# Patient Record
Sex: Female | Born: 1990 | Race: Black or African American | Hispanic: No | Marital: Single | Smoking: Never smoker
Health system: Southern US, Community
[De-identification: ages and names within clinical notes are randomized; demographics above are authoritative.]

## PROBLEM LIST (undated history)

## (undated) ENCOUNTER — Inpatient Hospital Stay (HOSPITAL_COMMUNITY): Payer: Self-pay

## (undated) DIAGNOSIS — J45909 Unspecified asthma, uncomplicated: Secondary | ICD-10-CM

## (undated) DIAGNOSIS — R112 Nausea with vomiting, unspecified: Secondary | ICD-10-CM

## (undated) DIAGNOSIS — K8020 Calculus of gallbladder without cholecystitis without obstruction: Secondary | ICD-10-CM

## (undated) DIAGNOSIS — Z9889 Other specified postprocedural states: Secondary | ICD-10-CM

## (undated) HISTORY — DX: Calculus of gallbladder without cholecystitis without obstruction: K80.20

## (undated) HISTORY — PX: WISDOM TOOTH EXTRACTION: SHX21

---
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
## 2011-10-11 DIAGNOSIS — IMO0002 Reserved for concepts with insufficient information to code with codable children: Secondary | ICD-10-CM | POA: Insufficient documentation

## 2011-10-11 DIAGNOSIS — Z880 Allergy status to penicillin: Secondary | ICD-10-CM | POA: Insufficient documentation

## 2011-10-11 DIAGNOSIS — M542 Cervicalgia: Secondary | ICD-10-CM | POA: Insufficient documentation

## 2011-10-11 DIAGNOSIS — R51 Headache: Secondary | ICD-10-CM | POA: Insufficient documentation

## 2011-10-11 DIAGNOSIS — J45909 Unspecified asthma, uncomplicated: Secondary | ICD-10-CM | POA: Insufficient documentation

## 2011-10-12 ENCOUNTER — Emergency Department (HOSPITAL_COMMUNITY)
Admission: EM | Admit: 2011-10-12 | Discharge: 2011-10-12 | Disposition: A | Discharge status: Home / Self Care | Payer: No Typology Code available for payment source | Attending: Emergency Medicine | Admitting: Emergency Medicine

## 2011-10-12 ENCOUNTER — Emergency Department (HOSPITAL_COMMUNITY)
Admit: 2011-10-12 | Discharge: 2011-10-12 | Disposition: A | Discharge status: Home / Self Care | Payer: No Typology Code available for payment source | Attending: Emergency Medicine | Admitting: Emergency Medicine

## 2011-10-12 ENCOUNTER — Encounter (HOSPITAL_COMMUNITY): Payer: Self-pay | Admitting: Emergency Medicine

## 2011-10-12 DIAGNOSIS — S0083XA Contusion of other part of head, initial encounter: Secondary | ICD-10-CM

## 2011-10-12 DIAGNOSIS — IMO0002 Reserved for concepts with insufficient information to code with codable children: Secondary | ICD-10-CM

## 2011-10-12 HISTORY — DX: Unspecified asthma, uncomplicated: J45.909

## 2011-10-12 MED ORDER — IBUPROFEN 600 MG PO TABS: 600.0000 mg | ORAL_TABLET | Freq: Four times a day (QID) | ORAL | Status: AC | PRN

## 2011-10-12 MED ORDER — IBUPROFEN 400 MG PO TABS
600.0000 mg | ORAL_TABLET | Freq: Once | ORAL | Status: AC
  Administered 2011-10-12: 600 mg via ORAL
  Filled 2011-10-12: qty 1
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
## 2011-10-12 NOTE — ED Provider Notes (Signed)
Medical screening examination/treatment/procedure(s) were performed by non-physician practitioner and as supervising physician I was immediately available for consultation/collaboration.   Hanley Seamen, MD 10/12/11 [phone]

## 2011-10-14 ENCOUNTER — Encounter (HOSPITAL_COMMUNITY): Payer: Self-pay | Admitting: *Deleted

## 2011-10-14 ENCOUNTER — Emergency Department (INDEPENDENT_AMBULATORY_CARE_PROVIDER_SITE_OTHER)
Admission: EM | Admit: 2011-10-14 | Discharge: 2011-10-14 | Disposition: A | Discharge status: Home / Self Care | Payer: BC Managed Care – PPO | Source: Home / Self Care | Attending: Emergency Medicine | Admitting: Emergency Medicine

## 2011-10-14 DIAGNOSIS — K120 Recurrent oral aphthae: Secondary | ICD-10-CM

## 2011-10-14 DIAGNOSIS — S39012A Strain of muscle, fascia and tendon of lower back, initial encounter: Secondary | ICD-10-CM

## 2011-10-14 DIAGNOSIS — S335XXA Sprain of ligaments of lumbar spine, initial encounter: Secondary | ICD-10-CM

## 2011-10-14 MED ORDER — TIZANIDINE HCL 4 MG PO TABS: 4.0000 mg | ORAL_TABLET | Freq: Two times a day (BID) | ORAL | Status: AC

## 2011-10-14 MED ORDER — DIPHENHYDRAMINE HCL 12.5 MG/5ML PO LIQD: 12.5000 mg | Freq: Four times a day (QID) | ORAL | Status: DC | PRN

## 2011-10-14 MED ORDER — HYDROCODONE-ACETAMINOPHEN 5-500 MG PO TABS: 1.0000 | ORAL_TABLET | Freq: Four times a day (QID) | ORAL | Status: DC | PRN

## 2011-10-14 MED ORDER — LIDOCAINE VISCOUS 2 % MT SOLN: 10.0000 mL | Freq: Three times a day (TID) | OROMUCOSAL | Status: AC | PRN

## 2011-10-14 MED ORDER — ALUMINUM & MAGNESIUM HYDROXIDE 600-300 MG/5ML PO CONC: ORAL | Status: AC
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
## 2011-12-06 ENCOUNTER — Encounter (HOSPITAL_COMMUNITY): Payer: Self-pay | Admitting: Emergency Medicine

## 2011-12-06 ENCOUNTER — Emergency Department (HOSPITAL_COMMUNITY)
Admission: EM | Admit: 2011-12-06 | Discharge: 2011-12-06 | Disposition: A | Discharge status: Home / Self Care | Payer: BC Managed Care – PPO | Source: Home / Self Care | Attending: Emergency Medicine | Admitting: Emergency Medicine

## 2011-12-06 DIAGNOSIS — L509 Urticaria, unspecified: Secondary | ICD-10-CM

## 2011-12-06 MED ORDER — METHYLPREDNISOLONE ACETATE 80 MG/ML IJ SUSP
INTRAMUSCULAR | Status: AC
  Filled 2011-12-06: qty 1

## 2011-12-06 MED ORDER — DIPHENHYDRAMINE HCL 50 MG/ML IJ SOLN
50.0000 mg | Freq: Once | INTRAMUSCULAR | Status: AC
  Administered 2011-12-06: 50 mg via INTRAMUSCULAR

## 2011-12-06 MED ORDER — FEXOFENADINE HCL 180 MG PO TABS: 180.0000 mg | ORAL_TABLET | Freq: Every day | ORAL | Status: DC

## 2011-12-06 MED ORDER — DIPHENHYDRAMINE HCL 50 MG/ML IJ SOLN
INTRAMUSCULAR | Status: AC
  Filled 2011-12-06: qty 1

## 2011-12-06 MED ORDER — CIMETIDINE 400 MG PO TABS: 400.0000 mg | ORAL_TABLET | Freq: Two times a day (BID) | ORAL | Status: DC

## 2011-12-06 MED ORDER — PREDNISONE 10 MG PO TABS: ORAL_TABLET | ORAL | Status: DC

## 2011-12-06 MED ORDER — METHYLPREDNISOLONE ACETATE 80 MG/ML IJ SUSP
80.0000 mg | Freq: Once | INTRAMUSCULAR | Status: AC
  Administered 2011-12-06: 80 mg via INTRAMUSCULAR
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
## 2011-12-06 NOTE — ED Notes (Signed)
Pt c/o rash x2 days... Sx include: rash on lower back, lower abd, knees, and arms... Denies: fevers, vomiting, nausea, diarrhea... Does not recall any new personal hygiene products, did not eat anything new... Pt is alert w/some discomfort due to the itching... Pt is sitting up right on exam table.

## 2012-03-05 ENCOUNTER — Emergency Department (HOSPITAL_COMMUNITY)
Admission: EM | Admit: 2012-03-05 | Discharge: 2012-03-05 | Disposition: A | Discharge status: Home / Self Care | Payer: BC Managed Care – PPO | Source: Home / Self Care

## 2012-03-05 ENCOUNTER — Encounter (HOSPITAL_COMMUNITY): Payer: Self-pay | Admitting: Emergency Medicine

## 2012-03-05 DIAGNOSIS — J020 Streptococcal pharyngitis: Secondary | ICD-10-CM

## 2012-03-05 MED ORDER — DEXAMETHASONE SODIUM PHOSPHATE 10 MG/ML IJ SOLN
10.0000 mg | Freq: Once | INTRAMUSCULAR | Status: AC
  Administered 2012-03-05: 10 mg via INTRAMUSCULAR

## 2012-03-05 MED ORDER — AZITHROMYCIN 250 MG PO TABS: 250.0000 mg | ORAL_TABLET | Freq: Every day | ORAL | Status: DC

## 2012-03-05 MED ORDER — DEXAMETHASONE SODIUM PHOSPHATE 10 MG/ML IJ SOLN
INTRAMUSCULAR | Status: AC
  Filled 2012-03-05: qty 1

## 2012-03-05 NOTE — ED Provider Notes (Signed)
History     CSN: [id number]  Arrival date & time 03/05/12  1105   None     Chief Complaint  Patient presents with  . Sore Throat    sore throat since monday. dry nonproductive cough. voice goes in and out.     HPI: Patient is a 22 y.o. female presenting with pharyngitis. The history is provided by the patient.  Sore Throat This is a recurrent problem. The current episode started more than 2 days ago. The problem occurs constantly. The problem has been gradually worsening. Pertinent negatives include no chest pain and no shortness of breath. The symptoms are aggravated by swallowing. Nothing relieves the symptoms.  Pt reports onset of sore throat Monday that has gradually worsened. Other symptoms include mild dry cough and intermittent runny nose. Denies fever. Admits to h/o strep throat.  Past Medical History  Diagnosis Date  . Asthma     Past Surgical History  Procedure Date  . Wisdom tooth extraction     Family History  Problem Relation Age of Onset  . Diabetes Mother   . Hypertension Mother   . Heart failure Mother     History  Substance Use Topics  . Smoking status: Never Smoker   . Smokeless tobacco: Not on file  . Alcohol Use: No    OB History    Grav Para Term Preterm Abortions TAB SAB Ect Mult Living                  Review of Systems  Constitutional: Negative.   HENT: Positive for congestion and rhinorrhea. Negative for ear pain, facial swelling, neck stiffness and ear discharge.   Eyes: Negative for pain, discharge and redness.  Respiratory: Positive for cough. Negative for chest tightness, shortness of breath, wheezing and stridor.   Cardiovascular: Negative for chest pain.  Gastrointestinal: Negative.   Genitourinary: Negative.   Musculoskeletal: Negative.   Neurological: Negative.   Hematological: Negative.   Psychiatric/Behavioral: Negative.     Allergies  Penicillins  Home Medications   Current Outpatient Rx  Name  Route  Sig   Dispense  Refill  . CIMETIDINE 400 MG PO TABS   Oral   Take 1 tablet (400 mg total) by mouth 2 (two) times daily.   30 tablet   0   . DIPHENHYDRAMINE HCL 12.5 MG/5ML PO LIQD   Oral   Take 5 mLs (12.5 mg total) by mouth 4 (four) times daily as needed. Mix 5 mL with 5 mL of Mylanta. Hold in mouth and swallow. Take the 10 mL q 4-6 hr prn pain   120 mL   0   . FEXOFENADINE HCL 180 MG PO TABS   Oral   Take 1 tablet (180 mg total) by mouth daily.   15 tablet   0   . HYDROCODONE-ACETAMINOPHEN 5-500 MG PO TABS   Oral   Take 1 tablet by mouth every 6 (six) hours as needed. For dental pain   20 tablet   0   . PREDNISONE 10 MG PO TABS      Take 4 tabs daily for 4 days, 3 tabs daily for 4 days, 2 tabs daily for 4 days, then 1 tab daily for 4 days.   40 tablet   0     BP 110/68  Pulse 82  Temp 98.3 F (36.8 C) (Oral)  SpO2 100%  Physical Exam  Constitutional: She is oriented to person, place, and time. She appears well-developed and well-nourished.  HENT:  Head: Normocephalic and atraumatic.  Right Ear: Tympanic membrane, external ear and ear canal normal.  Left Ear: Tympanic membrane, external ear and ear canal normal.  Nose: Right sinus exhibits no maxillary sinus tenderness and no frontal sinus tenderness. Left sinus exhibits no maxillary sinus tenderness and no frontal sinus tenderness.  Mouth/Throat: Mucous membranes are normal. Posterior oropharyngeal edema and posterior oropharyngeal erythema present. No oropharyngeal exudate.       Tonsils 2-3+ bil  Eyes: Conjunctivae normal are normal.  Neck: Neck supple.  Cardiovascular: Normal rate.   Pulmonary/Chest: Effort normal and breath sounds normal.  Musculoskeletal: Normal range of motion.  Lymphadenopathy:    She has no cervical adenopathy.  Neurological: She is alert and oriented to person, place, and time.  Skin: Skin is warm and dry.  Psychiatric: She has a normal mood and affect.    ED Course  Procedures  (including critical care time)   Labs Reviewed  POCT RAPID STREP A ([facility] ONLY)   No results found.   No diagnosis found.    MDM  Sore throat since Monday. H/o strep pharyngitis. Rapid strep positive. PCN allergy. Z-Pak and Dexamethazone IM.        Leanne Chang, NP 03/05/12 1157

## 2012-03-05 NOTE — ED Notes (Signed)
Pt c/o sore throat with a dry non productive cough since Monday. Pt states that her voices goes in and out. Pt denies fever, n/v/d. Pt has been taking nyquill with no relief in symptoms.

## 2012-03-07 NOTE — ED Provider Notes (Signed)
Medical screening examination/treatment/procedure(s) were performed by resident physician or non-physician practitioner and as supervising physician I was immediately available for consultation/collaboration.   Turkessa Ostrom DOUGLAS MD.    Makya Phillis D Makalah Asberry, MD 03/07/12 1843 

## 2012-12-14 ENCOUNTER — Emergency Department (INDEPENDENT_AMBULATORY_CARE_PROVIDER_SITE_OTHER)
Admission: EM | Admit: 2012-12-14 | Discharge: 2012-12-14 | Disposition: A | Discharge status: Home / Self Care | Payer: No Typology Code available for payment source | Source: Home / Self Care | Attending: Family Medicine | Admitting: Family Medicine

## 2012-12-14 ENCOUNTER — Emergency Department (INDEPENDENT_AMBULATORY_CARE_PROVIDER_SITE_OTHER): Payer: No Typology Code available for payment source

## 2012-12-14 ENCOUNTER — Encounter (HOSPITAL_COMMUNITY): Payer: Self-pay | Admitting: Emergency Medicine

## 2012-12-14 DIAGNOSIS — R002 Palpitations: Secondary | ICD-10-CM

## 2012-12-14 DIAGNOSIS — F411 Generalized anxiety disorder: Secondary | ICD-10-CM

## 2012-12-14 DIAGNOSIS — F419 Anxiety disorder, unspecified: Secondary | ICD-10-CM

## 2012-12-14 MED ORDER — ALPRAZOLAM 0.25 MG PO TABS: 0.2500 mg | ORAL_TABLET | Freq: Three times a day (TID) | ORAL | Status: DC | PRN

## 2012-12-14 NOTE — ED Notes (Addendum)
Pt c/o chest pain/discomfort onset yest night Sxs include: nausea, SOB, palpitations, pain that increased when lying on her left side Denies: f/v/d, weakness, dizziness, HA Recently got over an ear infection.... Took her last antibiotic this am. Reports she's feeling much better than yest night.  She is alert and talking in complete sentences w/no signs of acute distress.
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
## 2012-12-20 NOTE — ED Provider Notes (Signed)
Medical screening examination/treatment/procedure(s) were performed by resident physician or non-physician practitioner and as supervising physician I was immediately available for consultation/collaboration.   Barkley Bruns MD.   Linna Hoff, MD 12/20/12 [phone]

## 2013-01-23 ENCOUNTER — Encounter (HOSPITAL_COMMUNITY): Payer: Self-pay | Admitting: Emergency Medicine

## 2013-01-23 ENCOUNTER — Emergency Department (INDEPENDENT_AMBULATORY_CARE_PROVIDER_SITE_OTHER)
Admission: EM | Admit: 2013-01-23 | Discharge: 2013-01-23 | Disposition: A | Discharge status: Home / Self Care | Payer: No Typology Code available for payment source | Source: Home / Self Care | Attending: Emergency Medicine | Admitting: Emergency Medicine

## 2013-01-23 ENCOUNTER — Other Ambulatory Visit (HOSPITAL_COMMUNITY)
Admission: RE | Admit: 2013-01-23 | Discharge: 2013-01-23 | Disposition: A | Discharge status: Home / Self Care | Payer: No Typology Code available for payment source | Source: Ambulatory Visit | Attending: Emergency Medicine | Admitting: Emergency Medicine

## 2013-01-23 DIAGNOSIS — Z113 Encounter for screening for infections with a predominantly sexual mode of transmission: Secondary | ICD-10-CM | POA: Insufficient documentation

## 2013-01-23 DIAGNOSIS — N898 Other specified noninflammatory disorders of vagina: Secondary | ICD-10-CM

## 2013-01-23 DIAGNOSIS — N939 Abnormal uterine and vaginal bleeding, unspecified: Secondary | ICD-10-CM

## 2013-01-23 DIAGNOSIS — N760 Acute vaginitis: Secondary | ICD-10-CM | POA: Insufficient documentation

## 2013-01-23 LAB — POCT PREGNANCY, URINE: Preg Test, Ur: NEGATIVE

## 2013-01-23 NOTE — ED Provider Notes (Signed)
Medical screening examination/treatment/procedure(s) were performed by a resident physician and as supervising physician I was immediately available for consultation/collaboration.  Additionally, I saw the patient independently, verified the history, examined the patient and discussed the treatment plan with the resident.  Leslee Home, M.D.   Reuben Likes, MD 01/23/13 [phone]

## 2013-01-23 NOTE — ED Notes (Signed)
C/o vaginal bleeding since last Thursday. Cramping at night and with lying down.  Nausea off/on for several weeks.  Uses depo for birth control states never had cycle with injection.  Denies any abnormal discharge and irritation.

## 2013-01-23 NOTE — ED Provider Notes (Signed)
CSN: [id number]     Arrival date & time 01/23/13  1322 History   First MD Initiated Contact with Patient 01/23/13 1438     Chief Complaint  Patient presents with  . Vaginal Bleeding   HPI 22 yo female who presents for evaluation of vaginal bleeding. Started on Friday (4 days ago) and has been spotting once or twice a day. She has had associated abdominal cramping and nausea. She denies any vaginal discharge, vaginal burning or itching. Denies any dysuria or increased urinary frequency. No fevers or chills. She denies any STD exposure. Denies any recent vaginal trauma.  She has been on depo provera for the last 4-5 years and has received it regularly without intervals of discontinuation. Her last depo shot was 3 months ago. She is due for her next dose this month. She has never had vaginal spotting or bleeding on depo before. Prior to receiving the depo shot, she used to have monthly, regular periods.   Past Medical History  Diagnosis Date  . Asthma    Past Surgical History  Procedure Laterality Date  . Wisdom tooth extraction     Family History  Problem Relation Age of Onset  . Diabetes Mother   . Hypertension Mother   . Heart failure Mother    History  Substance Use Topics  . Smoking status: Never Smoker   . Smokeless tobacco: Not on file  . Alcohol Use: No   OB History   Grav Para Term Preterm Abortions TAB SAB Ect Mult Living                 Review of Systems Negative except per HPI Allergies  Penicillins  Home Medications   Current Outpatient Rx  Name  Route  Sig  Dispense  Refill  . ALPRAZolam (XANAX) 0.25 MG tablet   Oral   Take 1 tablet (0.25 mg total) by mouth 3 (three) times daily as needed for sleep.   30 tablet   0    BP 135/78  Pulse 101  Temp(Src) 98 F (36.7 C) (Oral)  Resp 16  SpO2 99% Physical Exam General: no acute distress, pleasant, alert and oriented x4 Pelvic exam: normal external genitalia, vulva, vagina, cervix, uterus and adnexa. No  cervical motion tenderness, no adnexal tenderness, normal sized uterus.  Abdomen: soft, non distended, mild discomfort in epigastric region, otherwise no tenderness, no guarding, no rebound.  ED Course  Procedures (including critical care time) Labs Review Labs Reviewed  POCT PREGNANCY, URINE  CERVICOVAGINAL ANCILLARY ONLY    MDM   1. Vaginal bleeding    Spotting likely from depo-provera: Infectious etiology being ruled out with GC/Chl and wet prep. No evidence of PID on exam. Normal sized uterus making a grossly enlarged fibroid as the cause of bleeding less likely. UPT negative.  - Spotting will likely resolve on its own. Patient did not want provera treatment for bleeding. Patient to receive her depo as schedule. - over the counter ibuprofen for abdominal cramping - red flags for return to care reviewed: worsening vaginal bleeding, abdominal pain, fevers, poor po intake. - She was advised to return to her family medicine doctor in 1-2 weeks for close follow up.     Lonia Skinner, MD 01/23/13 [phone]

## 2013-01-25 ENCOUNTER — Telehealth (HOSPITAL_COMMUNITY): Payer: Self-pay | Admitting: Emergency Medicine

## 2013-01-25 ENCOUNTER — Telehealth (HOSPITAL_COMMUNITY): Payer: Self-pay | Admitting: *Deleted

## 2013-01-25 ENCOUNTER — Telehealth: Payer: Self-pay | Admitting: Family Medicine

## 2013-01-25 MED ORDER — FLUCONAZOLE 150 MG PO TABS: 150.0000 mg | ORAL_TABLET | Freq: Once | ORAL | Status: DC

## 2013-01-25 NOTE — ED Notes (Signed)
I called pt. Pt. verified x 2 and given results.  Pt. told she needs Diflucan for a yeast infection and where to pick up her Rx. Vassie Moselle 01/25/2013

## 2013-01-25 NOTE — ED Notes (Signed)
Her DNA probe came back positive for Candida. She will need treatment. Prescription sent in for Diflucan 150 mg, one tablet, one time only.  Reuben Likes, MD 01/25/13 [phone]

## 2013-01-25 NOTE — Telephone Encounter (Signed)
Called patient to let her know she had yeast infection and she had already been notified by urgent care RN.   Marena Chancy, PGY-3 Family Medicine Resident

## 2013-10-03 ENCOUNTER — Emergency Department (HOSPITAL_COMMUNITY)
Admission: EM | Admit: 2013-10-03 | Discharge: 2013-10-04 | Disposition: A | Discharge status: Home / Self Care | Payer: No Typology Code available for payment source | Attending: Emergency Medicine | Admitting: Emergency Medicine

## 2013-10-03 ENCOUNTER — Encounter (HOSPITAL_COMMUNITY): Payer: Self-pay | Admitting: Emergency Medicine

## 2013-10-03 DIAGNOSIS — IMO0001 Reserved for inherently not codable concepts without codable children: Secondary | ICD-10-CM | POA: Insufficient documentation

## 2013-10-03 DIAGNOSIS — J45909 Unspecified asthma, uncomplicated: Secondary | ICD-10-CM | POA: Diagnosis not present

## 2013-10-03 DIAGNOSIS — J029 Acute pharyngitis, unspecified: Secondary | ICD-10-CM | POA: Insufficient documentation

## 2013-10-03 DIAGNOSIS — Z880 Allergy status to penicillin: Secondary | ICD-10-CM | POA: Diagnosis not present

## 2013-10-03 LAB — RAPID STREP SCREEN (MED CTR MEBANE ONLY): STREPTOCOCCUS, GROUP A SCREEN (DIRECT): NEGATIVE

## 2013-10-03 NOTE — ED Provider Notes (Signed)
CSN: [id number]     Arrival date & time 10/03/13  2200 History  This chart was scribed for non-physician practitioner working with Richardean Canalavid H Yao, MD by Elveria Risingimelie Horne, ED Scribe. This patient was seen in room TR09C/TR09C and the patient's care was started at 11:07 PM.   Chief Complaint  Patient presents with  . Sore Throat    The history is provided by the patient. No language interpreter was used.   HPI Comments: Janice White is a 23 y.o. female who presents to the Emergency Department complaining of sore throat with hoarse voice, for three days. Patient reports associated rhinorrhea and myalgias. Patient reports history of strep; she states that her current symptoms are similar.  She denies sick contacts at home.  She reports attempted treatment with Theraflu, salt water gargles, and hot tea without relief.   Past Medical History  Diagnosis Date  . Asthma    Past Surgical History  Procedure Laterality Date  . Wisdom tooth extraction     Family History  Problem Relation Age of Onset  . Diabetes Mother   . Hypertension Mother   . Heart failure Mother    History  Substance Use Topics  . Smoking status: Never Smoker   . Smokeless tobacco: Not on file  . Alcohol Use: No   OB History   Grav Para Term Preterm Abortions TAB SAB Ect Mult Living                 Review of Systems  Constitutional: Negative for fever and chills.  HENT: Positive for rhinorrhea, sore throat and voice change. Negative for trouble swallowing.   Gastrointestinal: Negative for nausea and vomiting.  Musculoskeletal: Positive for myalgias.  All other systems reviewed and are negative.   Allergies  Penicillins  Home Medications   Prior to Admission medications   Medication Sig Start Date End Date Taking? Authorizing Provider  Phenylephrine-Pheniramine-DM [facility](THERAFLU COLD & COUGH PO) Take 1 packet by mouth daily as needed (cough / sore throat / cold).   Yes Historical Provider, MD   Triage Vitals:  BP 129/73  Pulse 94  Temp(Src) 99.2 F (37.3 C) (Oral)  Resp 18  Ht 5' (1.524 m)  Wt 130 lb (58.968 kg)  BMI 25.39 kg/m2  SpO2 100%  Physical Exam  Nursing note and vitals reviewed. Constitutional: She is oriented to person, place, and time. She appears well-developed and well-nourished.  HENT:  Head: Normocephalic and atraumatic.  Oropharyngeal exudate.  Mild post oropharyngeal erythema.   Eyes: EOM are normal.  Neck: Neck supple.  Cardiovascular: Normal rate.   Pulmonary/Chest: Effort normal.  Musculoskeletal: Normal range of motion.  Neurological: She is alert and oriented to person, place, and time.  Skin: Skin is warm and dry.  Psychiatric: She has a normal mood and affect. Her behavior is normal.    ED Course  Procedures (including critical care time)  COORDINATION OF CARE: 11:10 PM- Discussed treatment plan with patient at bedside and patient agreed to plan.   Labs Review Labs Reviewed - No data to display  Imaging Review No results found.   EKG Interpretation None     Strep screen negative.  Routine culture pending. MDM   Final diagnoses:  None    Pharyngitis, likely viral. Care instructions provided.  Patient will be contacted if routine culture indicates additional treatment is needed.  I personally performed the services described in this documentation, which was scribed in my presence. The recorded information has been reviewed  and is accurate.    Jimmye Norman, NP 10/04/13 [phone]

## 2013-10-03 NOTE — ED Notes (Signed)
Pt reprots sore throat, runny nose and body aches x 3 days. She has been taking Theraflu at home.

## 2013-10-03 NOTE — ED Notes (Signed)
NP at the bedside

## 2013-10-04 NOTE — ED Provider Notes (Signed)
Medical screening examination/treatment/procedure(s) were performed by non-physician practitioner and as supervising physician I was immediately available for consultation/collaboration.   EKG Interpretation None        Richardean Canalavid H Shantina Chronister, MD 10/04/13 1046

## 2013-10-04 NOTE — Discharge Instructions (Signed)

## 2013-10-05 LAB — CULTURE, GROUP A STREP

## 2013-12-26 ENCOUNTER — Emergency Department (HOSPITAL_COMMUNITY)
Admission: EM | Admit: 2013-12-26 | Discharge: 2013-12-26 | Disposition: A | Discharge status: Home / Self Care | Payer: No Typology Code available for payment source | Attending: Emergency Medicine | Admitting: Emergency Medicine

## 2013-12-26 ENCOUNTER — Encounter (HOSPITAL_COMMUNITY): Payer: Self-pay | Admitting: Emergency Medicine

## 2013-12-26 ENCOUNTER — Encounter (HOSPITAL_COMMUNITY): Payer: Self-pay | Admitting: *Deleted

## 2013-12-26 DIAGNOSIS — Z3202 Encounter for pregnancy test, result negative: Secondary | ICD-10-CM | POA: Diagnosis not present

## 2013-12-26 DIAGNOSIS — J45909 Unspecified asthma, uncomplicated: Secondary | ICD-10-CM | POA: Diagnosis not present

## 2013-12-26 DIAGNOSIS — R1030 Lower abdominal pain, unspecified: Secondary | ICD-10-CM | POA: Diagnosis not present

## 2013-12-26 DIAGNOSIS — R1010 Upper abdominal pain, unspecified: Secondary | ICD-10-CM | POA: Insufficient documentation

## 2013-12-26 DIAGNOSIS — Z79899 Other long term (current) drug therapy: Secondary | ICD-10-CM | POA: Insufficient documentation

## 2013-12-26 DIAGNOSIS — L739 Follicular disorder, unspecified: Secondary | ICD-10-CM | POA: Diagnosis not present

## 2013-12-26 DIAGNOSIS — Z880 Allergy status to penicillin: Secondary | ICD-10-CM | POA: Insufficient documentation

## 2013-12-26 DIAGNOSIS — Z792 Long term (current) use of antibiotics: Secondary | ICD-10-CM | POA: Diagnosis not present

## 2013-12-26 DIAGNOSIS — R110 Nausea: Secondary | ICD-10-CM | POA: Insufficient documentation

## 2013-12-26 DIAGNOSIS — M85651 Other cyst of bone, right thigh: Secondary | ICD-10-CM | POA: Diagnosis present

## 2013-12-26 LAB — WET PREP, GENITAL: TRICH WET PREP: NONE SEEN

## 2013-12-26 LAB — CBC WITH DIFFERENTIAL/PLATELET
BASOS PCT: 0 % (ref 0–1)
Basophils Absolute: 0 10*3/uL (ref 0.0–0.1)
Eosinophils Absolute: 0.1 10*3/uL (ref 0.0–0.7)
Eosinophils Relative: 1 % (ref 0–5)
HCT: 39.1 % (ref 36.0–46.0)
Hemoglobin: 12.9 g/dL (ref 12.0–15.0)
Lymphocytes Relative: 29 % (ref 12–46)
Lymphs Abs: 3.1 10*3/uL (ref 0.7–4.0)
MCH: 26.9 pg (ref 26.0–34.0)
MCHC: 33 g/dL (ref 30.0–36.0)
MCV: 81.5 fL (ref 78.0–100.0)
MONOS PCT: 9 % (ref 3–12)
Monocytes Absolute: 1 10*3/uL (ref 0.1–1.0)
NEUTROS ABS: 6.7 10*3/uL (ref 1.7–7.7)
NEUTROS PCT: 61 % (ref 43–77)
Platelets: 243 10*3/uL (ref 150–400)
RBC: 4.8 MIL/uL (ref 3.87–5.11)
RDW: 13.9 % (ref 11.5–15.5)
WBC: 10.9 10*3/uL — ABNORMAL HIGH (ref 4.0–10.5)

## 2013-12-26 LAB — COMPREHENSIVE METABOLIC PANEL
ALK PHOS: 66 U/L (ref 39–117)
ALT: 13 U/L (ref 0–35)
AST: 19 U/L (ref 0–37)
Albumin: 4.2 g/dL (ref 3.5–5.2)
Anion gap: 14 (ref 5–15)
BILIRUBIN TOTAL: 0.2 mg/dL — AB (ref 0.3–1.2)
BUN: 12 mg/dL (ref 6–23)
CHLORIDE: 104 meq/L (ref 96–112)
CO2: 25 mEq/L (ref 19–32)
Calcium: 10 mg/dL (ref 8.4–10.5)
Creatinine, Ser: 0.67 mg/dL (ref 0.50–1.10)
GFR calc Af Amer: >90 mL/min (ref >90)
GFR calc non Af Amer: >90 mL/min (ref >90)
Glucose, Bld: 104 mg/dL — ABNORMAL HIGH (ref 70–99)
POTASSIUM: 4.7 meq/L (ref 3.7–5.3)
SODIUM: 143 meq/L (ref 137–147)
Total Protein: 7.6 g/dL (ref 6.0–8.3)

## 2013-12-26 LAB — URINALYSIS, ROUTINE W REFLEX MICROSCOPIC
BILIRUBIN URINE: NEGATIVE
GLUCOSE, UA: NEGATIVE mg/dL
Hgb urine dipstick: NEGATIVE
Ketones, ur: NEGATIVE mg/dL
Leukocytes, UA: NEGATIVE
Nitrite: NEGATIVE
PH: 6.5 (ref 5.0–8.0)
Protein, ur: NEGATIVE mg/dL
Specific Gravity, Urine: 1.024 (ref 1.005–1.030)
Urobilinogen, UA: 0.2 mg/dL (ref 0.0–1.0)

## 2013-12-26 LAB — PREGNANCY, URINE: Preg Test, Ur: NEGATIVE

## 2013-12-26 MED ORDER — SULFAMETHOXAZOLE-TRIMETHOPRIM 800-160 MG PO TABS: 1.0000 | ORAL_TABLET | Freq: Two times a day (BID) | ORAL | Status: DC

## 2013-12-26 MED ORDER — ONDANSETRON 8 MG PO TBDP: 8.0000 mg | ORAL_TABLET | Freq: Three times a day (TID) | ORAL | Status: DC | PRN

## 2013-12-26 MED ORDER — DICYCLOMINE HCL 10 MG PO CAPS
20.0000 mg | ORAL_CAPSULE | Freq: Once | ORAL | Status: AC
  Administered 2013-12-26: 20 mg via ORAL
  Filled 2013-12-26: qty 2

## 2013-12-26 MED ORDER — DICYCLOMINE HCL 20 MG PO TABS: 20.0000 mg | ORAL_TABLET | Freq: Four times a day (QID) | ORAL | Status: DC | PRN

## 2013-12-26 MED ORDER — FAMOTIDINE 20 MG PO TABS: 20.0000 mg | ORAL_TABLET | Freq: Two times a day (BID) | ORAL | Status: DC

## 2013-12-26 MED ORDER — ONDANSETRON 4 MG PO TBDP
8.0000 mg | ORAL_TABLET | Freq: Once | ORAL | Status: AC
  Administered 2013-12-26: 8 mg via ORAL
  Filled 2013-12-26: qty 2

## 2013-12-26 MED ORDER — IBUPROFEN 400 MG PO TABS
800.0000 mg | ORAL_TABLET | Freq: Once | ORAL | Status: AC
  Administered 2013-12-26: 800 mg via ORAL
  Filled 2013-12-26: qty 2

## 2013-12-26 NOTE — ED Notes (Signed)
Pt requesting to be discharged. Dr. Norlene Campbelltter informed and states she will have discharge paperwork ready for the patient as quickly as she can. Pt and family updated.

## 2013-12-26 NOTE — ED Notes (Signed)
Pt A&OX4, ambulatory at d/c with steady gait, NAD 

## 2013-12-26 NOTE — ED Provider Notes (Signed)
CSN: [id number]     Arrival date & time 12/26/13  1841 History   This chart was scribed for non-physician practitioner, Terri Piedraourtney Forcucci, PA-C, working with Layla MawKristen N Ward, DO, by Ronney LionSuzanne Le, ED Scribe. This patient was seen in room TR11C/TR11C and the patient's care was started at 9:30 PM.    Chief Complaint  Patient presents with  . Cyst   The history is provided by the patient. No language interpreter was used.    HPI Comments: Janice White is a 23 y.o. female who presents to the Emergency Department complaining of a cyst on her right inner thigh that began yesterday. She notes associated pain. She was seen here last night for severe abdominal cramping. Although she forgets her diagnosis, she reports she was not diagnosed with an infection, but was prescribed an unknown medication. She has taken ibuprofen for her pain, which helps a little bit.  She has not tried applying warm compresses to the area. She notes an allergy to Penicillin. She denies trauma, discharge, erythema, or warmth to the area. She also denies nausea, chills, fever, and vomiting, dysuria, diarrhea, or constipation. She also denies a history of similar symptoms.     Past Medical History  Diagnosis Date  . Asthma    Past Surgical History  Procedure Laterality Date  . Wisdom tooth extraction     Family History  Problem Relation Age of Onset  . Diabetes Mother   . Hypertension Mother   . Heart failure Mother    History  Substance Use Topics  . Smoking status: Never Smoker   . Smokeless tobacco: Not on file  . Alcohol Use: No   OB History    No data available     Review of Systems  All other systems reviewed and are negative.     Allergies  Penicillins  Home Medications   Prior to Admission medications   Medication Sig Start Date End Date Taking? Authorizing Provider  dicyclomine (BENTYL) 20 MG tablet Take 1 tablet (20 mg total) by mouth every 6 (six) hours as needed for spasms (for abdominal  cramping). 12/26/13   Olivia Mackielga M Otter, MD  famotidine (PEPCID) 20 MG tablet Take 1 tablet (20 mg total) by mouth 2 (two) times daily. 12/26/13   Olivia Mackielga M Otter, MD  ondansetron (ZOFRAN ODT) 8 MG disintegrating tablet Take 1 tablet (8 mg total) by mouth every 8 (eight) hours as needed for nausea or vomiting. 12/26/13   Olivia Mackielga M Otter, MD  Phenylephrine-Pheniramine-DM [facility](THERAFLU COLD & COUGH PO) Take 1 packet by mouth daily as needed (cough / sore throat / cold).    Historical Provider, MD  sulfamethoxazole-trimethoprim (SEPTRA DS) 800-160 MG per tablet Take 1 tablet by mouth every 12 (twelve) hours. 12/26/13   Ellaree Gear A Forcucci, PA-C   Triage Vitals: BP 126/78 mmHg  Pulse 112  Temp(Src) 98.3 F (36.8 C) (Oral)  Resp 18  Ht 5' (1.524 m)  Wt 130 lb (58.968 kg)  BMI 25.39 kg/m2  SpO2 99%  Physical Exam  Constitutional: She is oriented to person, place, and time. She appears well-developed and well-nourished. No distress.  HENT:  Head: Normocephalic and atraumatic.  Eyes: Conjunctivae and EOM are normal.  Neck: No tracheal deviation present.  Cardiovascular: Normal rate and regular rhythm.  Exam reveals no gallop and no friction rub.   No murmur heard. 2+ femoral pulse.  Pulmonary/Chest: Effort normal and breath sounds normal. No respiratory distress. She has no wheezes. She has no rhonchi.  She has no rales.  Lungs clear to auscultation.  Musculoskeletal: Normal range of motion.  Neurological: She is alert and oriented to person, place, and time.  Skin: Skin is warm and dry.  3 x 1 cm area that is firm but without fluctuance. Tenderness to palpation.   Psychiatric: She has a normal mood and affect. Her behavior is normal.  Nursing note and vitals reviewed.   ED Course  Procedures (including critical care time)  DIAGNOSTIC STUDIES: Oxygen Saturation is 99% on room air, normal by my interpretation.    COORDINATION OF CARE: 9:56 PM-Discussed treatment plan which includes applying a warm  compress to the area, as well as an antibiotic (Bactrim) with pt at bedside and pt agreed to plan. Pt is told to return if the area becomes hot, red, and warm, or if she develops nausea, fever, or vomiting.     Labs Review Labs Reviewed - No data to display  Imaging Review No results found.   EKG Interpretation None      MDM   Final diagnoses:  Folliculitis    Patient is a 23 y.o. Female who presents with bump to inner thigh.  Physical exam reveals nodule with tenderness to palpation with no fluctuance or erythema, but firmness.  Will discharge home with bactrim and have recommended warm compresses.  Patient to return for worsening signs of infection.  Patient states understanding and agreement.  Patient is stable for discharge.    I personally performed the services described in this documentation, which was scribed in my presence. The recorded information has been reviewed and is accurate.     Eben Burowourtney A Forcucci, PA-C 12/26/13 2204  Layla MawKristen N Ward, DO 12/27/13 0006

## 2013-12-26 NOTE — ED Provider Notes (Signed)
CSN: [id number]     Arrival date & time 12/26/13  0023 History   First MD Initiated Contact with Patient 12/26/13 0155     Chief Complaint  Patient presents with  . Abdominal Pain     (Consider location/radiation/quality/duration/timing/severity/associated sxs/prior Treatment) HPI 23 year old female presents to the emergency department from home with complaint of abdominal pain and nausea.  Symptoms started Saturday.  She has had no vomiting, no diarrhea, no fever, no known sick contacts or unusual foods.  Patient reports normal bowel movement yesterday.  She denies any urinary symptoms, no vaginal discharge.  No new sexual partners.  Pain is crampy and intermittent.  No pain at present.  No medication taken prior to arrival.  Patient is on Depo, last shot was last month, October. Past Medical History  Diagnosis Date  . Asthma    Past Surgical History  Procedure Laterality Date  . Wisdom tooth extraction     Family History  Problem Relation Age of Onset  . Diabetes Mother   . Hypertension Mother   . Heart failure Mother    History  Substance Use Topics  . Smoking status: Never Smoker   . Smokeless tobacco: Not on file  . Alcohol Use: No   OB History    No data available     Review of Systems   See History of Present Illness; otherwise all other systems are reviewed and negative  Allergies  Penicillins  Home Medications   Prior to Admission medications   Medication Sig Start Date End Date Taking? Authorizing Provider  Phenylephrine-Pheniramine-DM [facility](THERAFLU COLD & COUGH PO) Take 1 packet by mouth daily as needed (cough / sore throat / cold).    Historical Provider, MD   BP 128/85 mmHg  Pulse 85  Temp(Src) 98.5 F (36.9 C) (Oral)  Resp 14  Ht 4\' 11"  (1.499 m)  Wt 136 lb (61.689 kg)  BMI 27.45 kg/m2  SpO2 99% Physical Exam  Constitutional: She is oriented to person, place, and time. She appears well-developed and well-nourished.  HENT:  Head: Normocephalic  and atraumatic.  Nose: Nose normal.  Mouth/Throat: Oropharynx is clear and moist.  Eyes: Conjunctivae and EOM are normal. Pupils are equal, round, and reactive to light.  Neck: Normal range of motion. Neck supple. No JVD present. No tracheal deviation present. No thyromegaly present.  Cardiovascular: Normal rate, regular rhythm, normal heart sounds and intact distal pulses.  Exam reveals no gallop and no friction rub.   No murmur heard. Pulmonary/Chest: Effort normal and breath sounds normal. No stridor. No respiratory distress. She has no wheezes. She has no rales. She exhibits no tenderness.  Abdominal: Soft. Bowel sounds are normal. She exhibits no distension and no mass. There is no tenderness. There is no rebound and no guarding.  Genitourinary:  External genitalia normal Vagina without discharge Cervix  normal negative for cervical motion tenderness Adnexa palpated, no masses or negative for tenderness noted Bladder palpated negative for tenderness Uterus palpated no masses or negative for tenderness    Musculoskeletal: Normal range of motion. She exhibits no edema or tenderness.  Lymphadenopathy:    She has no cervical adenopathy.  Neurological: She is alert and oriented to person, place, and time. She displays normal reflexes. She exhibits normal muscle tone. Coordination normal.  Skin: Skin is warm and dry. No rash noted. No erythema. No pallor.  Psychiatric: She has a normal mood and affect. Her behavior is normal. Judgment and thought content normal.  Nursing note and vitals  reviewed.   ED Course  Procedures (including critical care time) Labs Review Labs Reviewed  WET PREP, GENITAL - Abnormal; Notable for the following:    Yeast Wet Prep HPF POC FEW (*)    Clue Cells Wet Prep HPF POC FEW (*)    WBC, Wet Prep HPF POC FEW (*)    All other components within normal limits  CBC WITH DIFFERENTIAL - Abnormal; Notable for the following:    WBC 10.9 (*)    All other  components within normal limits  COMPREHENSIVE METABOLIC PANEL - Abnormal; Notable for the following:    Glucose, Bld 104 (*)    Total Bilirubin 0.2 (*)    All other components within normal limits  URINALYSIS, ROUTINE W REFLEX MICROSCOPIC - Abnormal; Notable for the following:    APPearance CLOUDY (*)    All other components within normal limits  GC/CHLAMYDIA PROBE AMP  PREGNANCY, URINE    Imaging Review No results found.   EKG Interpretation None      MDM   Final diagnoses:  Pain of upper abdomen     23 year old female withboth upper and lower abdominal pain for 2 days.  Labs are unremarkable.  Pelvic exam normal.  Pain seems slightly more in epigastrium.  Will have her follow-up with her primary care doctor, place her on Pepcid Zofran and Bentyl.  Patient agrees with plan.    Olivia Mackielga M Darrold Bezek, MD 12/26/13 [phone]

## 2013-12-26 NOTE — ED Notes (Signed)
Moderate size cyst on inner thigh.  No drainage. Very sore.

## 2013-12-26 NOTE — ED Notes (Signed)
Pt. reports low abdominal pain with nausea onset yesterday , denies emesis or diarrhea . No fever or chills.

## 2013-12-26 NOTE — ED Notes (Signed)
Pt. Refused Wheelchair 

## 2013-12-26 NOTE — Discharge Instructions (Signed)
Abdominal Pain °Many things can cause abdominal pain. Usually, abdominal pain is not caused by a disease and will improve without treatment. It can often be observed and treated at home. Your health care provider will do a physical exam and possibly order blood tests and X-rays to help determine the seriousness of your pain. However, in many cases, more time must pass before a clear cause of the pain can be found. Before that point, your health care provider may not know if you need more testing or further treatment. °HOME CARE INSTRUCTIONS  °Monitor your abdominal pain for any changes. The following actions may help to alleviate any discomfort you are experiencing: °· Only take over-the-counter or prescription medicines as directed by your health care provider. °· Do not take laxatives unless directed to do so by your health care provider. °· Try a clear liquid diet (broth, tea, or water) as directed by your health care provider. Slowly move to a bland diet as tolerated. °SEEK MEDICAL CARE IF: °· You have unexplained abdominal pain. °· You have abdominal pain associated with nausea or diarrhea. °· You have pain when you urinate or have a bowel movement. °· You experience abdominal pain that wakes you in the night. °· You have abdominal pain that is worsened or improved by eating food. °· You have abdominal pain that is worsened with eating fatty foods. °· You have a fever. °SEEK IMMEDIATE MEDICAL CARE IF:  °· Your pain does not go away within 2 hours. °· You keep throwing up (vomiting). °· Your pain is felt only in portions of the abdomen, such as the right side or the left lower portion of the abdomen. °· You pass bloody or black tarry stools. °MAKE SURE YOU: °· Understand these instructions.   °· Will watch your condition.   °· Will get help right away if you are not doing well or get worse.   °Document Released: 11/12/2004 Document Revised: 02/07/2013 Document Reviewed: 10/12/2012 °ExitCare® Patient Information  ©2015 [employer]. This information is not intended to replace advice given to you by your health care provider. Make sure you discuss any questions you have with your health care provider. ° ° °Emergency Department Resource Guide °1) Find a Doctor and Pay Out of Pocket °Although you won't have to find out who is covered by your insurance plan, it is a good idea to ask around and get recommendations. You will then need to call the office and see if the doctor you have chosen will accept you as a new patient and what types of options they offer for patients who are self-pay. Some doctors offer discounts or will set up payment plans for their patients who do not have insurance, but you will need to ask so you aren't surprised when you get to your appointment. ° °2) Contact Your Local Health Department °Not all health departments have doctors that can see patients for sick visits, but many do, so it is worth a call to see if yours does. If you don't know where your local health department is, you can check in your phone book. The CDC also has a tool to help you locate your state's health department, and many state websites also have listings of all of their local health departments. ° °3) Find a Walk-in Clinic °If your illness is not likely to be very severe or complicated, you may want to try a walk in clinic. These are popping up all over the country in pharmacies, drugstores, and shopping centers. They're   usually staffed by nurse practitioners or physician assistants that have been trained to treat common illnesses and complaints. They're usually fairly quick and inexpensive. However, if you have serious medical issues or chronic medical problems, these are probably not your best option. ° °No Primary Care Doctor: °- Call [facility] at  832-8000 - they can help you locate a primary care doctor that  accepts your insurance, provides certain services, etc. °- Physician Referral Service- 1-800-533-3463 ° °Chronic  Pain Problems: °Organization         Address  Phone   Notes  °[facility]  (336) 297-2271 Patients need to be referred by their primary care doctor.  ° °Medication Assistance: °Organization         Address  Phone   Notes  °[place] [facility] [address] °[place], [place] [address] (336) 641-8030 --Must be a resident of [place] °-- Must have NO insurance coverage whatsoever (no Medicaid/ Medicare, etc.) °-- The pt. MUST have a primary care doctor that directs their care regularly and follows them in the community °  °[facility]  (866) 331-1348   °[employer]  (888) 892-1162   ° °Agencies that provide inexpensive medical care: °Organization         Address  Phone   Notes  °[facility]  (336) 832-8035   °[facility]    (336) 832-7272   °[facility] [address] °[place], [place] [address] (336) 832-4777   °[facility] [address], °[place] (336) 271-4999   °[facility]    (336) 373-0678   °[facility]    (336) 272-1050   °[facility] ° [address], [place] Phone:  (336) 832-4444, Fax:  (336) 832-4440 Hours of Operation:  9 am - 6 pm, M-F.  Also accepts Medicaid/Medicare and self-pay.  ° [facility] ° [address], [place] Phone: (336) 832-3150, Fax: (336) 832-3151. Hours of Operation:  8:30 am - 5:30 pm, M-F.  Also accepts Medicaid and self-pay.  °[facility] [place] [address], [place] Phone: (336) 878-6027   °[facility] [address], [place], [place] (336)723-1848, Ext. 123 Mondays & Thursdays: 7-9 AM.  First 15 patients are seen on a first come, first serve basis. °  ° °Medicaid-accepting [place] Providers: ° °Organization         Address  Phone   Notes  °[facility] [address], [place] (336) 641-2100 Also  accepts self-pay patients.  °[facility] [address], [place] ° (336) 856-9996   °[facility] [address], [place] (336) 288-8857   °[facility] [address] [place] [address], [place] (336) 299-7000   °Veita Bland [address], [place]  ° (336) 373-1557 Only accepts [place] Access Medicaid patients after they have their name applied to their card.  ° °Self-Pay (no insurance) in [place]: ° °Organization         Address  Phone   Notes  °Sickle Cell Patients, [facility] [address], [place] (336) 832-1970   °[facility] [address], [place] (336) 832-4400   °[facility] ° [address], [place] (336) 992-4800   °[facility]/Dr. Osei-Bonsu ° [address] [place] [address], [place] or [address], [place] (336) 841-8500 Phone number for both [place] and [place] locations is the same.  °[facility] [address], [place] (336) 299-0000   °[facility] [address] [place] [address], [place] or [address] (336) 852-7530 °(336) 878-2260   °[facility] [address], [place] (336) 350-1642, phone; (336) 294-5005, fax Sees patients 1st and 3rd Saturday of every month.  Must not qualify for public or private insurance (i.e. Medicaid, Medicare, [place] Health Choice, Veterans' Benefits) • Household income should be no more than 200% of the poverty level •The clinic cannot treat you if you are pregnant or think you are pregnant • Sexually transmitted diseases are not treated at the clinic.  ° ° °Dental Care: °Organization         Address  Phone  Notes  °[facility] [address], [place] (336) 641-6152 Accepts children up to age 21 who are enrolled in Medicaid or [place] Health Choice; pregnant  women with a Medicaid card; and children who have applied for Medicaid or [place] Health Choice, but were declined, whose parents can pay a reduced fee at time of service.  °[facility] [place]  [address], [place] (336) 641-7733 Accepts children up to age 21 who are enrolled in Medicaid or [place] Health Choice; pregnant women with a Medicaid card; and children who have applied for Medicaid or [place] Health Choice, but were declined, whose parents can pay a reduced fee at time of service.  °[facility] ° [address], [place] (336) 641-4533 Patients are seen by appointment only. Walk-ins are not accepted. [facility] will see patients 18 years of age and older. °Monday - Tuesday (8am-5pm) °Most Wednesdays (8:30-5pm) °$30 per visit, cash only  °[facility] ° [address], [place] (336) 641-4533 Patients are seen by appointment only. Walk-ins are not accepted. [facility] will see patients 18 years of age and older. °One Wednesday Evening (Monthly: Volunteer Based).  $30 per visit, cash only  °[facility]  (919) 537-3737 for adults; Children under age 4, call Graduate Pediatric Dentistry at ([phone]) [phone]. Children aged 4-14, please call (919) [phone] to request a pediatric application. ° Dental services are provided in all areas of dental care including fillings, crowns and bridges, complete and partial dentures, implants, gum treatment, root canals, and extractions. Preventive care is also provided. Treatment is provided to both adults and children. °Patients are selected via a lottery and there is often a waiting list. °  °[facility] [address], °[place] ° (336) 763-8833 [email or web address] °  °[facility] [address], [place], [place] (336)723-1848, Ext. 123 Second and Fourth Thursday of each month, opens at 6:30 AM; Clinic ends at 9 AM.  Patients are  seen on a first-come first-served basis, and a limited number are seen during each clinic.  ° °[facility] ° [address], [place], [place] (336) 723-7904   Eligibility Requirements °You must have lived in [place], or [place] counties for at least the last three months. °  You cannot be eligible for state or federal sponsored healthcare insurance, including Veterans Administration, Medicaid, or Medicare. °  You generally cannot be eligible for healthcare insurance through   your employer.  °  How to apply: °Eligibility screenings are held every Tuesday and Wednesday afternoon from 1:00 pm until 4:00 pm. You do not need an appointment for the interview!  °[facility] [address], [place], [place] 336-631-2330   °[facility]  336-342-8273   °[facility]  336-703-3100   °[facility]  336-570-6415   ° °Behavioral Health Resources in the Community: °Intensive Outpatient Programs °Organization         Address  Phone  Notes  °[facility] [address], [place], [place] 336-878-6098   °[facility] [address], [place], [place] 336-832-9800   °[facility] [address], [place], [place] ° 336-882-2125   °[facility] [address],  °[place], [place] 1-800-853-5163 or 336-641-4981   °Substance Abuse Resources °Organization         Address  Phone  Notes  °[facility]  336-882-2125   °[facility]  336-784-9470   °[facility]  336-285-9073   °[facility]  336-845-3988   °Residential & Outpatient Substance Abuse Program  1-800-659-3381   °Psychological Services °Organization         Address  Phone  Notes  °[facility]  336- 832-9600   °[facility]  336- 378-7881   °[facility] [address], [place] 1-800-853-5163 or 336-641-4981   ° °Mobile Crisis  Teams °Organization         Address  Phone  Notes  °[facility], Mobile Crisis Care Unit  1-877-626-1772   °Assertive °[facility] ° [address] [place], [place] 336-834-9664   °Sharon DeEsch [address] °[place] 336-554-5454   ° °Self-Help/Support Groups °Organization         Address  Phone             Notes  °[facility] - variety of support groups  336- 373-1402 Call for more information  °Narcotics Anonymous (NA), [facility] [address], °[place]  2 meetings at this location  ° °Residential Treatment Programs °Organization         Address  Phone  Notes  °[facility] [address],    °[place]  1-866-801-8205   °[facility] ° [address], [place], [place] 704-293-8524   °[facility] [address], [place] 336-845-3988 Admissions: 8am-3pm M-F  °[facility] [address]    °[place], [place] 336-841-1104   °[facility] [address], [place], [place] 336-379-7146   °[facility] [address]  °[place], [place] 336-285-9073   °[facility] Intensive Outpatient [address], [place], [place] 336-852-3033   °[facility] [address]  °[place], [place] 1-877-615-2722 or 336-784-9470   °[facility] [address] [place], [place] 336-227-7417 Accepts Medicaid  °[facility] [address]  ° [place] 1-800-659-3381 Substance Abuse/Addiction Treatment  ° °[place] Behavioral Health Resources °Organization         Address  Phone  Notes  °[facility]  (888) 581-9988   °Julie Brannon, PhD [address] [place], [place]   (336) 349-5553 or (336) 951-0000   °[facility]   [address] °[place],  (336) 349-4454   °  [facility] [address], [place], [place] (336) 342-8316  Insurance/Medicaid/sponsorship through [facility]  °[facility] [address]                                    [place], [place] (336) 342-8316 Therapy/tele-psych/case  °[facility] [address]  ° [place], [place] (336) 349-2233    °Dr. Arfeen  (336) 349-4544   °[facility] [place]  [employer] [place] [facility] 1) [address], [place] °2) [address], [place] °3)  [address], [place] (336) 349-3220 °(336) 342-7768 ° °(336) 342-8140   °[place] [facility] (336) 342-1394 or (336) 342-3537 (After Hours)    ° ° ° ° °

## 2013-12-26 NOTE — Discharge Instructions (Signed)
Folliculitis  Folliculitis is redness, soreness, and swelling (inflammation) of the hair follicles. This condition can occur anywhere on the body. People with weakened immune systems, diabetes, or obesity have a greater risk of getting folliculitis. CAUSES  Bacterial infection. This is the most common cause.  Fungal infection.  Viral infection.  Contact with certain chemicals, especially oils and tars. Long-term folliculitis can result from bacteria that live in the nostrils. The bacteria may trigger multiple outbreaks of folliculitis over time. SYMPTOMS Folliculitis most commonly occurs on the scalp, thighs, legs, back, buttocks, and areas where hair is shaved frequently. An early sign of folliculitis is a small, white or yellow, pus-filled, itchy lesion (pustule). These lesions appear on a red, inflamed follicle. They are usually less than 0.2 inches (5 mm) wide. When there is an infection of the follicle that goes deeper, it becomes a boil or furuncle. A group of closely packed boils creates a larger lesion (carbuncle). Carbuncles tend to occur in hairy, sweaty areas of the body. DIAGNOSIS  Your caregiver can usually tell what is wrong by doing a physical exam. A sample may be taken from one of the lesions and tested in a lab. This can help determine what is causing your folliculitis. TREATMENT  Treatment may include:  Applying warm compresses to the affected areas.  Taking antibiotic medicines orally or applying them to the skin.  Draining the lesions if they contain a large amount of pus or fluid.  Laser hair removal for cases of long-lasting folliculitis. This helps to prevent regrowth of the hair. HOME CARE INSTRUCTIONS  Apply warm compresses to the affected areas as directed by your caregiver.  If antibiotics are prescribed, take them as directed. Finish them even if you start to feel better.  You may take over-the-counter medicines to relieve itching.  Do not shave  irritated skin.  Follow up with your caregiver as directed. SEEK IMMEDIATE MEDICAL CARE IF:   You have increasing redness, swelling, or pain in the affected area.  You have a fever. MAKE SURE YOU:  Understand these instructions.  Will watch your condition.  Will get help right away if you are not doing well or get worse. Document Released: 04/13/2001 Document Revised: 08/04/2011 Document Reviewed: 05/05/2011 [facility] Patient Information 2015 [place], [place]. This information is not intended to replace advice given to you by your health care provider. Make sure you discuss any questions you have with your health care provider.   Emergency Department Resource Guide 1) Find a Doctor and Pay Out of Pocket Although you won't have to find out who is covered by your insurance plan, it is a good idea to ask around and get recommendations. You will then need to call the office and see if the doctor you have chosen will accept you as a new patient and what types of options they offer for patients who are self-pay. Some doctors offer discounts or will set up payment plans for their patients who do not have insurance, but you will need to ask so you aren't surprised when you get to your appointment.  2) Contact Your Local Health Department Not all health departments have doctors that can see patients for sick visits, but many do, so it is worth a call to see if yours does. If you don't know where your local health department is, you can check in your phone book. The CDC also has a tool to help you locate your state's health department, and many state websites also have listings  of all of their local health departments.  3) Find a Walk-in Clinic If your illness is not likely to be very severe or complicated, you may want to try a walk in clinic. These are popping up all over the country in pharmacies, drugstores, and shopping centers. They're usually staffed by nurse practitioners or physician  assistants that have been trained to treat common illnesses and complaints. They're usually fairly quick and inexpensive. However, if you have serious medical issues or chronic medical problems, these are probably not your best option.  No Primary Care Doctor: - Call Health Connect at  [phone] - they can help you locate a primary care doctor that  accepts your insurance, provides certain services, etc. - Physician Referral Service- [phone]  Chronic Pain Problems: Organization         Address  Phone   Notes  [facility]  [phone] Patients need to be referred by their primary care doctor.   Medication Assistance: Organization         Address  Phone   Notes  [facility] Medication [facility] [address] [place], [place] [address] [phone]) [phone] --Must be a resident of [facility] -- Must have NO insurance coverage whatsoever (no Medicaid/ Medicare, etc.) -- The pt. MUST have a primary care doctor that directs their care regularly and follows them in the community   MedAssist  [phone]   [employer]  [phone]    Agencies that provide inexpensive medical care: Organization         Address  Phone   Notes  [facility]  [phone]   [facility]    [phone]   [facility] [address] [place], [place] [address] [phone]   [facility] [address], [place] [phone]) [phone]   Planned Parenthood    [phone]   [facility]    [phone](336) [phone]   [facility]  [address], [place] Phone:  [phone], Fax:  [phone] Hours of Operation:  9 am - 6 pm, M-F.  Also accepts Medicaid/Medicare and self-pay.  [facility]  [address], [place] Phone: [phone], Fax: [phone]. Hours of Operation:  8:30 am - 5:30 pm, M-F.  Also accepts  Medicaid and self-pay.  [facility] [place] [address], [place] Phone: [phone]) [phone]   [facility] [address], [place], [place] [phone], Ext. [phone] Mondays & Thursdays: 7-9 AM.  First 15 patients are seen on a first come, first serve basis.    Medicaid-accepting [facility] Providers:  Organization         Address  Phone   Notes  [facility] [address], [place] [phone] Also accepts self-pay patients.  [facility] [address], [place]  [phone]   [facility] [address], [place] [phone]) [phone]   [facility] [address] [place] [address], [place] [phone]) [phone]   Renaye RakersVeita Bland [address], [place]   [phone] Only accepts [place] Access [place]icaid patients after they have their name applied to their card.   Self-Pay (no insurance) in [facility] [place]:  Retail buyerrganization         Address  Phone   Notes  Sickle Cell Patients, [facility] [address], [place] [phone]) [phone]   [facility] [address], [place] [phone]   [facility] [place]  [address], [place] [phone]   [facility]/Dr. Osei-Bonsu  [address] [place] [address], [place] or [id number], [place] [phone] Phone number for both [place] and [place] locations is the same.  [facility] [address], [place] [phone]   [facility]them [address] [place] [address], [place] or [address] [phone](336) [phone]   [facility] [address], [place] [phone]) [phone], phone; [phone]) [phone], fax Sees patients 1st and 3rd Saturday of every month.  Must not qualify for public or private insurance (i.e. Medicaid, Medicare, [place] Health Choice, Veterans' Benefits)  Household  income should be no more than 200% of the poverty level The clinic cannot treat you if you are pregnant or think you are pregnant  Sexually transmitted diseases are not treated at the clinic.    Dental Care: Organization         Address  Phone  Notes  [facility] [address], [place] [phone] Accepts children up to age 22 who are enrolled in [place]icaid or [place] Health Choice; pregnant women with a Medicaid card; and children who have applied for Medicaid or [place] Health Choice, but were declined, whose parents can pay a reduced fee at time of service.  [facility] Department of [facility] [place]  [address], [place] [phone] Accepts children up to age 22 who are enrolled in [place]icaid or [place] Health Choice; pregnant women with a Medicaid card; and children who have applied for Medicaid or [place] Health Choice, but were declined, whose parents can pay a reduced fee at time of service.  [facility]  [address], [place] [phone]) [phone] Patients are seen by appointment only. Walk-ins are not accepted. [facility] will see patients 22 years of age and older. Monday - Tuesday (8am-5pm) Most Wednesdays (8:30-5pm) $30 per visit, cash only  [facility]  [address], [facility] [phone]) [phone] Patients are seen by appointment only. Walk-ins are not accepted. [facility] will see patients 22 years of age and older. One Wednesday Evening (Monthly: Volunteer Based).  $30 per visit, cash only  [facility]  [phone] for adults; Children under age 124, call Graduate Pediatric Dentistry at [phone]. Children aged 734-14, please call [phone] to request a pediatric application.  Dental services are provided in all areas of dental care including fillings, crowns and bridges, complete and partial dentures, implants, gum  treatment, root canals, and extractions. Preventive care is also provided. Treatment is provided to both adults and children. Patients are selected via a lottery and there is often a waiting list.   [facility] [address], [place]  [phone] [email or web address]   [facility] [address], [place], [place] [phone], Ext. [phone] Second and Fourth Thursday of each month, opens at 6:30 AM; Clinic ends at 9 AM.  Patients are seen on a first-come first-served basis, and a limited number are seen during each clinic.   [facility]  [address], [place], [place] [phone]) [phone]   Eligibility  Requirements You must have lived in [place], [place], or [place] counties for at least the last three months.   You cannot be eligible for state or federal sponsored National Cityhealthcare insurance, including CIGNAVeterans Administration, [place]icaid, or Harrah's EntertainmentMedicare.   You generally cannot be eligible for healthcare insurance through your employer.    How to apply: Eligibility screenings are held every Tuesday and Wednesday afternoon from 1:00 pm until 4:00 pm. You do not need an appointment for the interview!  [facility] [address], [place], [place] [phone]   [facility]  [phone]   [facility]  [phone]   [facility]  [phone]    Behavioral Health Resources in the Community: Intensive Outpatient Programs Organization         Address  Phone  Notes  [facility] [address], [place], [place] [phone]   [facility] [address], [place], [place] [phone]   [facility] [address], [place], [place]  [phone]   [facility] [address],  [place], [place] [phone] or [phone]   Substance Abuse Resources Organization         Address  Phone  Notes  Alcohol and  Drug Services  [phone]   [facility]  [phone]   [facility]  [phone]   [facility]  [phone]   Residential & Outpatient Substance Abuse Program  [phone]   Psychological Services Organization         Address  Phone  Notes  [facility]  [phone]- [phone]   [facility]  [phone]- [phone]   [facility] [address], [place] [phone] or [phone]    Mobile Crisis Teams Organization         Address  Phone  Notes  [facility], Mobile Crisis Care Unit  [phone]   [facility]  [address] [place], [place] [phone]   Doristine LocksSharon DeEsch [address] [place] [phone]    Self-Help/Support Groups Organization         Address  Phone             Notes  [facility] [place] - variety of support groups  [phone] Call for more information  Narcotics Anonymous (NA), [facility] [address], [place]  2 meetings at this location   Statisticianesidential Treatment Programs Organization         Address  Phone  Notes  [facility] [address],    [place]  [phone]   [facility]  [address], [place] [phone], [place], [place] [phone]   [facility] [address], [place] [phone] Admissions: 8am-3pm M-F  [facility] [address]    [place], [place] [phone]   [facility] [address], [place], [place] [phone]   [facility] [address]  [place], [place] [phone]   [facility] - Intensive Outpatient [address], [place], [place] [phone]   [facility] ([facility] [address]  [place], [place] [phone] or [phone]   [facility] ([facility]) [address] [place], [place] [phone] Accepts Medicaid  [facility]  [facility] [address]  [place] [phone] Substance Abuse/Addiction Treatment   [facility]al [facility] Resources Organization  Address  Phone  Notes  °[facility]  ([phone]) [phone]   °Julie Brannon, PhD [address] [place], [place]   ([phone]) [phone] or (336) [phone]   °[facility]   [address] °[place], [place] ([phone]) [phone]   °[facility] [address], [place], [place] ([phone]) [phone] Insurance/Medicaid/sponsorship through [facility]  °[facility] [address]                                    [place], [place] [phone]) [phone] Therapy/tele-psych/case  °[facility] [address]  ° [place], [place] [phone]    °Dr. Arfeen  ([phone]) [phone]   °[facility] [place]  [employer] [place] [facility] 1) [address], [place] °2) [address], [place] °3)  [address], [place] ([phone]) [phone] °(336) [phone] ° °(336) [phone]   °[place] Child Abuse Hotline (336) 342-1394 or (336) [phone] (After Hours)    ° ° ° °

## 2013-12-27 LAB — GC/CHLAMYDIA PROBE AMP
CT Probe RNA: NEGATIVE
GC PROBE AMP APTIMA: NEGATIVE

## 2014-05-03 ENCOUNTER — Encounter (HOSPITAL_COMMUNITY): Payer: Self-pay | Admitting: Emergency Medicine

## 2014-05-03 ENCOUNTER — Emergency Department (HOSPITAL_COMMUNITY)
Admission: EM | Admit: 2014-05-03 | Discharge: 2014-05-03 | Disposition: A | Discharge status: Home / Self Care | Payer: No Typology Code available for payment source | Attending: Emergency Medicine | Admitting: Emergency Medicine

## 2014-05-03 DIAGNOSIS — Z880 Allergy status to penicillin: Secondary | ICD-10-CM | POA: Insufficient documentation

## 2014-05-03 DIAGNOSIS — J45909 Unspecified asthma, uncomplicated: Secondary | ICD-10-CM | POA: Insufficient documentation

## 2014-05-03 DIAGNOSIS — R110 Nausea: Secondary | ICD-10-CM | POA: Insufficient documentation

## 2014-05-03 DIAGNOSIS — Z3202 Encounter for pregnancy test, result negative: Secondary | ICD-10-CM | POA: Insufficient documentation

## 2014-05-03 LAB — CBC WITH DIFFERENTIAL/PLATELET
Basophils Absolute: 0 10*3/uL (ref 0.0–0.1)
Basophils Relative: 0 % (ref 0–1)
EOS ABS: 0.1 10*3/uL (ref 0.0–0.7)
EOS PCT: 1 % (ref 0–5)
HCT: 39.2 % (ref 36.0–46.0)
HEMOGLOBIN: 12.9 g/dL (ref 12.0–15.0)
LYMPHS ABS: 3.6 10*3/uL (ref 0.7–4.0)
Lymphocytes Relative: 43 % (ref 12–46)
MCH: 27.7 pg (ref 26.0–34.0)
MCHC: 32.9 g/dL (ref 30.0–36.0)
MCV: 84.1 fL (ref 78.0–100.0)
MONO ABS: 0.7 10*3/uL (ref 0.1–1.0)
Monocytes Relative: 8 % (ref 3–12)
NEUTROS PCT: 48 % (ref 43–77)
Neutro Abs: 4.1 10*3/uL (ref 1.7–7.7)
PLATELETS: 258 10*3/uL (ref 150–400)
RBC: 4.66 MIL/uL (ref 3.87–5.11)
RDW: 13.2 % (ref 11.5–15.5)
WBC: 8.5 10*3/uL (ref 4.0–10.5)

## 2014-05-03 LAB — COMPREHENSIVE METABOLIC PANEL
ALT: 15 U/L (ref 0–35)
AST: 22 U/L (ref 0–37)
Albumin: 4.1 g/dL (ref 3.5–5.2)
Alkaline Phosphatase: 54 U/L (ref 39–117)
Anion gap: 9 (ref 5–15)
BILIRUBIN TOTAL: 0.5 mg/dL (ref 0.3–1.2)
BUN: 6 mg/dL (ref 6–23)
CALCIUM: 9.7 mg/dL (ref 8.4–10.5)
CO2: 24 mmol/L (ref 19–32)
CREATININE: 0.85 mg/dL (ref 0.50–1.10)
Chloride: 107 mmol/L (ref 96–112)
GFR calc non Af Amer: >90 mL/min (ref >90)
Glucose, Bld: 108 mg/dL — ABNORMAL HIGH (ref 70–99)
Potassium: 4.2 mmol/L (ref 3.5–5.1)
SODIUM: 140 mmol/L (ref 135–145)
Total Protein: 7 g/dL (ref 6.0–8.3)

## 2014-05-03 LAB — URINALYSIS, ROUTINE W REFLEX MICROSCOPIC
Bilirubin Urine: NEGATIVE
GLUCOSE, UA: NEGATIVE mg/dL
Hgb urine dipstick: NEGATIVE
Ketones, ur: NEGATIVE mg/dL
Nitrite: NEGATIVE
PROTEIN: NEGATIVE mg/dL
Specific Gravity, Urine: 1.024 (ref 1.005–1.030)
UROBILINOGEN UA: 0.2 mg/dL (ref 0.0–1.0)
pH: 6 (ref 5.0–8.0)

## 2014-05-03 LAB — POC URINE PREG, ED: Preg Test, Ur: NEGATIVE

## 2014-05-03 LAB — URINE MICROSCOPIC-ADD ON

## 2014-05-03 LAB — LIPASE, BLOOD: Lipase: 48 U/L (ref 11–59)

## 2014-05-03 MED ORDER — ONDANSETRON 8 MG PO TBDP: 8.0000 mg | ORAL_TABLET | Freq: Three times a day (TID) | ORAL | Status: DC | PRN

## 2014-05-03 MED ORDER — ONDANSETRON 4 MG PO TBDP
8.0000 mg | ORAL_TABLET | Freq: Once | ORAL | Status: AC
  Administered 2014-05-03: 8 mg via ORAL
  Filled 2014-05-03: qty 2

## 2014-05-03 NOTE — ED Notes (Signed)
C/o lower abd cramping and nausea since Wednesday morning.  Denies urinary complaint.

## 2014-05-03 NOTE — ED Provider Notes (Signed)
CSN: [id number]     Arrival date & time 05/03/14  0137 History   This chart was scribed for Janice BilisKevin Uriel Dowding, MD by Abel PrestoKara Demonbreun, ED Scribe. This patient was seen in room A06C/A06C and the patient's care was started at 3:35 AM.     Chief Complaint  Patient presents with  . Abdominal Pain     Patient is a 24 y.o. female presenting with abdominal pain. The history is provided by the patient. No language interpreter was used.  Abdominal Pain  HPI Comments: Janice White is a 24 y.o. female who presents to the Emergency Department complaining of generalized abdominal pain with onset yesterday. Pt notes associated nausea which has partially resolved. Pt was on depo until December 2015, notes she does not use birth control currently. Pt denies dysuria, vomiting, diarrhea, fever, and chills.  Past Medical History  Diagnosis Date  . Asthma    Past Surgical History  Procedure Laterality Date  . Wisdom tooth extraction     Family History  Problem Relation Age of Onset  . Diabetes Mother   . Hypertension Mother   . Heart failure Mother    History  Substance Use Topics  . Smoking status: Never Smoker   . Smokeless tobacco: Not on file  . Alcohol Use: No   OB History    No data available     Review of Systems  Gastrointestinal: Positive for abdominal pain.   A complete 10 system review of systems was obtained and all systems are negative except as noted in the HPI and PMH.     Allergies  Penicillins  Home Medications   Prior to Admission medications   Medication Sig Start Date End Date Taking? Authorizing Provider  aspirin-acetaminophen-caffeine (EXCEDRIN MIGRAINE) [phone] [id number] MG per tablet Take 1 tablet by mouth every 6 (six) hours as needed for headache.   Yes Historical Provider, MD  dicyclomine (BENTYL) 20 MG tablet Take 1 tablet (20 mg total) by mouth every 6 (six) hours as needed for spasms (for abdominal cramping). Patient not taking: Reported on 05/03/2014 12/26/13    Marisa Severinlga Otter, MD  famotidine (PEPCID) 20 MG tablet Take 1 tablet (20 mg total) by mouth 2 (two) times daily. Patient not taking: Reported on 05/03/2014 12/26/13   Marisa Severinlga Otter, MD  ondansetron (ZOFRAN ODT) 8 MG disintegrating tablet Take 1 tablet (8 mg total) by mouth every 8 (eight) hours as needed for nausea or vomiting. Patient not taking: Reported on 05/03/2014 12/26/13   Marisa Severinlga Otter, MD  sulfamethoxazole-trimethoprim [facility](SEPTRA DS) 800-160 MG per tablet Take 1 tablet by mouth every 12 (twelve) hours. Patient not taking: Reported on 05/03/2014 12/26/13   Courtney Forcucci, PA-C   BP 126/66 mmHg  Pulse 98  Temp(Src) 98.3 F (36.8 C) (Oral)  Resp 18  Ht 5' (1.524 m)  Wt 130 lb (58.968 kg)  BMI 25.39 kg/m2  SpO2 100% Physical Exam  Constitutional: She is oriented to person, place, and time. She appears well-developed and well-nourished. No distress.  HENT:  Head: Normocephalic and atraumatic.  Eyes: EOM are normal.  Neck: Normal range of motion.  Cardiovascular: Normal rate, regular rhythm and normal heart sounds.   Pulmonary/Chest: Effort normal and breath sounds normal.  Abdominal: Soft. She exhibits no distension. There is no tenderness.  Musculoskeletal: Normal range of motion.  Neurological: She is alert and oriented to person, place, and time.  Skin: Skin is warm and dry.  Psychiatric: She has a normal mood and affect. Judgment normal.  Nursing  note and vitals reviewed.   ED Course  Procedures (including critical care time) DIAGNOSTIC STUDIES: Oxygen Saturation is 100% on room air, normal by my interpretation.    COORDINATION OF CARE: 3:37 AM Discussed treatment plan with patient at beside, the patient agrees with the plan and has no further questions at this time.   Labs Review Labs Reviewed  COMPREHENSIVE METABOLIC PANEL - Abnormal; Notable for the following:    Glucose, Bld 108 (*)    All other components within normal limits  URINALYSIS, ROUTINE W REFLEX MICROSCOPIC -  Abnormal; Notable for the following:    APPearance CLOUDY (*)    Leukocytes, UA SMALL (*)    All other components within normal limits  URINE MICROSCOPIC-ADD ON - Abnormal; Notable for the following:    Squamous Epithelial / LPF FEW (*)    All other components within normal limits  CBC WITH DIFFERENTIAL/PLATELET  LIPASE, BLOOD  POC URINE PREG, ED    Imaging Review No results found.   EKG Interpretation None      MDM   Final diagnoses:  Nausea   Patient is overall well-appearing.  Her abdomen is benign.  Her nausea is nearly improved at this time.  Discharge home in good condition.  Likely viral process.  Labs and urine are without significant abnormality.   I personally performed the services described in this documentation, which was scribed in my presence. The recorded information has been reviewed and is accurate.       Janice Bilis, MD 05/03/14 [phone]

## 2014-05-31 ENCOUNTER — Encounter (HOSPITAL_COMMUNITY): Payer: Self-pay | Admitting: *Deleted

## 2014-05-31 ENCOUNTER — Emergency Department (HOSPITAL_COMMUNITY)
Admission: EM | Admit: 2014-05-31 | Discharge: 2014-06-01 | Disposition: A | Discharge status: Home / Self Care | Payer: No Typology Code available for payment source | Attending: Emergency Medicine | Admitting: Emergency Medicine

## 2014-05-31 DIAGNOSIS — J45909 Unspecified asthma, uncomplicated: Secondary | ICD-10-CM | POA: Insufficient documentation

## 2014-05-31 DIAGNOSIS — N939 Abnormal uterine and vaginal bleeding, unspecified: Secondary | ICD-10-CM | POA: Insufficient documentation

## 2014-05-31 DIAGNOSIS — Z3202 Encounter for pregnancy test, result negative: Secondary | ICD-10-CM | POA: Insufficient documentation

## 2014-05-31 DIAGNOSIS — R109 Unspecified abdominal pain: Secondary | ICD-10-CM | POA: Insufficient documentation

## 2014-05-31 DIAGNOSIS — Z880 Allergy status to penicillin: Secondary | ICD-10-CM | POA: Diagnosis not present

## 2014-05-31 LAB — CBC WITH DIFFERENTIAL/PLATELET
BASOS ABS: 0 10*3/uL (ref 0.0–0.1)
BASOS PCT: 0 % (ref 0–1)
EOS ABS: 0.1 10*3/uL (ref 0.0–0.7)
Eosinophils Relative: 1 % (ref 0–5)
HEMATOCRIT: 37.5 % (ref 36.0–46.0)
Hemoglobin: 12.4 g/dL (ref 12.0–15.0)
Lymphocytes Relative: 37 % (ref 12–46)
Lymphs Abs: 2.9 10*3/uL (ref 0.7–4.0)
MCH: 27.4 pg (ref 26.0–34.0)
MCHC: 33.1 g/dL (ref 30.0–36.0)
MCV: 83 fL (ref 78.0–100.0)
Monocytes Absolute: 0.7 10*3/uL (ref 0.1–1.0)
Monocytes Relative: 9 % (ref 3–12)
NEUTROS ABS: 4.1 10*3/uL (ref 1.7–7.7)
Neutrophils Relative %: 53 % (ref 43–77)
PLATELETS: 268 10*3/uL (ref 150–400)
RBC: 4.52 MIL/uL (ref 3.87–5.11)
RDW: 13.2 % (ref 11.5–15.5)
WBC: 7.8 10*3/uL (ref 4.0–10.5)

## 2014-05-31 LAB — WET PREP, GENITAL
TRICH WET PREP: NONE SEEN
YEAST WET PREP: NONE SEEN

## 2014-05-31 LAB — POC URINE PREG, ED: PREG TEST UR: NEGATIVE

## 2014-05-31 MED ORDER — IBUPROFEN 800 MG PO TABS
800.0000 mg | ORAL_TABLET | Freq: Once | ORAL | Status: AC
  Administered 2014-05-31: 800 mg via ORAL
  Filled 2014-05-31: qty 1

## 2014-05-31 NOTE — ED Provider Notes (Signed)
CSN: [id number]     Arrival date & time 05/31/14  2058 History   First MD Initiated Contact with Patient 05/31/14 2158     Chief Complaint  Patient presents with  . Vaginal Bleeding  . Abdominal Cramping     (Consider location/radiation/quality/duration/timing/severity/associated sxs/prior Treatment) Patient is a 24 y.o. female presenting with vaginal bleeding and cramps. The history is provided by the patient.  Vaginal Bleeding Quality:  Spotting Severity:  Mild Onset quality:  Gradual Duration:  2 days Timing:  Intermittent Progression:  Unchanged Chronicity:  New Context: spontaneously   Context comment:  D/c depo shot several months ago Relieved by:  Nothing Worsened by:  Nothing tried Ineffective treatments:  None tried Associated symptoms: abdominal pain   Associated symptoms: no back pain, no dizziness, no dysuria, no fatigue, no fever and no nausea   Abdominal Cramping Associated symptoms include abdominal pain. Pertinent negatives include no chest pain, no headaches and no shortness of breath.    Past Medical History  Diagnosis Date  . Asthma    Past Surgical History  Procedure Laterality Date  . Wisdom tooth extraction     Family History  Problem Relation Age of Onset  . Diabetes Mother   . Hypertension Mother   . Heart failure Mother    History  Substance Use Topics  . Smoking status: Never Smoker   . Smokeless tobacco: Not on file  . Alcohol Use: No   OB History    No data available     Review of Systems  Constitutional: Negative for fever and fatigue.  HENT: Negative for congestion and drooling.   Eyes: Negative for pain.  Respiratory: Negative for cough and shortness of breath.   Cardiovascular: Negative for chest pain.  Gastrointestinal: Positive for abdominal pain. Negative for nausea, vomiting and diarrhea.  Genitourinary: Positive for vaginal bleeding. Negative for dysuria and hematuria.  Musculoskeletal: Negative for back pain, gait  problem and neck pain.  Skin: Negative for color change.  Neurological: Negative for dizziness and headaches.  Hematological: Negative for adenopathy.  Psychiatric/Behavioral: Negative for behavioral problems.  All other systems reviewed and are negative.     Allergies  Penicillins  Home Medications   Prior to Admission medications   Medication Sig Start Date End Date Taking? Authorizing Provider  aspirin-acetaminophen-caffeine (EXCEDRIN MIGRAINE) [phone] MG per tablet Take 1 tablet by mouth every 6 (six) hours as needed for headache.   Yes Historical Provider, MD  dicyclomine (BENTYL) 20 MG tablet Take 1 tablet (20 mg total) by mouth every 6 (six) hours as needed for spasms (for abdominal cramping). Patient not taking: Reported on 05/03/2014 12/26/13   Marisa Severin, MD  famotidine (PEPCID) 20 MG tablet Take 1 tablet (20 mg total) by mouth 2 (two) times daily. Patient not taking: Reported on 05/03/2014 12/26/13   Marisa Severin, MD  ondansetron (ZOFRAN ODT) 8 MG disintegrating tablet Take 1 tablet (8 mg total) by mouth every 8 (eight) hours as needed for nausea or vomiting. Patient not taking: Reported on 05/31/2014 05/03/14   Azalia Bilis, MD   BP 111/64 mmHg  Pulse 76  Temp(Src) 98.2 F (36.8 C) (Oral)  Resp 20  Ht  (1.6 m)  Wt 136 lb (61.689 kg)  BMI 24.10 kg/m2  SpO2 100% Physical Exam  Constitutional: She is oriented to person, place, and time. She appears well-developed and well-nourished.  HENT:  Head: Normocephalic and atraumatic.  Mouth/Throat: Oropharynx is clear and moist. No oropharyngeal exudate.  Eyes: Conjunctivae  and EOM are normal. Pupils are equal, round, and reactive to light.  Neck: Normal range of motion. Neck supple.  Cardiovascular: Normal rate, regular rhythm, normal heart sounds and intact distal pulses.  Exam reveals no gallop and no friction rub.   No murmur heard. Pulmonary/Chest: Effort normal and breath sounds normal. No respiratory distress. She  has no wheezes.  Abdominal: Soft. Bowel sounds are normal. There is no tenderness. There is no rebound and no guarding.  Genitourinary:  Normal-appearing external vagina. Normal-appearing cervix. Os closed. Very small amount of dark blood noted in the posterior fornix. No significant cervical motion tenderness or adnexal tenderness.  Musculoskeletal: Normal range of motion. She exhibits no edema or tenderness.  Neurological: She is alert and oriented to person, place, and time.  Skin: Skin is warm and dry.  Psychiatric: She has a normal mood and affect. Her behavior is normal.  Nursing note and vitals reviewed.   ED Course  Procedures (including critical care time) Labs Review Labs Reviewed  WET PREP, GENITAL - Abnormal; Notable for the following:    Clue Cells Wet Prep HPF POC FEW (*)    WBC, Wet Prep HPF POC FEW (*)    All other components within normal limits  CBC WITH DIFFERENTIAL/PLATELET  COMPREHENSIVE METABOLIC PANEL  POC URINE PREG, ED  GC/CHLAMYDIA PROBE AMP ([place])    Imaging Review No results found.   EKG Interpretation None      MDM   Final diagnoses:  Vaginal bleeding  Abdominal cramping    10:26 PM 24 y.o. female who presents with vaginal spotting and intermittent lower abdominal cramping over the last 2-3 days. She states that she discontinued the depo shot in November 2015. She's been sexually active since then and using protection. She denies any history of STDs. She has not had a period since she got off of the depo shot. She is afebrile and vital signs are unremarkable here. She is not currently having any abdominal cramping. No dysuria.  Will get screening labs and perform pelvic.  12:10 AM: I interpreted/reviewed the labs and/or imaging which were non-contributory.  I suspect the patient's symptoms are related to her recently discontinuing the Depoe shot and a suspect she'll likely have irregular periods for some time. I have discussed the  diagnosis/risks/treatment options with the patient and believe the pt to be eligible for discharge home to follow-up with her pcp as needed. We also discussed returning to the ED immediately if new or worsening sx occur. We discussed the sx which are most concerning (e.g., worsening bleeding, abd pain, fever, vomiting) that necessitate immediate return. Medications administered to the patient during their visit and any new prescriptions provided to the patient are listed below.  Medications given during this visit Medications  ibuprofen (ADVIL,MOTRIN) tablet 800 mg (800 mg Oral Given 05/31/14 2343)    New Prescriptions   No medications on file       Purvis SheffieldForrest Akela Pocius, MD 06/01/14 0011

## 2014-05-31 NOTE — ED Notes (Signed)
Pt c/o vaginal bleeding and abdominal cramping for two day. Pt does not know if it is her period because she just got off the depo shot.

## 2014-06-01 ENCOUNTER — Ambulatory Visit (INDEPENDENT_AMBULATORY_CARE_PROVIDER_SITE_OTHER): Payer: Self-pay | Admitting: Emergency Medicine

## 2014-06-01 VITALS — BP 104/64 | HR 83 | Temp 98.1°F | Resp 16 | Ht 60.0 in | Wt 134.0 lb

## 2014-06-01 DIAGNOSIS — R300 Dysuria: Secondary | ICD-10-CM

## 2014-06-01 DIAGNOSIS — N3000 Acute cystitis without hematuria: Secondary | ICD-10-CM

## 2014-06-01 LAB — COMPREHENSIVE METABOLIC PANEL
ALBUMIN: 4 g/dL (ref 3.5–5.2)
ALK PHOS: 58 U/L (ref 39–117)
ALT: 13 U/L (ref 0–35)
ANION GAP: 11 (ref 5–15)
AST: 16 U/L (ref 0–37)
BUN: 6 mg/dL (ref 6–23)
CO2: 24 mmol/L (ref 19–32)
Calcium: 9.2 mg/dL (ref 8.4–10.5)
Chloride: 104 mmol/L (ref 96–112)
Creatinine, Ser: 0.75 mg/dL (ref 0.50–1.10)
GFR calc non Af Amer: >90 mL/min (ref >90)
GLUCOSE: 95 mg/dL (ref 70–99)
Potassium: 3.6 mmol/L (ref 3.5–5.1)
Sodium: 139 mmol/L (ref 135–145)
Total Bilirubin: 0.6 mg/dL (ref 0.3–1.2)
Total Protein: 6.4 g/dL (ref 6.0–8.3)

## 2014-06-01 LAB — POCT URINALYSIS DIPSTICK
BILIRUBIN UA: NEGATIVE
Glucose, UA: NEGATIVE
Ketones, UA: 40
PH UA: 5.5
Protein, UA: 100
SPEC GRAV UA: 1.025
Urobilinogen, UA: 1

## 2014-06-01 LAB — POCT UA - MICROSCOPIC ONLY
CASTS, UR, LPF, POC: NEGATIVE
CRYSTALS, UR, HPF, POC: NEGATIVE
Mucus, UA: NEGATIVE
Yeast, UA: NEGATIVE

## 2014-06-01 LAB — GC/CHLAMYDIA PROBE AMP (~~LOC~~) NOT AT ARMC
CHLAMYDIA, DNA PROBE: NEGATIVE
Neisseria Gonorrhea: NEGATIVE

## 2014-06-01 LAB — POCT URINE PREGNANCY: Preg Test, Ur: NEGATIVE

## 2014-06-01 MED ORDER — PHENAZOPYRIDINE HCL 200 MG PO TABS: 200.0000 mg | ORAL_TABLET | Freq: Three times a day (TID) | ORAL | Status: DC | PRN

## 2014-06-01 MED ORDER — SULFAMETHOXAZOLE-TRIMETHOPRIM 800-160 MG PO TABS: 1.0000 | ORAL_TABLET | Freq: Two times a day (BID) | ORAL | Status: DC

## 2014-06-01 NOTE — Patient Instructions (Signed)
Sulfamethoxazole; Trimethoprim, SMX-TMP tablets  What is this medicine?  SULFAMETHOXAZOLE; TRIMETHOPRIM or SMX-TMP (suhl fuh meth OK suh zohl; trye METH oh prim) is a combination of a sulfonamide antibiotic and a second antibiotic, trimethoprim. It is used to treat or prevent certain kinds of bacterial infections. It will not work for colds, flu, or other viral infections.  This medicine may be used for other purposes; ask your health care provider or pharmacist if you have questions.  COMMON BRAND NAME(S): Bacter-Aid DS, Bactrim, Bactrim DS, Septra, Septra DS  What should I tell my health care provider before I take this medicine?  They need to know if you have any of these conditions:  -anemia  -asthma  -being treated with anticonvulsants  -if you frequently drink alcohol containing drinks  -kidney disease  -liver disease  -low level of folic acid or glucose-6-phosphate dehydrogenase  -poor nutrition or malabsorption  -porphyria  -severe allergies  -thyroid disorder  -an unusual or allergic reaction to sulfamethoxazole, trimethoprim, sulfa drugs, other medicines, foods, dyes, or preservatives  -pregnant or trying to get pregnant  -breast-feeding  How should I use this medicine?  Take this medicine by mouth with a full glass of water. Follow the directions on the prescription label. Take your medicine at regular intervals. Do not take it more often than directed. Do not skip doses or stop your medicine early.  Talk to your pediatrician regarding the use of this medicine in children. Special care may be needed. This medicine has been used in children as young as 2 months of age.  Overdosage: If you think you have taken too much of this medicine contact a poison control center or emergency room at once.  NOTE: This medicine is only for you. Do not share this medicine with others.  What if I miss a dose?  If you miss a dose, take it as soon as you can. If it is almost time for your next dose, take only that dose. Do  not take double or extra doses.  What may interact with this medicine?  Do not take this medicine with any of the following medications:  -aminobenzoate potassium  -dofetilide  -metronidazole  This medicine may also interact with the following medications:  -ACE inhibitors like benazepril, enalapril, lisinopril, and ramipril  -birth control pills  -cyclosporine  -digoxin  -diuretics  -indomethacin  -medicines for diabetes  -methenamine  -methotrexate  -phenytoin  -potassium supplements  -pyrimethamine  -sulfinpyrazone  -tricyclic antidepressants  -warfarin  This list may not describe all possible interactions. Give your health care provider a list of all the medicines, herbs, non-prescription drugs, or dietary supplements you use. Also tell them if you smoke, drink alcohol, or use illegal drugs. Some items may interact with your medicine.  What should I watch for while using this medicine?  Tell your doctor or health care professional if your symptoms do not improve. Drink several glasses of water a day to reduce the risk of kidney problems.  Do not treat diarrhea with over the counter products. Contact your doctor if you have diarrhea that lasts more than 2 days or if it is severe and watery.  This medicine can make you more sensitive to the sun. Keep out of the sun. If you cannot avoid being in the sun, wear protective clothing and use a sunscreen. Do not use sun lamps or tanning beds/booths.  What side effects may I notice from receiving this medicine?  Side effects that you should report to   your doctor or health care professional as soon as possible:  -allergic reactions like skin rash or hives, swelling of the face, lips, or tongue  -breathing problems  -fever or chills, sore throat  -irregular heartbeat, chest pain  -joint or muscle pain  -pain or difficulty passing urine  -red pinpoint spots on skin  -redness, blistering, peeling or loosening of the skin, including inside the mouth  -unusual bleeding or  bruising  -unusually weak or tired  -yellowing of the eyes or skin  Side effects that usually do not require medical attention (report to your doctor or health care professional if they continue or are bothersome):  -diarrhea  -dizziness  -headache  -loss of appetite  -nausea, vomiting  -nervousness  This list may not describe all possible side effects. Call your doctor for medical advice about side effects. You may report side effects to FDA at 1-800-FDA-1088.  Where should I keep my medicine?  Keep out of the reach of children.  Store at room temperature between 20 to 25 degrees C (68 to 77 degrees F). Protect from light. Throw away any unused medicine after the expiration date.  NOTE: This sheet is a summary. It may not cover all possible information. If you have questions about this medicine, talk to your doctor, pharmacist, or health care provider.   2015, Elsevier/Gold Standard. (2012-09-09 14:38:26)  Urinary Tract Infection  Urinary tract infections (UTIs) can develop anywhere along your urinary tract. Your urinary tract is your body's drainage system for removing wastes and extra water. Your urinary tract includes two kidneys, two ureters, a bladder, and a urethra. Your kidneys are a pair of bean-shaped organs. Each kidney is about the size of your fist. They are located below your ribs, one on each side of your spine.  CAUSES  Infections are caused by microbes, which are microscopic organisms, including fungi, viruses, and bacteria. These organisms are so small that they can only be seen through a microscope. Bacteria are the microbes that most commonly cause UTIs.  SYMPTOMS   Symptoms of UTIs may vary by age and gender of the patient and by the location of the infection. Symptoms in young women typically include a frequent and intense urge to urinate and a painful, burning feeling in the bladder or urethra during urination. Older women and men are more likely to be tired, shaky, and weak and have muscle  aches and abdominal pain. A fever may mean the infection is in your kidneys. Other symptoms of a kidney infection include pain in your back or sides below the ribs, nausea, and vomiting.  DIAGNOSIS  To diagnose a UTI, your caregiver will ask you about your symptoms. Your caregiver also will ask to provide a urine sample. The urine sample will be tested for bacteria and white blood cells. White blood cells are made by your body to help fight infection.  TREATMENT   Typically, UTIs can be treated with medication. Because most UTIs are caused by a bacterial infection, they usually can be treated with the use of antibiotics. The choice of antibiotic and length of treatment depend on your symptoms and the type of bacteria causing your infection.  HOME CARE INSTRUCTIONS   If you were prescribed antibiotics, take them exactly as your caregiver instructs you. Finish the medication even if you feel better after you have only taken some of the medication.   Drink enough water and fluids to keep your urine clear or pale yellow.   Avoid caffeine, tea,   and carbonated beverages. They tend to irritate your bladder.   Empty your bladder often. Avoid holding urine for long periods of time.   Empty your bladder before and after sexual intercourse.   After a bowel movement, women should cleanse from front to back. Use each tissue only once.  SEEK MEDICAL CARE IF:    You have back pain.   You develop a fever.   Your symptoms do not begin to resolve within 3 days.  SEEK IMMEDIATE MEDICAL CARE IF:    You have severe back pain or lower abdominal pain.   You develop chills.   You have nausea or vomiting.   You have continued burning or discomfort with urination.  MAKE SURE YOU:    Understand these instructions.   Will watch your condition.   Will get help right away if you are not doing well or get worse.  Document Released: 11/12/2004 Document Revised: 08/04/2011 Document Reviewed: 03/13/2011  ExitCare Patient Information  2015 [employer]. This information is not intended to replace advice given to you by your health care provider. Make sure you discuss any questions you have with your health care provider.

## 2014-06-01 NOTE — Progress Notes (Signed)
[facility] [address] [place] [address] [id number]  Date:  06/01/2014   Name:  Janice White   DOB:  25-Feb-1990   MRN:  [id number]  PCP:  Pcp Not In System    Chief Complaint: Abdominal Cramping and Vaginal Bleeding   History of Present Illness:  Janice White is a 24 y.o. very pleasant female patient who presents with the following:  Says was on depo until 12/15.  Stopped the med and is using no contraception. She has dysuria, urgency and frequency for past four days Some abdominal pain and cramps Used one pad today with menses. No fever or chills.  No nausea or vomiting No stool change No vaginal discharge. Seen last night and now here because she was not checked for UTI in ER> No improvement with over the counter medications or other home remedies.  Denies other complaint or health concern today.   There are no active problems to display for this patient.   Past Medical History  Diagnosis Date  . Asthma     Past Surgical History  Procedure Laterality Date  . Wisdom tooth extraction      History  Substance Use Topics  . Smoking status: Never Smoker   . Smokeless tobacco: Not on file  . Alcohol Use: No    Family History  Problem Relation Age of Onset  . Diabetes Mother   . Hypertension Mother   . Heart failure Mother   . Heart disease Father   . Stroke Father     Allergies  Allergen Reactions  . Penicillins Itching    Medication list has been reviewed and updated.  No current outpatient prescriptions on file prior to visit.   No current facility-administered medications on file prior to visit.    Review of Systems:  As per HPI, otherwise negative.    Physical Examination: Filed Vitals:   06/01/14 1619  BP: 104/64  Pulse: 83  Temp: 98.1 F (36.7 C)  Resp: 16   Filed Vitals:   06/01/14 1619  Height: 5' (1.524 m)  Weight: 134 lb (60.782 kg)   Body mass index is 26.17 kg/(m^2). Ideal Body Weight:  Weight in (lb) to have BMI = 25: 127.7  GEN: WDWN, NAD, Non-toxic, A & O x 3 HEENT: Atraumatic, Normocephalic. Neck supple. No masses, No LAD. Ears and Nose: No external deformity. CV: RRR, No M/G/R. No JVD. No thrill. No extra heart sounds. PULM: CTA B, no wheezes, crackles, rhonchi. No retractions. No resp. distress. No accessory muscle use. ABD: S, NT, ND, +BS. No rebound. No HSM. EXTR: No c/c/e NEURO Normal gait.  PSYCH: Normally interactive. Conversant. Not depressed or anxious appearing.  Calm demeanor.    Assessment and Plan: Acute cystitis Septra Pyridium Reminded no pregnancy while on antibiotic.  Understands risks  Signed,  Phillips Odor, MD   Results for orders placed or performed in visit on 06/01/14  POCT UA - Microscopic Only  Result Value Ref Range   WBC, Ur, HPF, POC tntc    RBC, urine, microscopic tntc    Bacteria, U Microscopic 1+    Mucus, UA neg    Epithelial cells, urine per micros 0-3    Crystals, Ur, HPF, POC neg    Casts, Ur, LPF, POC neg    Yeast, UA neg   POCT urinalysis dipstick  Result Value Ref Range   Color, UA yellow    Clarity, UA cloudy    Glucose, UA  neg    Bilirubin, UA neg    Ketones, UA 40    Spec Grav, UA 1.025    Blood, UA large    pH, UA 5.5    Protein, UA 100    Urobilinogen, UA 1.0    Nitrite, UA postive    Leukocytes, UA large (3+)   POCT urine pregnancy  Result Value Ref Range   Preg Test, Ur Negative

## 2014-06-01 NOTE — Addendum Note (Signed)
Addended by: Carmelina DaneANDERSON, Nysir Fergusson S on: 06/01/2014 05:25 PM   Modules accepted: Level of Service

## 2014-06-20 ENCOUNTER — Ambulatory Visit: Payer: No Typology Code available for payment source | Attending: Internal Medicine

## 2014-07-24 ENCOUNTER — Emergency Department (HOSPITAL_COMMUNITY)
Admission: EM | Admit: 2014-07-24 | Discharge: 2014-07-24 | Disposition: A | Discharge status: Home / Self Care | Payer: No Typology Code available for payment source | Attending: Emergency Medicine | Admitting: Emergency Medicine

## 2014-07-24 ENCOUNTER — Encounter (HOSPITAL_COMMUNITY): Payer: Self-pay

## 2014-07-24 DIAGNOSIS — Z3202 Encounter for pregnancy test, result negative: Secondary | ICD-10-CM | POA: Insufficient documentation

## 2014-07-24 DIAGNOSIS — N760 Acute vaginitis: Secondary | ICD-10-CM | POA: Insufficient documentation

## 2014-07-24 DIAGNOSIS — J45909 Unspecified asthma, uncomplicated: Secondary | ICD-10-CM | POA: Insufficient documentation

## 2014-07-24 DIAGNOSIS — Z79899 Other long term (current) drug therapy: Secondary | ICD-10-CM | POA: Insufficient documentation

## 2014-07-24 DIAGNOSIS — Z880 Allergy status to penicillin: Secondary | ICD-10-CM | POA: Insufficient documentation

## 2014-07-24 DIAGNOSIS — N390 Urinary tract infection, site not specified: Secondary | ICD-10-CM | POA: Insufficient documentation

## 2014-07-24 DIAGNOSIS — B9689 Other specified bacterial agents as the cause of diseases classified elsewhere: Secondary | ICD-10-CM

## 2014-07-24 LAB — POC URINE PREG, ED: Preg Test, Ur: NEGATIVE

## 2014-07-24 LAB — URINE MICROSCOPIC-ADD ON

## 2014-07-24 LAB — URINALYSIS, ROUTINE W REFLEX MICROSCOPIC
Bilirubin Urine: NEGATIVE
Glucose, UA: NEGATIVE mg/dL
Ketones, ur: NEGATIVE mg/dL
NITRITE: NEGATIVE
Protein, ur: 100 mg/dL — AB
Specific Gravity, Urine: 1.018 (ref 1.005–1.030)
Urobilinogen, UA: 1 mg/dL (ref 0.0–1.0)
pH: 7 (ref 5.0–8.0)

## 2014-07-24 LAB — WET PREP, GENITAL
TRICH WET PREP: NONE SEEN
YEAST WET PREP: NONE SEEN

## 2014-07-24 MED ORDER — SULFAMETHOXAZOLE-TRIMETHOPRIM 800-160 MG PO TABS: 1.0000 | ORAL_TABLET | Freq: Two times a day (BID) | ORAL | Status: AC

## 2014-07-24 MED ORDER — METRONIDAZOLE 500 MG PO TABS: 500.0000 mg | ORAL_TABLET | Freq: Two times a day (BID) | ORAL | Status: DC

## 2014-07-24 MED ORDER — PHENAZOPYRIDINE HCL 200 MG PO TABS: 200.0000 mg | ORAL_TABLET | Freq: Three times a day (TID) | ORAL | Status: DC

## 2014-07-24 NOTE — ED Notes (Signed)
Pt A&OX4, ambulatory at d/c with steady gait, NAD 

## 2014-07-24 NOTE — ED Notes (Signed)
Pt reports she has urinary frequency and a "tingly" feeling when she urinates. She also reports lower abdominal cramps.

## 2014-07-24 NOTE — ED Provider Notes (Signed)
CSN: [id number]     Arrival date & time 07/24/14  1511 History  This chart was scribed for non-physician practitioner, [facility] M. Damian Leavell, NP working with Elwin Mocha, MD by Doreatha Martin, ED scribe. This patient was seen in room TR02C/TR02C and the patient's care was started at 4:05 PM    Chief Complaint  Patient presents with  . Urinary Frequency   Patient is a 24 y.o. female presenting with frequency. The history is provided by the patient. No language interpreter was used.  Urinary Frequency This is a new problem. The current episode started yesterday. The problem occurs constantly. The problem has not changed since onset.Pertinent negatives include no abdominal pain. Nothing aggravates the symptoms. Nothing relieves the symptoms. She has tried nothing for the symptoms.    HPI Comments: Janice White is a 24 y.o. female who presents to the Emergency Department complaining of urinary frequency onset yesterday. Pt reports associated pressure, cramping, and decreased amounts of urine upon urination. LNMP was 12 days ago. Pt states she has been on depo for a few years and is not currently on any birth control. She states that she has had a pap-smear, but does not remember when. She is currently sexually active (last contact 2 days ago) and has been with her current partner for over a year. Pt has a history of UTIs and states that current symptoms feel similar. She states that she took a pregnancy test that was negative. She denies Hx of STD and pregnancy. She also denies fever, chills incontinence, urgency, and burning.  Past Medical History  Diagnosis Date  . Asthma    Past Surgical History  Procedure Laterality Date  . Wisdom tooth extraction     Family History  Problem Relation Age of Onset  . Diabetes Mother   . Hypertension Mother   . Heart failure Mother   . Heart disease Father   . Stroke Father    History  Substance Use Topics  . Smoking status: Never Smoker   . Smokeless tobacco:  Not on file  . Alcohol Use: No   OB History    No data available     Review of Systems  Constitutional: Negative for fever and chills.  Gastrointestinal: Negative for abdominal pain.  Genitourinary: Positive for frequency and decreased urine volume. Negative for dysuria and urgency.  All other systems reviewed and are negative.  Allergies  Penicillins  Home Medications   Prior to Admission medications   Medication Sig Start Date End Date Taking? Authorizing Provider  metroNIDAZOLE (FLAGYL) 500 MG tablet Take 1 tablet (500 mg total) by mouth 2 (two) times daily. 07/24/14   Dietrich Samuelson Orlene Och, NP  phenazopyridine (PYRIDIUM) 200 MG tablet Take 1 tablet (200 mg total) by mouth 3 (three) times daily. 07/24/14   Dallis Czaja Orlene Och, NP  Prenatal Multivit-Min-Fe-FA (PRENATAL VITAMINS) 0.8 MG tablet Take 1 tablet by mouth daily.    Historical Provider, MD  sulfamethoxazole-trimethoprim (BACTRIM DS,SEPTRA DS) 800-160 MG per tablet Take 1 tablet by mouth 2 (two) times daily. 07/24/14 07/31/14  Rhiley Solem Orlene Och, NP   BP 114/75 mmHg  Pulse 103  Temp(Src) 98.3 F (36.8 C) (Oral)  Resp 15  Ht  (1.499 m)  Wt 134 lb (60.782 kg)  BMI 27.05 kg/m2  SpO2 100%  LMP 07/11/2014 Physical Exam  Constitutional: She is oriented to person, place, and time. She appears well-developed and well-nourished. No distress.  HENT:  Head: Normocephalic and atraumatic.  Mouth/Throat: Oropharynx is clear  and moist.  Eyes: Conjunctivae and EOM are normal. Pupils are equal, round, and reactive to light.  Neck: Normal range of motion. Neck supple. No tracheal deviation present.  Cardiovascular: Normal rate, regular rhythm and normal heart sounds.  Exam reveals no gallop and no friction rub.   No murmur heard. Pulmonary/Chest: Effort normal and breath sounds normal. No respiratory distress.  Lungs CTA  Abdominal: Soft. Bowel sounds are normal. She exhibits no distension. There is tenderness. There is no rebound and no guarding.   TTP to suprapubic region.   Musculoskeletal: Normal range of motion. She exhibits no edema or tenderness.  No CVA tenderness.   Neurological: She is alert and oriented to person, place, and time.  Skin: Skin is warm and dry.  Psychiatric: She has a normal mood and affect. Her behavior is normal.  Nursing note and vitals reviewed.   ED Course  Procedures (including critical care time) DIAGNOSTIC STUDIES: Oxygen Saturation is 100% on RA, normal by my interpretation.    COORDINATION OF CARE: 4:41 PM Discussed treatment plan with pt at bedside and pt agreed to plan.   Labs Review No results found for this or any previous visit (from the past 24 hour(s)).  MDM  24 y.o. female with UTI symptoms and abdominal cramping. Stable for d/c without acute abdomen or signs of pyelo. Will treat UTI and BV. Discussed with the patient clinical and lab findings and all questioned fully answered. She will return if any problems arise.  Final diagnoses:  UTI (lower urinary tract infection)  Bacterial vaginosis   I personally performed the services described in this documentation, which was scribed in my presence. The recorded information has been reviewed and is accurate.    [address]Cella Cappello [place] Weslie Rasmus, NP 07/26/14 1629  Elwin MochaBlair Walden, MD 08/06/14 [phone]

## 2014-07-25 ENCOUNTER — Ambulatory Visit: Payer: No Typology Code available for payment source | Attending: Internal Medicine

## 2014-07-25 LAB — GC/CHLAMYDIA PROBE AMP (~~LOC~~) NOT AT ARMC
Chlamydia: NEGATIVE
Neisseria Gonorrhea: NEGATIVE

## 2014-07-27 LAB — URINE CULTURE

## 2014-07-29 ENCOUNTER — Telehealth (HOSPITAL_COMMUNITY): Payer: Self-pay

## 2014-07-29 NOTE — Telephone Encounter (Signed)
Post ED Visit - Positive Culture Follow-up  Culture report reviewed by antimicrobial stewardship pharmacist: []  Wes Dulaney, Pharm.D., BCPS []  Celedonio Miyamoto, Pharm.D., BCPS []  Georgina Pillion, [address].D., BCPS []  Alex, [place].D., BCPS, AAHIVP []  Estella Husk, Pharm.D., BCPS, AAHIVP []  Elder Cyphers, [address].D., BCPS X  Tegan Magsam, Pharm D  Positive Urine culture, >/= 100,000 colonies -> E. Coli Treated with Sulfa-Trimeth, organism sensitive to the same and no further patient follow-up is required at this time.  Arvid Right 07/29/2014, 5:56 AM

## 2014-08-09 ENCOUNTER — Emergency Department (HOSPITAL_COMMUNITY)
Admission: EM | Admit: 2014-08-09 | Discharge: 2014-08-10 | Disposition: A | Discharge status: Home / Self Care | Payer: No Typology Code available for payment source | Attending: Emergency Medicine | Admitting: Emergency Medicine

## 2014-08-09 ENCOUNTER — Encounter (HOSPITAL_COMMUNITY): Payer: Self-pay | Admitting: Emergency Medicine

## 2014-08-09 DIAGNOSIS — R52 Pain, unspecified: Secondary | ICD-10-CM

## 2014-08-09 DIAGNOSIS — Z880 Allergy status to penicillin: Secondary | ICD-10-CM | POA: Insufficient documentation

## 2014-08-09 DIAGNOSIS — J069 Acute upper respiratory infection, unspecified: Secondary | ICD-10-CM | POA: Insufficient documentation

## 2014-08-09 DIAGNOSIS — J029 Acute pharyngitis, unspecified: Secondary | ICD-10-CM | POA: Insufficient documentation

## 2014-08-09 DIAGNOSIS — R509 Fever, unspecified: Secondary | ICD-10-CM

## 2014-08-09 DIAGNOSIS — Z79899 Other long term (current) drug therapy: Secondary | ICD-10-CM | POA: Insufficient documentation

## 2014-08-09 DIAGNOSIS — R000 Tachycardia, unspecified: Secondary | ICD-10-CM | POA: Insufficient documentation

## 2014-08-09 DIAGNOSIS — J45909 Unspecified asthma, uncomplicated: Secondary | ICD-10-CM | POA: Insufficient documentation

## 2014-08-09 DIAGNOSIS — Z3202 Encounter for pregnancy test, result negative: Secondary | ICD-10-CM | POA: Insufficient documentation

## 2014-08-09 LAB — CBC WITH DIFFERENTIAL/PLATELET
BASOS ABS: 0 10*3/uL (ref 0.0–0.1)
Basophils Relative: 0 % (ref 0–1)
EOS PCT: 0 % (ref 0–5)
Eosinophils Absolute: 0 10*3/uL (ref 0.0–0.7)
HCT: 38.8 % (ref 36.0–46.0)
HEMOGLOBIN: 12.8 g/dL (ref 12.0–15.0)
Lymphocytes Relative: 9 % — ABNORMAL LOW (ref 12–46)
Lymphs Abs: 1.7 10*3/uL (ref 0.7–4.0)
MCH: 27.6 pg (ref 26.0–34.0)
MCHC: 33 g/dL (ref 30.0–36.0)
MCV: 83.8 fL (ref 78.0–100.0)
Monocytes Absolute: 1.4 10*3/uL — ABNORMAL HIGH (ref 0.1–1.0)
Monocytes Relative: 8 % (ref 3–12)
NEUTROS ABS: 15.8 10*3/uL — AB (ref 1.7–7.7)
Neutrophils Relative %: 83 % — ABNORMAL HIGH (ref 43–77)
Platelets: 250 10*3/uL (ref 150–400)
RBC: 4.63 MIL/uL (ref 3.87–5.11)
RDW: 13.3 % (ref 11.5–15.5)
WBC: 19 10*3/uL — AB (ref 4.0–10.5)

## 2014-08-09 LAB — URINE MICROSCOPIC-ADD ON

## 2014-08-09 LAB — POC URINE PREG, ED: Preg Test, Ur: NEGATIVE

## 2014-08-09 LAB — I-STAT CHEM 8, ED
BUN: 4 mg/dL — AB (ref 6–20)
CALCIUM ION: 1.24 mmol/L — AB (ref 1.12–1.23)
CHLORIDE: 103 mmol/L (ref 101–111)
Creatinine, Ser: 0.7 mg/dL (ref 0.44–1.00)
GLUCOSE: 103 mg/dL — AB (ref 65–99)
HCT: 44 % (ref 36.0–46.0)
Hemoglobin: 15 g/dL (ref 12.0–15.0)
POTASSIUM: 3.7 mmol/L (ref 3.5–5.1)
SODIUM: 137 mmol/L (ref 135–145)
TCO2: 20 mmol/L (ref 0–100)

## 2014-08-09 LAB — URINALYSIS, ROUTINE W REFLEX MICROSCOPIC
Bilirubin Urine: NEGATIVE
Glucose, UA: NEGATIVE mg/dL
Hgb urine dipstick: NEGATIVE
KETONES UR: 40 mg/dL — AB
NITRITE: NEGATIVE
PH: 6 (ref 5.0–8.0)
Protein, ur: NEGATIVE mg/dL
Specific Gravity, Urine: 1.008 (ref 1.005–1.030)
Urobilinogen, UA: 1 mg/dL (ref 0.0–1.0)

## 2014-08-09 LAB — RAPID STREP SCREEN (MED CTR MEBANE ONLY): STREPTOCOCCUS, GROUP A SCREEN (DIRECT): NEGATIVE

## 2014-08-09 MED ORDER — IBUPROFEN 400 MG PO TABS
600.0000 mg | ORAL_TABLET | Freq: Once | ORAL | Status: AC
  Administered 2014-08-09: 600 mg via ORAL
  Filled 2014-08-09 (×2): qty 1

## 2014-08-09 MED ORDER — SODIUM CHLORIDE 0.9 % IV BOLUS (SEPSIS)
1000.0000 mL | Freq: Once | INTRAVENOUS | Status: AC
  Administered 2014-08-09: 1000 mL via INTRAVENOUS

## 2014-08-09 MED ORDER — ACETAMINOPHEN 325 MG PO TABS
650.0000 mg | ORAL_TABLET | Freq: Once | ORAL | Status: AC
  Administered 2014-08-09: 650 mg via ORAL

## 2014-08-09 NOTE — ED Provider Notes (Signed)
CSN: [id number]     Arrival date & time 08/09/14  1913 History   First MD Initiated Contact with Patient 08/09/14 2159     Chief Complaint  Patient presents with  . Generalized Body Aches     (Consider location/radiation/quality/duration/timing/severity/associated sxs/prior Treatment) HPI Comments: BARBIE CROSTON is a 24 y.o. female with a PMHx of asthma, who presents to the ED with complaints of fever and chills onset this morning with a max temperature of 101. Associated symptoms include general myalgias, and sore throat which is mild beginning today worse with swallowing and improved with Tylenol given in triage. She states that she had a gradual onset headache after she developed a fever, but it is currently resolved after she was given Tylenol. Initially her headache was 10/10 throbbing generalized constant nonradiating with no specific aggravating factors, and improved entirely with Tylenol. She denies that this was a sudden onset headache or that it's the worst HA of her life. Her temperature after Tylenol is 98.9. She denies any cough, chest pain, shortness breath, wheezing, abdominal pain, nausea, vomiting, diarrhea, constipation, dysuria, hematuria, vaginal bleeding or discharge, numbness, tingling, weakness, neck stiffness, vision changes, lightheadedness, recent insect bites or rashes, tick bites, recent travel, or sick contacts. She states that she frequently gets strep throat at least once a year.  Patient is a 24 y.o. female presenting with pharyngitis. The history is provided by the patient. No language interpreter was used.  Sore Throat This is a new problem. The current episode started today. The problem occurs constantly. The problem has been unchanged. Associated symptoms include chills, a fever (101), headaches (now resolved), myalgias (generalized) and a sore throat. Pertinent negatives include no abdominal pain, arthralgias, chest pain, coughing, nausea, neck pain, numbness,  rash, vomiting or weakness. The symptoms are aggravated by swallowing. She has tried acetaminophen for the symptoms. The treatment provided significant relief.    Past Medical History  Diagnosis Date  . Asthma    Past Surgical History  Procedure Laterality Date  . Wisdom tooth extraction     Family History  Problem Relation Age of Onset  . Diabetes Mother   . Hypertension Mother   . Heart failure Mother   . Heart disease Father   . Stroke Father    History  Substance Use Topics  . Smoking status: Never Smoker   . Smokeless tobacco: Not on file  . Alcohol Use: No   OB History    No data available     Review of Systems  Constitutional: Positive for fever (101) and chills.  HENT: Positive for sore throat. Negative for drooling, ear discharge, ear pain, rhinorrhea and trouble swallowing.   Eyes: Negative for discharge, itching and visual disturbance.  Respiratory: Negative for cough, shortness of breath and wheezing.   Cardiovascular: Negative for chest pain.  Gastrointestinal: Negative for nausea, vomiting, abdominal pain, diarrhea, constipation and blood in stool.  Genitourinary: Negative for dysuria, hematuria, vaginal bleeding and vaginal discharge.  Musculoskeletal: Positive for myalgias (generalized). Negative for arthralgias and neck pain.  Skin: Negative for color change and rash.  Allergic/Immunologic: Negative for immunocompromised state.  Neurological: Positive for headaches (now resolved). Negative for dizziness, weakness, light-headedness and numbness.  Psychiatric/Behavioral: Negative for confusion.   10 Systems reviewed and are negative for acute change except as noted in the HPI.    Allergies  Penicillins  Home Medications   Prior to Admission medications   Medication Sig Start Date End Date Taking? Authorizing Provider  ibuprofen (ADVIL,MOTRIN)  200 MG tablet Take 400-800 mg by mouth every 6 (six) hours as needed for headache or mild pain.   Yes  Historical Provider, MD  Prenatal Multivit-Min-Fe-FA (PRENATAL VITAMINS) 0.8 MG tablet Take 1 tablet by mouth daily.   Yes Historical Provider, MD   BP 106/67 mmHg  Pulse 127  Temp(Src) 100.3 F (37.9 C) (Oral)  Resp 18  Ht 5' (1.524 m)  Wt 136 lb 6.4 oz (61.871 kg)  BMI 26.64 kg/m2  SpO2 100%  LMP 07/11/2014 Physical Exam  Constitutional: She is oriented to person, place, and time. Vital signs are normal. She appears well-developed and well-nourished.  Non-toxic appearance. No distress.  Afebrile, nontoxic, NAD  HENT:  Head: Normocephalic and atraumatic.  Right Ear: Hearing, tympanic membrane, external ear and ear canal normal.  Left Ear: Hearing, tympanic membrane, external ear and ear canal normal.  Nose: Mucosal edema and rhinorrhea present.  Mouth/Throat: Uvula is midline and mucous membranes are normal. No trismus in the jaw. No uvula swelling. Posterior oropharyngeal edema and posterior oropharyngeal erythema present. No oropharyngeal exudate or tonsillar abscesses.  Ears are clear bilaterally. Nose with mild clear rhinorrhea and mucosal edema. Oropharynx mildly erythematous with 1+ tonsils bilaterally, without uvular swelling or deviation, no trismus or drooling, no tonsillar exudates.    Eyes: Conjunctivae and EOM are normal. Pupils are equal, round, and reactive to light. Right eye exhibits no discharge. Left eye exhibits no discharge.  PERRL, EOMI, no nystagmus, no visual field deficits   Neck: Normal range of motion. Neck supple. No spinous process tenderness and no muscular tenderness present. No rigidity. Normal range of motion present.  FROM intact without spinous process TTP, no bony stepoffs or deformities, no paraspinous muscle TTP or muscle spasms. No rigidity or meningeal signs. No bruising or swelling.   Cardiovascular: Regular rhythm, normal heart sounds and intact distal pulses.  Tachycardia present.  Exam reveals no gallop and no friction rub.   No murmur  heard. Mildly tachycardic in the low 100s, reg rhythm, nl s1/s2, no m/r/g, distal pulses intact, no pedal edema  Pulmonary/Chest: Effort normal and breath sounds normal. No respiratory distress. She has no decreased breath sounds. She has no wheezes. She has no rhonchi. She has no rales.  CTAB in all lung fields, no w/r/r, no hypoxia or increased WOB, speaking in full sentences, SpO2 100% on RA   Abdominal: Soft. Normal appearance and bowel sounds are normal. She exhibits no distension. There is no tenderness. There is no rigidity, no rebound, no guarding, no CVA tenderness, no tenderness at McBurney's point and negative Murphy's sign.  Musculoskeletal: Normal range of motion.  MAE x4 Strength and sensation grossly intact Distal pulses intact Gait steady  Lymphadenopathy:       Head (right side): Tonsillar adenopathy present. No submandibular adenopathy present.       Head (left side): Tonsillar adenopathy present. No submandibular adenopathy present.    She has cervical adenopathy.       Right cervical: Superficial cervical and posterior cervical adenopathy present.       Left cervical: Superficial cervical and posterior cervical adenopathy present.  Tonsillar, anterior cervical, and posterior cervical LAD bilaterally which is TTP  Neurological: She is alert and oriented to person, place, and time. She has normal strength. No cranial nerve deficit or sensory deficit. Coordination and gait normal. GCS eye subscore is 4. GCS verbal subscore is 5. GCS motor subscore is 6.  CN 2-12 grossly intact A&O x4 GCS 15 Sensation and  strength intact Gait steady Coordination WNL  Skin: Skin is warm, dry and intact. No rash noted.  No rashes  Psychiatric: She has a normal mood and affect.  Nursing note and vitals reviewed.   ED Course  Procedures (including critical care time) Labs Review Labs Reviewed  CBC WITH DIFFERENTIAL/PLATELET - Abnormal; Notable for the following:    WBC 19.0 (*)     Neutrophils Relative % 83 (*)    Neutro Abs 15.8 (*)    Lymphocytes Relative 9 (*)    Monocytes Absolute 1.4 (*)    All other components within normal limits  URINALYSIS, ROUTINE W REFLEX MICROSCOPIC (NOT AT [facility]) - Abnormal; Notable for the following:    Ketones, ur 40 (*)    Leukocytes, UA TRACE (*)    All other components within normal limits  URINE MICROSCOPIC-ADD ON - Abnormal; Notable for the following:    Squamous Epithelial / LPF FEW (*)    All other components within normal limits  I-STAT CHEM 8, ED - Abnormal; Notable for the following:    BUN 4 (*)    Glucose, Bld 103 (*)    Calcium, Ion 1.24 (*)    All other components within normal limits  RAPID STREP SCREEN (NOT AT [facility])  CULTURE, GROUP A STREP  POC URINE PREG, ED    Imaging Review No results found.   EKG Interpretation None      MDM   Final diagnoses:  Pharyngitis  Fever, unspecified fever cause  Body aches  URI (upper respiratory infection)    24 y.o. female here with generalized body aches and fever of 101, mildly tachycardic initially. Given tylenol in triage prior to exam, and tachycardia improving, temp normalized. States she had HA when she was febrile but this resolved with tylenol. No red flag s/sx of headache, no meningismus, not the worst of her life, doubt meningitis. Only symptom otherwise is sore throat. Throat mildly erythematous with some tonsillar edema, no exudates. RST neg. Could be mono but too early to test. +Tonsillar, anterior cervical, and posterior cervical LAD. Chem 8 WNL. CBC showing leukocytosis of 19. Could be viral etiology or early bacterial URI, could be strep since CENTOR criteria high. Will give fluids to help with tachycardia, and ibuprofen to help with myalgias. Will await u/a and upreg. Will reassess shortly.   12:47 AM U/A with signs of dehydration but otherwise unremarkable. Upreg neg. Pt continues to be afebrile. Tachycardia resolved, upon reexamination pt asleep and HR  95, BP 102/68. Pt feels better. Discussed that although RST is neg, she has high centor criteria and I feel it's reasonable to send home with safety script. If fevers persist and sore throat worsens, start abx. Discussed that culture will return in 1-2 days and they'll call if positive. Discussed that this could be viral. Also discussed strict return precautions for meningitis but given no meningeal symptoms today and low clinical suspicion for this, doubt need for further work up for this. Will have her f/up with PCP in 5 days. Discussed OTC symptomatic care. I explained the diagnosis and have given explicit precautions to return to the ER including for any other new or worsening symptoms. The patient understands and accepts the medical plan as it's been dictated and I have answered their questions. Discharge instructions concerning home care and prescriptions have been given. The patient is STABLE and is discharged to home in good condition.  BP 102/68 mmHg  Pulse 95  Temp(Src) 98.6 F (37 C) (Oral)  Resp 18  Ht 5' (1.524 m)  Wt 136 lb 6.4 oz (61.871 kg)  BMI 26.64 kg/m2  SpO2 100%  LMP 07/11/2014  Meds ordered this encounter  Medications  . acetaminophen (TYLENOL) tablet 650 mg    Sig:   . ibuprofen (ADVIL,MOTRIN) 200 MG tablet    Sig: Take 400-800 mg by mouth every 6 (six) hours as needed for headache or mild pain.  Marland Kitchen ibuprofen (ADVIL,MOTRIN) tablet 600 mg    Sig:   . sodium chloride 0.9 % bolus 1,000 mL    Sig:   . azithromycin (ZITHROMAX Z-PAK) 250 MG tablet    Sig: 2 po day one, then 1 daily x 4 days    Dispense:  5 tablet    Refill:  0    Order Specific Question:  Supervising Provider    Answer:  Eber Hong [[id number]]     Joscelyn Hardrick Camprubi-Soms, PA-C 08/10/14 0050  Benjiman Core, MD 08/12/14 [phone]

## 2014-08-09 NOTE — ED Notes (Signed)
Pt c/o generalized body aches with fever and chills.  Onset this am.

## 2014-08-10 MED ORDER — AZITHROMYCIN 250 MG PO TABS: ORAL_TABLET | ORAL | Status: DC

## 2014-08-10 NOTE — Discharge Instructions (Signed)
Continue to stay well-hydrated. Gargle warm salt water and spit it out. Use chloraseptic spray as needed for sore throat. Continue to alternate between Tylenol and Ibuprofen for pain or fever. Start taking the antibiotic as directed if your symptoms worsen and your fevers continue, but you strep test was negative here so if it cultures out as strep the lab will call you and at that time you could start the antibiotic. Use netipot and flonase to help with nasal congestion. May consider over-the-counter Benadryl or other antihistamine to decrease secretions and for watery itchy eyes. Followup with your primary care doctor in 5-7 days for recheck of ongoing symptoms. Return to emergency department for emergent changing or worsening of symptoms.   Fever, Adult A fever is a temperature of 100.4 F (38 C) or above.  HOME CARE  Take fever medicine as told by your doctor. Do not  take aspirin for fever if you are younger than 23 years of age.  If you are given antibiotic medicine, take it as told. Finish the medicine even if you start to feel better.  Rest.  Drink enough fluids to keep your pee (urine) clear or pale yellow. Do not drink alcohol.  Take a bath or shower with room temperature water. Do not use ice water or alcohol sponge baths.  Wear lightweight, loose clothes. GET HELP RIGHT AWAY IF:   You are short of breath or have trouble breathing.  You are very weak.  You are dizzy or you pass out (faint).  You are very thirsty or are making little or no urine.  You have new pain.  You throw up (vomit) or have watery poop (diarrhea).  You keep throwing up or having watery poop for more than 1 to 2 days.  You have a stiff neck or light bothers your eyes.  You have a skin rash.  You have a fever or problems (symptoms) that last for more than 2 to 3 days.  You have a fever and your problems quickly get worse.  You keep throwing up the fluids you drink.  You do not feel better  after 3 days.  You have new problems. MAKE SURE YOU:   Understand these instructions.  Will watch your condition.  Will get help right away if you are not doing well or get worse. Document Released: 11/12/2007 Document Revised: 04/27/2011 Document Reviewed: 12/04/2010 [facility] Patient Information 2015 [place], [place]. This information is not intended to replace advice given to you by your health care provider. Make sure you discuss any questions you have with your health care provider.  Sore Throat A sore throat is a painful, burning, sore, or scratchy feeling of the throat. There may be pain or tenderness when swallowing or talking. You may have other symptoms with a sore throat. These include coughing, sneezing, fever, or a swollen neck. A sore throat is often the first sign of another sickness. These sicknesses may include a cold, flu, strep throat, or an infection called mono. Most sore throats go away without medical treatment.  HOME CARE   Only take medicine as told by your doctor.  Drink enough fluids to keep your pee (urine) clear or pale yellow.  Rest as needed.  Try using throat sprays, lozenges, or suck on hard candy (if older than 4 years or as told).  Sip warm liquids, such as broth, herbal tea, or warm water with honey. Try sucking on frozen ice pops or drinking cold liquids.  Rinse the mouth (gargle)  with salt water. Mix 1 teaspoon salt with 8 ounces of water.  Do not smoke. Avoid being around others when they are smoking.  Put a humidifier in your bedroom at night to moisten the air. You can also turn on a hot shower and sit in the bathroom for 5-10 minutes. Be sure the bathroom door is closed. GET HELP RIGHT AWAY IF:   You have trouble breathing.  You cannot swallow fluids, soft foods, or your spit (saliva).  You have more puffiness (swelling) in the throat.  Your sore throat does not get better in 7 days.  You feel sick to your stomach (nauseous) and throw  up (vomit).  You have a fever or lasting symptoms for more than 2-3 days.  You have a fever and your symptoms suddenly get worse. MAKE SURE YOU:   Understand these instructions.  Will watch your condition.  Will get help right away if you are not doing well or get worse. Document Released: 11/12/2007 Document Revised: 10/28/2011 Document Reviewed: 10/11/2011 [facility] Patient Information 2015 [place], [place]. This information is not intended to replace advice given to you by your health care provider. Make sure you discuss any questions you have with your health care provider.  Salt Water Gargle This solution will help make your mouth and throat feel better. HOME CARE INSTRUCTIONS   Mix 1 teaspoon of salt in 8 ounces of warm water.  Gargle with this solution as much or often as you need or as directed. Swish and gargle gently if you have any sores or wounds in your mouth.  Do not swallow this mixture. Document Released: 11/07/2003 Document Revised: 04/27/2011 Document Reviewed: 03/30/2008 [facility] Patient Information 2015 [place], [place]. This information is not intended to replace advice given to you by your health care provider. Make sure you discuss any questions you have with your health care provider.  Pharyngitis Pharyngitis is a sore throat (pharynx). There is redness, pain, and swelling of your throat. HOME CARE   Drink enough fluids to keep your pee (urine) clear or pale yellow.  Only take medicine as told by your doctor.  You may get sick again if you do not take medicine as told. Finish your medicines, even if you start to feel better.  Do not take aspirin.  Rest.  Rinse your mouth (gargle) with salt water ( tsp of salt per 1 qt of water) every 1-2 hours. This will help the pain.  If you are not at risk for choking, you can suck on hard candy or sore throat lozenges. GET HELP IF:  You have large, tender lumps on your neck.  You have a rash.  You cough up  green, yellow-brown, or bloody spit. GET HELP RIGHT AWAY IF:   You have a stiff neck.  You drool or cannot swallow liquids.  You throw up (vomit) or are not able to keep medicine or liquids down.  You have very bad pain that does not go away with medicine.  You have problems breathing (not from a stuffy nose). MAKE SURE YOU:   Understand these instructions.  Will watch your condition.  Will get help right away if you are not doing well or get worse. Document Released: 07/22/2007 Document Revised: 11/23/2012 Document Reviewed: 10/10/2012 [facility] Patient Information 2015 [place], [place]. This information is not intended to replace advice given to you by your health care provider. Make sure you discuss any questions you have with your health care provider.  Upper Respiratory Infection, Adult An  upper respiratory infection (URI) is also known as the common cold. It is often caused by a type of germ (virus). Colds are easily spread (contagious). You can pass it to others by kissing, coughing, sneezing, or drinking out of the same glass. Usually, you get better in 1 or 2 weeks.  HOME CARE   Only take medicine as told by your doctor.  Use a warm mist humidifier or breathe in steam from a hot shower.  Drink enough water and fluids to keep your pee (urine) clear or pale yellow.  Get plenty of rest.  Return to work when your temperature is back to normal or as told by your doctor. You may use a face mask and wash your hands to stop your cold from spreading. GET HELP RIGHT AWAY IF:   After the first few days, you feel you are getting worse.  You have questions about your medicine.  You have chills, shortness of breath, or brown or red spit (mucus).  You have yellow or brown snot (nasal discharge) or pain in the face, especially when you bend forward.  You have a fever, puffy (swollen) neck, pain when you swallow, or white spots in the back of your throat.  You have a bad  headache, ear pain, sinus pain, or chest pain.  You have a high-pitched whistling sound when you breathe in and out (wheezing).  You have a lasting cough or cough up blood.  You have sore muscles or a stiff neck. MAKE SURE YOU:   Understand these instructions.  Will watch your condition.  Will get help right away if you are not doing well or get worse. Document Released: 07/22/2007 Document Revised: 04/27/2011 Document Reviewed: 05/10/2013 [facility] Patient Information 2015 [place], [place]. This information is not intended to replace advice given to you by your health care provider. Make sure you discuss any questions you have with your health care provider.

## 2014-08-13 LAB — CULTURE, GROUP A STREP: STREP A CULTURE: NEGATIVE

## 2014-09-25 ENCOUNTER — Emergency Department (HOSPITAL_COMMUNITY)
Admission: EM | Admit: 2014-09-25 | Discharge: 2014-09-25 | Disposition: A | Discharge status: Home / Self Care | Payer: No Typology Code available for payment source | Attending: Emergency Medicine | Admitting: Emergency Medicine

## 2014-09-25 ENCOUNTER — Encounter (HOSPITAL_COMMUNITY): Payer: Self-pay | Admitting: Emergency Medicine

## 2014-09-25 DIAGNOSIS — Z79899 Other long term (current) drug therapy: Secondary | ICD-10-CM | POA: Insufficient documentation

## 2014-09-25 DIAGNOSIS — Z3202 Encounter for pregnancy test, result negative: Secondary | ICD-10-CM | POA: Insufficient documentation

## 2014-09-25 DIAGNOSIS — J45909 Unspecified asthma, uncomplicated: Secondary | ICD-10-CM | POA: Insufficient documentation

## 2014-09-25 DIAGNOSIS — Z792 Long term (current) use of antibiotics: Secondary | ICD-10-CM | POA: Insufficient documentation

## 2014-09-25 DIAGNOSIS — N926 Irregular menstruation, unspecified: Secondary | ICD-10-CM

## 2014-09-25 DIAGNOSIS — N911 Secondary amenorrhea: Secondary | ICD-10-CM | POA: Insufficient documentation

## 2014-09-25 DIAGNOSIS — N949 Unspecified condition associated with female genital organs and menstrual cycle: Secondary | ICD-10-CM | POA: Insufficient documentation

## 2014-09-25 DIAGNOSIS — Z880 Allergy status to penicillin: Secondary | ICD-10-CM | POA: Insufficient documentation

## 2014-09-25 LAB — POC URINE PREG, ED: Preg Test, Ur: NEGATIVE

## 2014-09-25 NOTE — Discharge Instructions (Signed)
Menstruation °Menstruation is the monthly passing of blood, tissue, fluid and mucus, also know as a period. Your body is shedding the lining of the uterus. The flow, or amount of blood, usually lasts from 3-7 days each month. Hormones control the menstrual cycle. Hormones are a chemical substance produced by endocrine glands in the body to regulate different bodily functions. °The first menstrual period may start any time between age 23 years to 16 years. However, it usually starts around age 12 years. Some girls have regular monthly menstrual cycles right from the beginning. However, it is not unusual to have only a couple of drops of blood or spotting when you first start menstruating. It is also not unusual to have two periods a month or miss a month or two when first starting your periods. °SYMPTOMS  °· Mild to moderate abdominal cramps. °· Aching or pain in the lower back area. °Symptoms may occur 5-10 days before your menstrual period starts. These symptoms are referred to as premenstrual syndrome (PMS). These symptoms can include: °· Headache. °· Breast tenderness and swelling. °· Bloating. °· Tiredness (fatigue). °· Mood changes. °· Craving for certain foods. °These are normal signs and symptoms and can vary in severity. To help relieve these problems, ask your caregiver if you can take over-the-counter medications for pain or discomfort. If the symptoms are not controllable, see your caregiver for help.  °HORMONES INVOLVED IN MENSTRUATION °Menstruation comes about because of hormones produced by the pituitary gland in the brain and the ovaries that affect the uterine lining. °First, the pituitary gland in the brain produces the hormone follicle stimulating hormone (FSH). FSH stimulates the ovaries to produce estrogen, which thickens the uterine lining and begins to develop an egg in the ovary. About 14 days later, the pituitary gland produces another hormone called luteinizing hormone (LH). LH causes the egg  to come out of a sac in the ovary (ovulation). The empty sac on the ovary called the corpus luteum is stimulated by another hormone from the pituitary gland called luteotropin. The corpus luteum begins to produce the estrogen and progesterone hormone. The progesterone hormone prepares the lining of the uterus to have the fertilized egg (egg combined with sperm) attach to the lining of the uterus and begin to develop into a fetus. If the egg is not fertilized, the corpus luteum stops producing estrogen and progesterone, it disappears, the lining of the uterus sloughs off and a menstrual period begins. Then the menstrual cycle starts all over again and will continue monthly unless pregnancy occurs or menopause begins. °The secretion of hormones is complex. Various parts of the body become involved in many chemical activities. Female sex hormones have other functions in a woman's body as well. Estrogen increases a woman's sex drive (libido). It naturally helps body get rid of fluids (diuretic). It also aids in the process of building new bone. Therefore, maintaining hormonal health is essential to all levels of a woman's well being. These hormones are usually present in normal amounts and cause you to menstruate. It is the relationship between the (small) levels of the hormones that is critical. When the balance is upset, menstrual irregularities can occur. °HOW DOES THE MENSTRUAL CYCLE HAPPEN? °· Menstrual cycles vary in length from 21-35 days with an average of 29 days. The cycle begins on the first day of bleeding. At this time, the pituitary gland in the brain releases FSH that travels through the bloodstream to the ovaries. The FSH stimulates the follicles in the   ovaries. This prepares the body for ovulation that occurs around the 14th day of the cycle. The ovaries produce estrogen, and this makes sure conditions are right in the uterus for implantation of the fertilized egg. °· When the levels of estrogen reach a  high enough level, it signals the gland in the brain (pituitary gland) to release a surge of LH. This causes the release of the ripest egg from its follicle (ovulation). Usually only one follicle releases one egg, but sometimes more than one follicle releases an egg especially when stimulating the ovaries for in vitro fertilization. The egg can then be collected by either fallopian tube to await fertilization. The burst follicle within the ovary that is left behind is now called the corpus luteum or "yellow body." The corpus luteum continues to give off (secrete) reduced amounts of estrogen. This closes and hardens the cervix. It dries up the mucus to the naturally infertile condition. °· The corpus luteum also begins to give off greater amounts of progesterone. This causes the lining of the uterus (endometrium) to thicken even more in preparation for the fertilized egg. The egg is starting to journey down from the fallopian tube to the uterus. It also signals the ovaries to stop releasing eggs. It assists in returning the cervical mucus to its infertile state. °· If the egg implants successfully into the womb lining and pregnancy occurs, progesterone levels will continue to raise. It is often this hormone that gives some pregnant women a feeling of well being, like a "natural high." Progesterone levels drop again after childbirth. °· If fertilization does not occur, the corpus luteum dies, stopping the production of hormones. This sudden drop in progesterone causes the uterine lining to break down, accompanied by blood (menstruation). °· This starts the cycle back at day 1. The whole process starts all over again. Woman go through this cycle every month from puberty to menopause. Women have breaks only for pregnancy and breastfeeding (lactation), unless the woman has health problems that affect the female hormone system or chooses to use oral contraceptives to have unnatural menstrual periods. °HOME CARE  INSTRUCTIONS  °· Keep track of your periods by using a calendar. °· If you use tampons, get the least absorbent to avoid toxic shock syndrome. °· Do not leave tampons in the vagina over night or longer than 6 hours. °· Wear a sanitary pad over night. °· Exercise 3-5 times a week or more. °· Avoid foods and drinks that you know will make your symptoms worse before or during your period. °SEEK MEDICAL CARE IF:  °· You develop a fever with your period. °· Your periods are lasting more than 7 days. °· Your period is so heavy that you have to change pads or tampons every 30 minutes. °· You develop clots with your period and never had clots before. °· You cannot get relief from over-the-counter medication for your symptoms. °· Your period has not started, and it has been longer than 35 days. °Document Released: 01/23/2002 Document Revised: 02/07/2013 Document Reviewed: 09/01/2012 °[employer]® Patient Information ©2015 [employer]. This information is not intended to replace advice given to you by your health care provider. Make sure you discuss any questions you have with your health care provider. ° °

## 2014-09-25 NOTE — ED Notes (Signed)
Declined W/C at D/C and was escorted to lobby by RN. 

## 2014-09-25 NOTE — ED Provider Notes (Addendum)
CSN: [id number]     Arrival date & time 09/25/14  0912 History   First MD Initiated Contact with Patient 09/25/14 [phone]     Chief Complaint  Patient presents with  . Possible Pregnancy     (Consider location/radiation/quality/duration/timing/severity/associated sxs/prior Treatment) Patient is a 24 y.o. female presenting with pregnancy problem. The history is provided by the patient.  Possible Pregnancy This is a new problem. Episode onset: about 1.5 weeks ago. The problem occurs constantly. The problem has not changed since onset.Associated symptoms comments: occassional nausea and some stomach cramping.  LMP was at the end of June. Nothing aggravates the symptoms. Nothing relieves the symptoms. She has tried nothing for the symptoms.    Past Medical History  Diagnosis Date  . Asthma    Past Surgical History  Procedure Laterality Date  . Wisdom tooth extraction     Family History  Problem Relation Age of Onset  . Diabetes Mother   . Hypertension Mother   . Heart failure Mother   . Heart disease Father   . Stroke Father    History  Substance Use Topics  . Smoking status: Never Smoker   . Smokeless tobacco: Not on file  . Alcohol Use: No   OB History    No data available     Review of Systems  All other systems reviewed and are negative.     Allergies  Penicillins  Home Medications   Prior to Admission medications   Medication Sig Start Date End Date Taking? Authorizing Provider  azithromycin (ZITHROMAX Z-PAK) 250 MG tablet 2 po day one, then 1 daily x 4 days 08/10/14   Mercedes Camprubi-Soms, PA-C  ibuprofen (ADVIL,MOTRIN) 200 MG tablet Take 400-800 mg by mouth every 6 (six) hours as needed for headache or mild pain.    Historical Provider, MD  Prenatal Multivit-Min-Fe-FA (PRENATAL VITAMINS) 0.8 MG tablet Take 1 tablet by mouth daily.    Historical Provider, MD   BP 134/74 mmHg  Pulse 103  Temp(Src) 99.1 F (37.3 C) (Oral)  Resp 20  SpO2 100%  LMP  07/26/2014 Physical Exam  Constitutional: She is oriented to person, place, and time. She appears well-developed and well-nourished. No distress.  HENT:  Head: Normocephalic and atraumatic.  Mouth/Throat: Oropharynx is clear and moist.  Eyes: Conjunctivae and EOM are normal. Pupils are equal, round, and reactive to light.  Neck: Normal range of motion. Neck supple.  Cardiovascular: Normal rate, regular rhythm and intact distal pulses.   No murmur heard. Pulmonary/Chest: Effort normal and breath sounds normal. No respiratory distress. She has no wheezes. She has no rales.  Abdominal: Soft. She exhibits no distension. There is no tenderness. There is no rebound and no guarding.  Musculoskeletal: Normal range of motion. She exhibits no edema or tenderness.  Neurological: She is alert and oriented to person, place, and time.  Skin: Skin is warm and dry. No rash noted. No erythema.  Psychiatric: She has a normal mood and affect. Her behavior is normal.  Nursing note and vitals reviewed.   ED Course  Procedures (including critical care time) Labs Review Labs Reviewed  POC URINE PREG, ED    Imaging Review No results found.   EKG Interpretation None      MDM   Final diagnoses:  Amenorrhea, secondary  Menstrual cycle disorder    Patient coming in requesting a pregnancy test. She has not had a period in 5-1/2 weeks and had some mild nausea and stomach cramping. She has taken  3 test at home last week all which were negative. She denies any vaginal bleeding, discharge, fever or dysuria. She currently is taking no birth control but had been on depo.  She states. Have become regular about May.  Currently having unprotected sex with her boyfriend.  Urine pregnancy test is negative. [facility] does not receive her period within the next month and check another pregnancy test in a few weeks.    Gwyneth Sprout, MD 09/25/14  1610  Gwyneth Sprout, MD 09/25/14 1036

## 2014-09-25 NOTE — ED Notes (Signed)
Pt. Stated, I've not had a period and I wan to know if Im pregnant.

## 2014-10-11 ENCOUNTER — Emergency Department (INDEPENDENT_AMBULATORY_CARE_PROVIDER_SITE_OTHER)
Admission: EM | Admit: 2014-10-11 | Discharge: 2014-10-11 | Disposition: A | Discharge status: Home / Self Care | Payer: Self-pay | Source: Home / Self Care | Attending: Family Medicine | Admitting: Family Medicine

## 2014-10-11 ENCOUNTER — Encounter (HOSPITAL_COMMUNITY): Payer: Self-pay | Admitting: Emergency Medicine

## 2014-10-11 DIAGNOSIS — R110 Nausea: Secondary | ICD-10-CM

## 2014-10-11 LAB — POCT PREGNANCY, URINE: Preg Test, Ur: NEGATIVE

## 2014-10-11 MED ORDER — ONDANSETRON 4 MG PO TBDP
4.0000 mg | ORAL_TABLET | Freq: Once | ORAL | Status: AC
  Administered 2014-10-11: 4 mg via ORAL

## 2014-10-11 MED ORDER — ONDANSETRON HCL 4 MG PO TABS: 4.0000 mg | ORAL_TABLET | Freq: Four times a day (QID) | ORAL | Status: DC

## 2014-10-11 MED ORDER — ONDANSETRON 4 MG PO TBDP
ORAL_TABLET | ORAL | Status: AC
  Filled 2014-10-11: qty 1

## 2014-10-11 NOTE — ED Notes (Signed)
C/o nausea, last period was June 26.  Last depo shot was October 2015.

## 2014-10-11 NOTE — ED Provider Notes (Signed)
CSN: [id number]    [id number] date & time 10/11/14  1422 History   None    Chief Complaint  Patient presents with  . Nausea  . Possible Pregnancy   (Consider location/radiation/quality/duration/timing/severity/associated sxs/prior Treatment) HPI Comments: 24 year old female presents with complaints of nausea since chest today. She had no vomiting. No associated abdominal pain. Her last menses was August 12. She states that it was not at the usual time in which she would expect to have her menses. Ironically she was seen in the emergency department on August 9 to have a pregnancy test as well. She is not using any form of birth control and states she is trying to get pregnant.   Past Medical History  Diagnosis Date  . Asthma    Past Surgical History  Procedure Laterality Date  . Wisdom tooth extraction     Family History  Problem Relation Age of Onset  . Diabetes Mother   . Hypertension Mother   . Heart failure Mother   . Heart disease Father   . Stroke Father    Social History  Substance Use Topics  . Smoking status: Never Smoker   . Smokeless tobacco: None  . Alcohol Use: No   OB History    No data available     Review of Systems  Constitutional: Positive for activity change. Negative for fever and fatigue.  HENT: Negative.   Respiratory: Negative.   Cardiovascular: Negative for chest pain.  Gastrointestinal: Positive for nausea. Negative for vomiting, abdominal pain, diarrhea and constipation.  Musculoskeletal: Negative.   Neurological: Negative for dizziness, syncope and numbness.    Allergies  Penicillins  Home Medications   Prior to Admission medications   Medication Sig Start Date End Date Taking? Authorizing Provider  ibuprofen (ADVIL,MOTRIN) 200 MG tablet Take 400-800 mg by mouth every 6 (six) hours as needed for headache or mild pain.    Historical Provider, MD  ondansetron (ZOFRAN) 4 MG tablet Take 1 tablet (4 mg total) by mouth every 6 (six) hours.  10/11/14   Hayden Rasmussen, NP  Prenatal Multivit-Min-Fe-FA (PRENATAL VITAMINS) 0.8 MG tablet Take 1 tablet by mouth daily.    Historical Provider, MD   BP 123/79 mmHg  Pulse 76  Temp(Src) 98.7 F (37.1 C) (Oral)  Resp 20  SpO2 99%  LMP 08/12/2014 Physical Exam  Constitutional: She is oriented to person, place, and time. She appears well-developed and well-nourished. No distress.  Eyes: EOM are normal.  Neck: Normal range of motion. Neck supple.  Cardiovascular: Normal rate.   Pulmonary/Chest: Effort normal. No respiratory distress.  Abdominal: Soft. There is no tenderness.  Musculoskeletal: She exhibits no edema.  Neurological: She is alert and oriented to person, place, and time. She exhibits normal muscle tone.  Skin: Skin is warm and dry.  Psychiatric: She has a normal mood and affect.  Nursing note and vitals reviewed.   ED Course  Procedures (including critical care time) Labs Review Labs Reviewed  POCT PREGNANCY, URINE   Results for orders placed or performed during the hospital encounter of 10/11/14  Pregnancy, urine POC  Result Value Ref Range   Preg Test, Ur NEGATIVE NEGATIVE     Imaging Review No results found.   MDM   1. Nausea   exam unremarkable. Pt adm zofran 4 mg po in clinic. zofran as dir Clear liquids today Obtain  PCP ASAP    Hayden Rasmussen, NP 10/11/14 1535

## 2014-10-11 NOTE — Discharge Instructions (Signed)

## 2014-12-05 ENCOUNTER — Inpatient Hospital Stay (HOSPITAL_COMMUNITY)
Admission: AD | Admit: 2014-12-05 | Discharge: 2014-12-05 | Disposition: A | Discharge status: Home / Self Care | Payer: Self-pay | Source: Ambulatory Visit | Attending: Family Medicine | Admitting: Family Medicine

## 2014-12-05 ENCOUNTER — Encounter (HOSPITAL_COMMUNITY): Payer: Self-pay | Admitting: *Deleted

## 2014-12-05 DIAGNOSIS — Z880 Allergy status to penicillin: Secondary | ICD-10-CM | POA: Insufficient documentation

## 2014-12-05 DIAGNOSIS — R110 Nausea: Secondary | ICD-10-CM | POA: Insufficient documentation

## 2014-12-05 DIAGNOSIS — N390 Urinary tract infection, site not specified: Secondary | ICD-10-CM

## 2014-12-05 DIAGNOSIS — Z3202 Encounter for pregnancy test, result negative: Secondary | ICD-10-CM

## 2014-12-05 DIAGNOSIS — R109 Unspecified abdominal pain: Secondary | ICD-10-CM | POA: Insufficient documentation

## 2014-12-05 DIAGNOSIS — J45909 Unspecified asthma, uncomplicated: Secondary | ICD-10-CM | POA: Insufficient documentation

## 2014-12-05 LAB — URINALYSIS, ROUTINE W REFLEX MICROSCOPIC
Bilirubin Urine: NEGATIVE
Glucose, UA: NEGATIVE mg/dL
Hgb urine dipstick: NEGATIVE
Ketones, ur: 15 mg/dL — AB
LEUKOCYTES UA: NEGATIVE
NITRITE: POSITIVE — AB
Protein, ur: NEGATIVE mg/dL
Specific Gravity, Urine: 1.02 (ref 1.005–1.030)
UROBILINOGEN UA: 1 mg/dL (ref 0.0–1.0)
pH: 6.5 (ref 5.0–8.0)

## 2014-12-05 LAB — URINE MICROSCOPIC-ADD ON

## 2014-12-05 LAB — POCT PREGNANCY, URINE: Preg Test, Ur: NEGATIVE

## 2014-12-05 LAB — CBC
HEMATOCRIT: 37.6 % (ref 36.0–46.0)
Hemoglobin: 12.4 g/dL (ref 12.0–15.0)
MCH: 27.9 pg (ref 26.0–34.0)
MCHC: 33 g/dL (ref 30.0–36.0)
MCV: 84.5 fL (ref 78.0–100.0)
PLATELETS: 281 10*3/uL (ref 150–400)
RBC: 4.45 MIL/uL (ref 3.87–5.11)
RDW: 13.6 % (ref 11.5–15.5)
WBC: 9.8 10*3/uL (ref 4.0–10.5)

## 2014-12-05 MED ORDER — OXYCODONE-ACETAMINOPHEN 5-325 MG PO TABS: 2.0000 | ORAL_TABLET | ORAL | Status: DC | PRN

## 2014-12-05 MED ORDER — SULFAMETHOXAZOLE-TRIMETHOPRIM 800-160 MG PO TABS: 1.0000 | ORAL_TABLET | Freq: Two times a day (BID) | ORAL | Status: AC

## 2014-12-05 MED ORDER — PROMETHAZINE HCL 25 MG PO TABS
12.5000 mg | ORAL_TABLET | Freq: Four times a day (QID) | ORAL | Status: DC | PRN
Start: 2014-12-05 — End: 2015-10-28

## 2014-12-05 NOTE — MAU Provider Note (Signed)
History     CSN: [id number]  Arrival date and time: 12/05/14 1511   First Provider Initiated Contact with Patient 12/05/14 1800         Chief Complaint  Patient presents with  . Abdominal Pain  . Nausea   HPI Janice White is a 24 y.o. female who presents with left flank pain, nausea, & possible pregnancy. Concerned for pregnancy d/t irregular periods. Has had irregular periods since discontinuing depo provera last year. Has not had positive pregnancy test.  Left flank pain x 3 days. Rates 7/10. Describes as sharp & intermittent. No treatment.  Some nausea, no vomiting. Denies fever, dysuria, or hematuria.     OB History    Gravida Para Term Preterm AB TAB SAB Ectopic Multiple Living   0 0 0 0 0 0 0 0 0 0       Past Medical History  Diagnosis Date  . Asthma     Past Surgical History  Procedure Laterality Date  . Wisdom tooth extraction      Family History  Problem Relation Age of Onset  . Diabetes Mother   . Hypertension Mother   . Heart failure Mother   . Heart disease Father   . Stroke Father     Social History  Substance Use Topics  . Smoking status: Never Smoker   . Smokeless tobacco: Never Used  . Alcohol Use: No    Allergies:  Allergies  Allergen Reactions  . Penicillins Itching    Prescriptions prior to admission  Medication Sig Dispense Refill Last Dose  . ibuprofen (ADVIL,MOTRIN) 200 MG tablet Take 400-800 mg by mouth every 6 (six) hours as needed for headache or mild pain.   08/08/2014 at Unknown time  . ondansetron (ZOFRAN) 4 MG tablet Take 1 tablet (4 mg total) by mouth every 6 (six) hours. 12 tablet 0   . Prenatal Multivit-Min-Fe-FA (PRENATAL VITAMINS) 0.8 MG tablet Take 1 tablet by mouth daily.   Past Week at Unknown time    Review of Systems  Constitutional: Negative for fever and chills.  Gastrointestinal: Positive for nausea. Negative for vomiting, abdominal pain, diarrhea and constipation.  Genitourinary: Positive for flank  pain. Negative for dysuria, urgency, frequency and hematuria.  Musculoskeletal: Negative for back pain.   Physical Exam   Blood pressure 116/77, pulse 84, temperature 98.7 F (37.1 C), temperature source Oral, resp. rate 18, last menstrual period 11/23/2014.  Physical Exam  Nursing note and vitals reviewed. Constitutional: She is oriented to person, place, and time. She appears well-developed and well-nourished. No distress.  HENT:  Head: Normocephalic and atraumatic.  Eyes: Conjunctivae are normal. Right eye exhibits no discharge. Left eye exhibits no discharge. No scleral icterus.  Neck: Normal range of motion.  Cardiovascular: Normal rate, regular rhythm and normal heart sounds.   No murmur heard. Respiratory: Effort normal and breath sounds normal. No respiratory distress. She has no wheezes.  GI: Soft. Bowel sounds are normal. She exhibits no distension. There is no tenderness. There is no CVA tenderness.  Neurological: She is alert and oriented to person, place, and time.  Skin: Skin is warm and dry. She is not diaphoretic.  Psychiatric: She has a normal mood and affect. Her behavior is normal. Judgment and thought content normal.    MAU Course  Procedures Results for orders placed or performed during the hospital encounter of 12/05/14 (from the past 24 hour(s))  Urinalysis, Routine w reflex microscopic ([facility])     Status: Abnormal  Collection Time: 12/05/14  3:35 PM  Result Value Ref Range   Color, Urine YELLOW YELLOW   APPearance CLEAR CLEAR   Specific Gravity, Urine 1.020 1.005 - 1.030   pH 6.5 5.0 - 8.0   Glucose, UA NEGATIVE NEGATIVE mg/dL   Hgb urine dipstick NEGATIVE NEGATIVE   Bilirubin Urine NEGATIVE NEGATIVE   Ketones, ur 15 (A) NEGATIVE mg/dL   Protein, ur NEGATIVE NEGATIVE mg/dL   Urobilinogen, UA 1.0 0.0 - 1.0 mg/dL   Nitrite POSITIVE (A) NEGATIVE   Leukocytes, UA NEGATIVE NEGATIVE  Urine microscopic-add on     Status: Abnormal   Collection Time:  12/05/14  3:35 PM  Result Value Ref Range   Squamous Epithelial / LPF FEW (A) RARE   WBC, UA 0-2 <3 WBC/hpf   RBC / HPF 0-2 <3 RBC/hpf   Bacteria, UA FEW (A) RARE  Pregnancy, urine POC     Status: None   Collection Time: 12/05/14  3:37 PM  Result Value Ref Range   Preg Test, Ur NEGATIVE NEGATIVE  CBC     Status: None   Collection Time: 12/05/14  5:50 PM  Result Value Ref Range   WBC 9.8 4.0 - 10.5 K/uL   RBC 4.45 3.87 - 5.11 MIL/uL   Hemoglobin 12.4 12.0 - 15.0 g/dL   HCT 16.1 09.6 - 04.5 %   MCV 84.5 78.0 - 100.0 fL   MCH 27.9 26.0 - 34.0 pg   MCHC 33.0 30.0 - 36.0 g/dL   RDW 40.9 81.1 - 91.4 %   Platelets 281 150 - 400 K/uL    MDM UPT negative Pt afebrile, no leukocytosis, no CVA tenderness.  Pt in no apparent distress; laughing & smiling with friend during exam.  Assessment and Plan  A: 1. Urinary tract infection without hematuria, site unspecified   2. Negative pregnancy test    P: Discharge home Rx septra, phenergan, percocet Return to ED for worsening symptoms; including fever or hematuria.   Judeth Horn, NP  12/05/2014, 6:00 PM

## 2014-12-05 NOTE — Discharge Instructions (Signed)

## 2014-12-05 NOTE — MAU Note (Signed)
Left side pain, period started 11/23/14 only lasted 2-3 days, bloating, abd cramps,nausea

## 2014-12-05 NOTE — MAU Note (Addendum)
Pt C/O L side pain - all the way up her ribs, also abd pain all over for the last 1-2 weeks.  Nausea since yesterday.  Last period was very short, only lasted 2 days.  Denies bleeding or discharge.  Negative HPT 3-4 days ago.

## 2014-12-07 LAB — URINE CULTURE: SPECIAL REQUESTS: NORMAL

## 2014-12-15 ENCOUNTER — Encounter (HOSPITAL_COMMUNITY): Payer: Self-pay | Admitting: Nurse Practitioner

## 2014-12-15 ENCOUNTER — Emergency Department (HOSPITAL_COMMUNITY)
Admission: EM | Admit: 2014-12-15 | Discharge: 2014-12-15 | Disposition: A | Discharge status: Home / Self Care | Payer: Self-pay | Attending: Emergency Medicine | Admitting: Emergency Medicine

## 2014-12-15 DIAGNOSIS — Z880 Allergy status to penicillin: Secondary | ICD-10-CM | POA: Insufficient documentation

## 2014-12-15 DIAGNOSIS — Z3202 Encounter for pregnancy test, result negative: Secondary | ICD-10-CM | POA: Insufficient documentation

## 2014-12-15 DIAGNOSIS — N390 Urinary tract infection, site not specified: Secondary | ICD-10-CM | POA: Insufficient documentation

## 2014-12-15 DIAGNOSIS — J45909 Unspecified asthma, uncomplicated: Secondary | ICD-10-CM | POA: Insufficient documentation

## 2014-12-15 LAB — CBC
HCT: 39.4 % (ref 36.0–46.0)
Hemoglobin: 12.9 g/dL (ref 12.0–15.0)
MCH: 27.7 pg (ref 26.0–34.0)
MCHC: 32.7 g/dL (ref 30.0–36.0)
MCV: 84.5 fL (ref 78.0–100.0)
PLATELETS: 295 10*3/uL (ref 150–400)
RBC: 4.66 MIL/uL (ref 3.87–5.11)
RDW: 13.2 % (ref 11.5–15.5)
WBC: 10.5 10*3/uL (ref 4.0–10.5)

## 2014-12-15 LAB — COMPREHENSIVE METABOLIC PANEL
ALK PHOS: 63 U/L (ref 38–126)
ALT: 11 U/L — ABNORMAL LOW (ref 14–54)
AST: 23 U/L (ref 15–41)
Albumin: 4.2 g/dL (ref 3.5–5.0)
Anion gap: 10 (ref 5–15)
BUN: <5 mg/dL — ABNORMAL LOW (ref 6–20)
CALCIUM: 9.9 mg/dL (ref 8.9–10.3)
CO2: 24 mmol/L (ref 22–32)
Chloride: 106 mmol/L (ref 101–111)
Creatinine, Ser: 0.77 mg/dL (ref 0.44–1.00)
Glucose, Bld: 106 mg/dL — ABNORMAL HIGH (ref 65–99)
Potassium: 3.6 mmol/L (ref 3.5–5.1)
Sodium: 140 mmol/L (ref 135–145)
Total Bilirubin: 0.5 mg/dL (ref 0.3–1.2)
Total Protein: 7 g/dL (ref 6.5–8.1)

## 2014-12-15 LAB — URINE MICROSCOPIC-ADD ON

## 2014-12-15 LAB — URINALYSIS, ROUTINE W REFLEX MICROSCOPIC
Bilirubin Urine: NEGATIVE
GLUCOSE, UA: NEGATIVE mg/dL
Hgb urine dipstick: NEGATIVE
KETONES UR: 15 mg/dL — AB
Nitrite: POSITIVE — AB
PROTEIN: 30 mg/dL — AB
Specific Gravity, Urine: 1.029 (ref 1.005–1.030)
Urobilinogen, UA: 1 mg/dL (ref 0.0–1.0)
pH: 8 (ref 5.0–8.0)

## 2014-12-15 LAB — I-STAT BETA HCG BLOOD, ED (MC, WL, AP ONLY): I-stat hCG, quantitative: <5 m[IU]/mL (ref <5)

## 2014-12-15 MED ORDER — NITROFURANTOIN MONOHYD MACRO 100 MG PO CAPS
100.0000 mg | ORAL_CAPSULE | Freq: Once | ORAL | Status: AC
  Administered 2014-12-15: 100 mg via ORAL
  Filled 2014-12-15: qty 1

## 2014-12-15 MED ORDER — NITROFURANTOIN MONOHYD MACRO 100 MG PO CAPS: 100.0000 mg | ORAL_CAPSULE | Freq: Two times a day (BID) | ORAL | Status: DC

## 2014-12-15 NOTE — ED Notes (Signed)
She reports a 2 day history of intermittent episodes of feeling like her heart is racing and feeling like she can not catch her breath. C/o intermittent nausea, abd pain also. Denies dizziness, fevers, cough, bowel/bladder changes. Tried her asthma inhaler with no relief. A&Ox4, resp e/u

## 2014-12-15 NOTE — Discharge Instructions (Signed)
As discussed, your evaluation today has been largely reassuring.  But, it is important that you monitor your condition carefully, and do not hesitate to return to the ED if you develop new, or concerning changes in your condition.  Otherwise, please follow-up with a physician in 3 days to ensure that your condition has improved.

## 2014-12-15 NOTE — ED Notes (Signed)
Pt stable, ambulatory, states understanding of discharge instructions 

## 2014-12-15 NOTE — ED Provider Notes (Signed)
CSN: [id number]     Arrival date & time 12/15/14  1649 History   First MD Initiated Contact with Patient 12/15/14 1809     Chief Complaint  Patient presents with  . Tachycardia     (Consider location/radiation/quality/duration/timing/severity/associated sxs/prior Treatment) HPI Patient presents with multiple concerns. She states that she was generally well until about 5 days ago. Since that time she has had generalized discomfort, palpitations, mild dyspnea, also mild nausea, intermittent crampy abdominal pain. No medication taken for pain relief thus far. No relief with asthma inhaler. Patient has specific concern about being late on her menstrual cycle. Last menstrual cycle was 6 weeks ago. She has no history of prior pregnancy. She denies any vaginal bleeding or discharge currently.  Past Medical History  Diagnosis Date  . Asthma    Past Surgical History  Procedure Laterality Date  . Wisdom tooth extraction     Family History  Problem Relation Age of Onset  . Diabetes Mother   . Hypertension Mother   . Heart failure Mother   . Heart disease Father   . Stroke Father    Social History  Substance Use Topics  . Smoking status: Never Smoker   . Smokeless tobacco: Never Used  . Alcohol Use: No   OB History    Gravida Para Term Preterm AB TAB SAB Ectopic Multiple Living   0 0 0 0 0 0 0 0 0 0      Review of Systems  Constitutional:       Per HPI, otherwise negative  HENT:       Per HPI, otherwise negative  Respiratory:       Per HPI, otherwise negative  Cardiovascular:       Per HPI, otherwise negative  Gastrointestinal: Negative for vomiting.  Endocrine:       Negative aside from HPI  Genitourinary:       Neg aside from HPI   Musculoskeletal:       Per HPI, otherwise negative  Skin: Negative.   Neurological: Negative for syncope.      Allergies  Penicillins  Home Medications   Prior to Admission medications   Medication Sig Start Date End Date  Taking? Authorizing Provider  ibuprofen (ADVIL,MOTRIN) 200 MG tablet Take 200 mg by mouth every 6 (six) hours as needed for headache or mild pain.     Historical Provider, MD  oxyCODONE-acetaminophen (PERCOCET/ROXICET) 5-325 MG tablet Take 2 tablets by mouth every 4 (four) hours as needed for severe pain. 12/05/14   Judeth HornErin Lawrence, NP  promethazine (PHENERGAN) 25 MG tablet Take 0.5 tablets (12.5 mg total) by mouth every 6 (six) hours as needed for nausea or vomiting. 12/05/14   Judeth HornErin Lawrence, NP   BP 100/63 mmHg  Pulse 90  Temp(Src) 98.8 F (37.1 C) (Oral)  Resp 22  Ht 4\' 11"  (1.499 m)  Wt 133 lb 14.4 oz (60.737 kg)  BMI 27.03 kg/m2  SpO2 100%  LMP 11/06/2014 Physical Exam  Constitutional: She is oriented to person, place, and time. She appears well-developed and well-nourished. No distress.  HENT:  Head: Normocephalic and atraumatic.  Eyes: Conjunctivae and EOM are normal.  Cardiovascular: Normal rate and regular rhythm.   Pulmonary/Chest: Effort normal and breath sounds normal. No stridor. No respiratory distress.  Abdominal: She exhibits no distension. There is no tenderness. There is no rebound and no guarding.  Musculoskeletal: She exhibits no edema.  Neurological: She is alert and oriented to person, place, and time. No cranial nerve  deficit.  Skin: Skin is warm and dry.  Psychiatric: She has a normal mood and affect.  Nursing note and vitals reviewed.   ED Course  Procedures (including critical care time) Labs Review Labs Reviewed  COMPREHENSIVE METABOLIC PANEL - Abnormal; Notable for the following:    Glucose, Bld 106 (*)    BUN <5 (*)    ALT 11 (*)    All other components within normal limits  URINALYSIS, ROUTINE W REFLEX MICROSCOPIC (NOT AT [facility]) - Abnormal; Notable for the following:    Color, Urine AMBER (*)    APPearance CLOUDY (*)    Ketones, ur 15 (*)    Protein, ur 30 (*)    Nitrite POSITIVE (*)    Leukocytes, UA SMALL (*)    All other components within  normal limits  URINE MICROSCOPIC-ADD ON - Abnormal; Notable for the following:    Squamous Epithelial / LPF FEW (*)    Bacteria, UA MANY (*)    All other components within normal limits  CBC  I-STAT BETA HCG BLOOD, ED ([facility], [facility], [facility] ONLY)    Imaging Review No results found. I have personally reviewed and evaluated these images and lab results as part of my medical decision-making.   EKG Interpretation   Date/Time:  Saturday December 15 2014 17:08:18 EDT Ventricular Rate:  109 PR Interval:  120 QRS Duration: 64 QT Interval:  316 QTC Calculation: 425 R Axis:   58 Text Interpretation:  Sinus tachycardia Otherwise normal ECG Sinus  tachycardia Abnormal ekg Confirmed by Gerhard Munch  MD [phone]) on  12/15/2014 5:58:40 PM      MDM  Well-appearing female presents with multiple complaints. History the patient is afebrile, hemodynamically stable, awake, alert, with a soft, non-peritoneal abdomen.  Patient's labs are largely reassuring, though there is evidence for a urinary tract infection. Patient started on antibiotics, discharged in stable condition to follow-up with primary care.  Gerhard Munch, MD 12/15/14 [phone]

## 2015-01-04 ENCOUNTER — Ambulatory Visit: Payer: Self-pay | Admitting: Family Medicine

## 2015-05-04 ENCOUNTER — Emergency Department (HOSPITAL_COMMUNITY)
Admission: EM | Admit: 2015-05-04 | Discharge: 2015-05-04 | Disposition: A | Discharge status: Home / Self Care | Payer: Self-pay | Attending: Emergency Medicine | Admitting: Emergency Medicine

## 2015-05-04 ENCOUNTER — Encounter (HOSPITAL_COMMUNITY): Payer: Self-pay

## 2015-05-04 DIAGNOSIS — J069 Acute upper respiratory infection, unspecified: Secondary | ICD-10-CM | POA: Insufficient documentation

## 2015-05-04 DIAGNOSIS — Z79899 Other long term (current) drug therapy: Secondary | ICD-10-CM | POA: Insufficient documentation

## 2015-05-04 DIAGNOSIS — J45909 Unspecified asthma, uncomplicated: Secondary | ICD-10-CM | POA: Insufficient documentation

## 2015-05-04 DIAGNOSIS — M791 Myalgia: Secondary | ICD-10-CM | POA: Insufficient documentation

## 2015-05-04 DIAGNOSIS — Z880 Allergy status to penicillin: Secondary | ICD-10-CM | POA: Insufficient documentation

## 2015-05-04 LAB — RAPID STREP SCREEN (MED CTR MEBANE ONLY): STREPTOCOCCUS, GROUP A SCREEN (DIRECT): NEGATIVE

## 2015-05-04 MED ORDER — CETIRIZINE HCL 10 MG PO TABS: 10.0000 mg | ORAL_TABLET | Freq: Every day | ORAL | Status: DC

## 2015-05-04 MED ORDER — IBUPROFEN 800 MG PO TABS: 800.0000 mg | ORAL_TABLET | Freq: Three times a day (TID) | ORAL | Status: DC

## 2015-05-04 MED ORDER — FLUTICASONE PROPIONATE 50 MCG/ACT NA SUSP: 2.0000 | Freq: Every day | NASAL | Status: DC

## 2015-05-04 MED ORDER — CEPHALEXIN 500 MG PO CAPS: 500.0000 mg | ORAL_CAPSULE | Freq: Three times a day (TID) | ORAL | Status: DC

## 2015-05-04 MED ORDER — ACETAMINOPHEN 325 MG PO TABS: 650.0000 mg | ORAL_TABLET | Freq: Four times a day (QID) | ORAL | Status: DC | PRN

## 2015-05-04 MED ORDER — KETOROLAC TROMETHAMINE 30 MG/ML IJ SOLN
30.0000 mg | Freq: Once | INTRAMUSCULAR | Status: AC
  Administered 2015-05-04: 30 mg via INTRAMUSCULAR
  Filled 2015-05-04: qty 1

## 2015-05-04 NOTE — ED Provider Notes (Signed)
CSN: [id number]     Arrival date & time 05/04/15  1200 History  By signing my name below, I, Soijett Blue, attest that this documentation has been prepared under the direction and in the presence of Kaysea Raya Y. Wakeelah Solan, PA-C Electronically Signed: Soijett Blue, ED Scribe. 05/04/2015. 12:59 PM.    Chief Complaint  Patient presents with  . Cough      The history is provided by the patient. No language interpreter was used.    Janice White is a 25 y.o. female with a PMHx of asthma, who presents to the Emergency Department complaining of dry cough onset 4 days worsening yesterday. Pt voices that she thinks that her symptoms may be due to strep throat which she has every year. She states that she is having associated symptoms of sore throat, nasal congestion, generalized body aches, rhinorrhea, and HA. She states that she has tried vitamin C, home remedies, nyquil for the relief for her symptoms. She denies n/v, fever, and any other symptoms.   Past Medical History  Diagnosis Date  . Asthma    Past Surgical History  Procedure Laterality Date  . Wisdom tooth extraction     Family History  Problem Relation Age of Onset  . Diabetes Mother   . Hypertension Mother   . Heart failure Mother   . Heart disease Father   . Stroke Father    Social History  Substance Use Topics  . Smoking status: Never Smoker   . Smokeless tobacco: Never Used  . Alcohol Use: No   OB History    Gravida Para Term Preterm AB TAB SAB Ectopic Multiple Living   0 0 0 0 0 0 0 0 0 0      Review of Systems  Constitutional: Negative for fever.  HENT: Positive for congestion, rhinorrhea and sore throat.   Respiratory: Positive for cough.   Gastrointestinal: Negative for nausea and vomiting.  Musculoskeletal: Positive for myalgias.  All other systems reviewed and are negative.     Allergies  Penicillins  Home Medications   Prior to Admission medications   Medication Sig Start Date End Date Taking?  Authorizing Provider  ibuprofen (ADVIL,MOTRIN) 200 MG tablet Take 200 mg by mouth every 6 (six) hours as needed for headache or mild pain.     Historical Provider, MD  nitrofurantoin, macrocrystal-monohydrate, (MACROBID) 100 MG capsule Take 1 capsule (100 mg total) by mouth 2 (two) times daily. 12/15/14   Gerhard Munchobert Lockwood, MD  oxyCODONE-acetaminophen (PERCOCET/ROXICET) 5-325 MG tablet Take 2 tablets by mouth every 4 (four) hours as needed for severe pain. 12/05/14   Judeth HornErin Lawrence, NP  promethazine (PHENERGAN) 25 MG tablet Take 0.5 tablets (12.5 mg total) by mouth every 6 (six) hours as needed for nausea or vomiting. 12/05/14   Judeth HornErin Lawrence, NP   BP 108/67 mmHg  Pulse 95  Temp(Src) 99 F (37.2 C) (Oral)  Resp 16  SpO2 99%  LMP 04/08/2015 Physical Exam  Constitutional: She is oriented to person, place, and time. She appears well-developed and well-nourished. No distress.  HENT:  Head: Normocephalic and atraumatic.  Mouth/Throat: Uvula is midline and mucous membranes are normal. Posterior oropharyngeal erythema present. No posterior oropharyngeal edema.  Nasal congestion.  Eyes: EOM are normal.  Neck: Neck supple.  Cardiovascular: Normal rate, regular rhythm and normal heart sounds.  Exam reveals no gallop and no friction rub.   No murmur heard. Pulmonary/Chest: Effort normal and breath sounds normal. No respiratory distress. She has no wheezes. She has  no rales.  Musculoskeletal: Normal range of motion.  Neurological: She is alert and oriented to person, place, and time.  Skin: Skin is warm and dry.  Psychiatric: She has a normal mood and affect. Her behavior is normal.  Nursing note and vitals reviewed.  Filed Vitals:   05/04/15 1213 05/04/15 1511  BP: 108/67 106/65  Pulse: 95 86  Temp: 99 F (37.2 C) 98.8 F (37.1 C)  TempSrc: Oral Oral  Resp: 16 16  SpO2: 99% 99%     ED Course  Procedures (including critical care time) DIAGNOSTIC STUDIES: Oxygen Saturation is 99% on RA,  nl by my interpretation.    COORDINATION OF CARE: 12:57 PM Discussed treatment plan with pt at bedside which includes rapid strep screen and culture and pt agreed to plan.    Labs Review Labs Reviewed  RAPID STREP SCREEN (NOT AT [facility])  CULTURE, GROUP A STREP [facility])    Imaging Review No results found. I have personally reviewed and evaluated these lab results as part of my medical decision-making.   EKG Interpretation None      MDM   Final diagnoses:  URI (upper respiratory infection)    Pt requesting rapid strep.   Rapid strep negative. Suspect viral URI. Rx given for supportive meds including zyrtec, flonase, and ibuprofen. INstructed to f/u with PCP. ER return precautions given.  I personally performed the services described in this documentation, which was scribed in my presence. The recorded information has been reviewed and is accurate.   Carlene Coria, PA-C 05/05/15 1432  Donnetta Hutching, MD 05/05/15 [phone]

## 2015-05-04 NOTE — ED Notes (Signed)
Declined W/C at D/C and was escorted to lobby by RN. 

## 2015-05-04 NOTE — ED Notes (Signed)
Pt here with c/o productive cough of clear phlegm, congestion, sore throat and body aches; symptoms onset two days ago. Denies N/V/D.

## 2015-05-04 NOTE — Discharge Instructions (Signed)
You were seen in the ED for evaluation of cold symptoms. You likely have a virus. I will give you several prescriptions to help with your symptoms.  Take medications as prescribed. Return to the emergency room for worsening condition or new concerning symptoms. Follow up with your regular doctor. If you don't have a regular doctor use one of the numbers below to establish a primary care doctor.   Emergency Department Resource Guide 1) Find a Doctor and Pay Out of Pocket Although you won't have to find out who is covered by your insurance plan, it is a good idea to ask around and get recommendations. You will then need to call the office and see if the doctor you have chosen will accept you as a new patient and what types of options they offer for patients who are self-pay. Some doctors offer discounts or will set up payment plans for their patients who do not have insurance, but you will need to ask so you aren't surprised when you get to your appointment.  2) Contact Your Local Health Department Not all health departments have doctors that can see patients for sick visits, but many do, so it is worth a call to see if yours does. If you don't know where your local health department is, you can check in your phone book. The CDC also has a tool to help you locate your state's health department, and many state websites also have listings of all of their local health departments.  3) Find a Walk-in Clinic If your illness is not likely to be very severe or complicated, you may want to try a walk in clinic. These are popping up all over the country in pharmacies, drugstores, and shopping centers. They're usually staffed by nurse practitioners or physician assistants that have been trained to treat common illnesses and complaints. They're usually fairly quick and inexpensive. However, if you have serious medical issues or chronic medical problems, these are probably not your best option.  No Primary Care  Doctor: - Call Health Connect at  [phone] - they can help you locate a primary care doctor that  accepts your insurance, provides certain services, etc. - Physician Referral [phone]  Emergency Department Resource Guide 1) Find a Doctor and Pay Out of Pocket Although you won't have to find out who is covered by your insurance plan, it is a good idea to ask around and get recommendations. You will then need to call the office and see if the doctor you have chosen will accept you as a new patient and what types of options they offer for patients who are self-pay. Some doctors offer discounts or will set up payment plans for their patients who do not have insurance, but you will need to ask so you aren't surprised when you get to your appointment.  2) Contact Your Local Health Department Not all health departments have doctors that can see patients for sick visits, but many do, so it is worth a call to see if yours does. If you don't know where your local health department is, you can check in your phone book. The CDC also has a tool to help you locate your state's health department, and many state websites also have listings of all of their local health departments.  3) Find a Walk-in Clinic If your illness is not likely to be very severe or complicated, you may want to try a walk in clinic. These are popping up all over the country  in pharmacies, drugstores, and shopping centers. They're usually staffed by nurse practitioners or physician assistants that have been trained to treat common illnesses and complaints. They're usually fairly quick and inexpensive. However, if you have serious medical issues or chronic medical problems, these are probably not your best option.  No Primary Care Doctor: - Call Health Connect at  [phone] - they can help you locate a primary care doctor that  accepts your insurance, provides certain services, etc. - Physician Referral Service-  [phone]  Chronic Pain Problems: Organization         Address  Phone   Notes  [facility]  [phone]) [phone] Patients need to be referred by their primary care doctor.   Medication Assistance: Organization         Address  Phone   Notes  [facility] Medication [facility] [address] [place], [place] [address] [phone] --Must be a resident of [place] -- Must have NO insurance coverage whatsoever (no Medicaid/ Medicare, etc.) -- The pt. MUST have a primary care doctor that directs their care regularly and follows them in the community   MedAssist  [phone]   [employer]  [phone]    Agencies that provide inexpensive medical care: Organization         Address  Phone   Notes  [facility]  [phone]   [facility]    [phone]) [phone]   [facility] [address] [place], [place] [address] [phone]   [facility] [address], [place] [phone]) [phone]   [facility]    [phone]   [facility]    [phone]   [facility]  [address], [place] Phone:  [phone](336) [phone], Fax:  [phone]) [phone] Hours of Operation:  9 am - 6 pm, M-F.  Also accepts Medicaid/Medicare and self-pay.  [facility]  [address], [place] Phone: [phone], Fax: [phone]. Hours of Operation:  8:30 am - 5:30 pm, M-F.  Also accepts Medicaid and self-pay.  [facility] [place] [address], [place] Phone: [phone]) [phone]   [facility] [address], [place], [place] [phone], Ext. [phone] Mondays & Thursdays: 7-9 AM.  First 15 patients are seen on a first come, first serve basis.    Medicaid-accepting [facility] Providers:  Organization         Address  Phone   Notes  [facility] [address],  [place] [phone]) [phone] Also accepts self-pay patients.  [facility] [address], [place]  [phone]   [facility] [address], [place] [phone]   [facility] [address] [place] [address], [place] [phone]) [phone]   Renaye RakersVeita Bland [address], [place]   [phone]) [phone] Only accepts [place] patients after they have their name applied to their card.   Self-Pay (no insurance) in [facility]:  Organization         Address  Phone   Notes  Sickle Cell Patients, [facility] [address], [place] [phone]   [facility] [address], [place] [phone]   [facility] [place]  [address],  [phone]   [facility]/Dr. Osei-Bonsu  [address] [place] [address], [place] or [address], [place] [phone]) [phone] Phone number for both [place] and [place] locations is the same.  [facility] [address], [place] [phone]   [facility] [place] [address] [place] [address], [place] or [address] [phone]) [phone]   [facility] [address], [place] [phone], phone; [phone]) [phone], fax Sees patients 1st and 3rd Saturday of every month.  Must not qualify for public or private insurance (i.e. Medicaid, Medicare, [employer] Choice, Veterans' Benefits)  Household income should be no more than 200% of the poverty level The clinic cannot treat you if you are pregnant or think you are pregnant  Sexually transmitted diseases are not treated at the clinic.

## 2015-05-06 LAB — CULTURE, GROUP A STREP (THRC)

## 2015-08-01 ENCOUNTER — Emergency Department (HOSPITAL_COMMUNITY)
Admission: EM | Admit: 2015-08-01 | Discharge: 2015-08-01 | Disposition: A | Discharge status: Home / Self Care | Payer: Self-pay | Attending: Emergency Medicine | Admitting: Emergency Medicine

## 2015-08-01 ENCOUNTER — Encounter (HOSPITAL_COMMUNITY): Payer: Self-pay | Admitting: *Deleted

## 2015-08-01 DIAGNOSIS — J45909 Unspecified asthma, uncomplicated: Secondary | ICD-10-CM | POA: Insufficient documentation

## 2015-08-01 DIAGNOSIS — Z79891 Long term (current) use of opiate analgesic: Secondary | ICD-10-CM | POA: Insufficient documentation

## 2015-08-01 DIAGNOSIS — Z79899 Other long term (current) drug therapy: Secondary | ICD-10-CM | POA: Insufficient documentation

## 2015-08-01 DIAGNOSIS — J00 Acute nasopharyngitis [common cold]: Secondary | ICD-10-CM | POA: Insufficient documentation

## 2015-08-01 MED ORDER — CETIRIZINE HCL 10 MG PO TABS: 10.0000 mg | ORAL_TABLET | Freq: Every day | ORAL | Status: DC

## 2015-08-01 MED ORDER — SALINE SPRAY 0.65 % NA SOLN: 1.0000 | NASAL | Status: DC | PRN

## 2015-08-01 NOTE — ED Notes (Signed)
The pt reports that she has had the sniffles since yesterday body aches sorethroat  lmp may 20th

## 2015-08-01 NOTE — ED Provider Notes (Signed)
CSN: [id number]     Arrival date & time 08/01/15  7829 History   First MD Initiated Contact with Patient 08/01/15 [phone]     Chief Complaint  Patient presents with  . she has a cold      (Consider location/radiation/quality/duration/timing/severity/associated sxs/prior Treatment) HPI Comments: Patient presents to the ED with a chief complaint of rhinorrhea and sore throat.  Onset last night.  Associated with generalized body aches.  Denies fever, cough, n/v/d.  Has not taken anything to alleviate her symptoms.  No modifying factors.  The history is provided by the patient. No language interpreter was used.    Past Medical History  Diagnosis Date  . Asthma    Past Surgical History  Procedure Laterality Date  . Wisdom tooth extraction     Family History  Problem Relation Age of Onset  . Diabetes Mother   . Hypertension Mother   . Heart failure Mother   . Heart disease Father   . Stroke Father    Social History  Substance Use Topics  . Smoking status: Never Smoker   . Smokeless tobacco: Never Used  . Alcohol Use: No   OB History    Gravida Para Term Preterm AB TAB SAB Ectopic Multiple Living       Review of Systems  Constitutional: Negative for fever and chills.  HENT: Positive for postnasal drip, rhinorrhea, sinus pressure, sneezing and sore throat.   Respiratory: Negative for cough and shortness of breath.   Cardiovascular: Negative for chest pain.  Gastrointestinal: Negative for nausea, vomiting, abdominal pain, diarrhea and constipation.  Genitourinary: Negative for dysuria.      Allergies  Penicillins  Home Medications   Prior to Admission medications   Medication Sig Start Date End Date Taking? Authorizing Provider  cetirizine (ZYRTEC) 10 MG tablet Take 1 tablet (10 mg total) by mouth daily. 08/01/15   Roxy Horseman, PA-C  nitrofurantoin, macrocrystal-monohydrate, (MACROBID) 100 MG capsule Take 1 capsule (100 mg total) by mouth 2  (two) times daily. 12/15/14   Gerhard Munch, MD  oxyCODONE-acetaminophen (PERCOCET/ROXICET) 5-325 MG tablet Take 2 tablets by mouth every 4 (four) hours as needed for severe pain. 12/05/14   Judeth Horn, NP  promethazine (PHENERGAN) 25 MG tablet Take 0.5 tablets (12.5 mg total) by mouth every 6 (six) hours as needed for nausea or vomiting. 12/05/14   Judeth Horn, NP  sodium chloride (OCEAN) 0.65 % SOLN nasal spray Place 1 spray into both nostrils as needed for congestion. 08/01/15   Roxy Horseman, PA-C   BP 115/73 mmHg  Pulse 88  Temp(Src) 99 F (37.2 C) (Oral)  Resp 18  Ht 5' (1.524 m)  SpO2 98%  LMP 07/06/2015 Physical Exam  Constitutional: She appears well-developed and well-nourished. No distress.  HENT:  Head: Normocephalic.  Right Ear: External ear normal.  Left Ear: External ear normal.  Mildly erythematous, no tonsillar exudate, no abscess, no stridor, uvula is midline  TMs clear bilaterally  Eyes: Conjunctivae and EOM are normal. Pupils are equal, round, and reactive to light.  Neck: Normal range of motion. Neck supple.  Cardiovascular: Normal rate, regular rhythm and normal heart sounds.  Exam reveals no gallop and no friction rub.   No murmur heard. Pulmonary/Chest: Effort normal and breath sounds normal. No stridor. No respiratory distress. She has no wheezes. She has no rales. She exhibits no tenderness.  CTAB  Abdominal: Soft. Bowel sounds are normal. She exhibits no  distension. There is no tenderness.  Musculoskeletal: Normal range of motion. She exhibits no tenderness.  Neurological: She is alert.  Skin: Skin is warm and dry. No rash noted. She is not diaphoretic.  Psychiatric: She has a normal mood and affect. Her behavior is normal. Judgment and thought content normal.  Nursing note and vitals reviewed.   ED Course  Procedures (including critical care time)   MDM   Final diagnoses:  Common cold    Patient with colds symptoms of sore throat and  rhinorrhea.  No fever, no exudate, no cervical adenopathy, doubt strep.  Recommend conservative treatment.  PCP follow-up.    Roxy Horsemanobert Rocio Roam, PA-C 08/01/15 [id number]  Zadie Rhineonald Wickline, MD 08/01/15 [phone]

## 2015-08-01 NOTE — Discharge Instructions (Signed)
Upper Respiratory Infection, Adult Most upper respiratory infections (URIs) are a viral infection of the air passages leading to the lungs. A URI affects the nose, throat, and upper air passages. The most common type of URI is nasopharyngitis and is typically referred to as "the common cold." URIs run their course and usually go away on their own. Most of the time, a URI does not require medical attention, but sometimes a bacterial infection in the upper airways can follow a viral infection. This is called a secondary infection. Sinus and middle ear infections are common types of secondary upper respiratory infections. Bacterial pneumonia can also complicate a URI. A URI can worsen asthma and chronic obstructive pulmonary disease (COPD). Sometimes, these complications can require emergency medical care and may be life threatening.  CAUSES Almost all URIs are caused by viruses. A virus is a type of germ and can spread from one person to another.  RISKS FACTORS You may be at risk for a URI if:   You smoke.   You have chronic heart or lung disease.  You have a weakened defense (immune) system.   You are very young or very old.   You have nasal allergies or asthma.  You work in crowded or poorly ventilated areas.  You work in health care facilities or schools. SIGNS AND SYMPTOMS  Symptoms typically develop 2-3 days after you come in contact with a cold virus. Most viral URIs last 7-10 days. However, viral URIs from the influenza virus (flu virus) can last 14-18 days and are typically more severe. Symptoms may include:   Runny or stuffy (congested) nose.   Sneezing.   Cough.   Sore throat.   Headache.   Fatigue.   Fever.   Loss of appetite.   Pain in your forehead, behind your eyes, and over your cheekbones (sinus pain).  Muscle aches.  DIAGNOSIS  Your health care provider may diagnose a URI by:  Physical exam.  Tests to check that your symptoms are not due to  another condition such as:  Strep throat.  Sinusitis.  Pneumonia.  Asthma. TREATMENT  A URI goes away on its own with time. It cannot be cured with medicines, but medicines may be prescribed or recommended to relieve symptoms. Medicines may help:  Reduce your fever.  Reduce your cough.  Relieve nasal congestion. HOME CARE INSTRUCTIONS   Take medicines only as directed by your health care provider.   Gargle warm saltwater or take cough drops to comfort your throat as directed by your health care provider.  Use a warm mist humidifier or inhale steam from a shower to increase air moisture. This may make it easier to breathe.  Drink enough fluid to keep your urine clear or pale yellow.   Eat soups and other clear broths and maintain good nutrition.   Rest as needed.   Return to work when your temperature has returned to normal or as your health care provider advises. You may need to stay home longer to avoid infecting others. You can also use a face mask and careful hand washing to prevent spread of the virus.  Increase the usage of your inhaler if you have asthma.   Do not use any tobacco products, including cigarettes, chewing tobacco, or electronic cigarettes. If you need help quitting, ask your health care provider. PREVENTION  The best way to protect yourself from getting a cold is to practice good hygiene.   Avoid oral or hand contact with people with cold   symptoms.   Wash your hands often if contact occurs.  There is no clear evidence that vitamin C, vitamin E, echinacea, or exercise reduces the chance of developing a cold. However, it is always recommended to get plenty of rest, exercise, and practice good nutrition.  SEEK MEDICAL CARE IF:   You are getting worse rather than better.   Your symptoms are not controlled by medicine.   You have chills.  You have worsening shortness of breath.  You have brown or red mucus.  You have yellow or brown nasal  discharge.  You have pain in your face, especially when you bend forward.  You have a fever.  You have swollen neck glands.  You have pain while swallowing.  You have white areas in the back of your throat. SEEK IMMEDIATE MEDICAL CARE IF:   You have severe or persistent:  Headache.  Ear pain.  Sinus pain.  Chest pain.  You have chronic lung disease and any of the following:  Wheezing.  Prolonged cough.  Coughing up blood.  A change in your usual mucus.  You have a stiff neck.  You have changes in your:  Vision.  Hearing.  Thinking.  Mood. MAKE SURE YOU:   Understand these instructions.  Will watch your condition.  Will get help right away if you are not doing well or get worse.   This information is not intended to replace advice given to you by your health care provider. Make sure you discuss any questions you have with your health care provider.   Document Released: 07/29/2000 Document Revised: 06/19/2014 Document Reviewed: 05/10/2013 Elsevier Interactive Patient Education 2016 Elsevier Inc.  

## 2015-08-30 ENCOUNTER — Encounter (HOSPITAL_COMMUNITY): Payer: Self-pay | Admitting: *Deleted

## 2015-08-30 ENCOUNTER — Emergency Department (HOSPITAL_COMMUNITY)
Admission: EM | Admit: 2015-08-30 | Discharge: 2015-08-30 | Disposition: A | Discharge status: Home / Self Care | Payer: Self-pay | Attending: Emergency Medicine | Admitting: Emergency Medicine

## 2015-08-30 DIAGNOSIS — N390 Urinary tract infection, site not specified: Secondary | ICD-10-CM | POA: Insufficient documentation

## 2015-08-30 DIAGNOSIS — Z79899 Other long term (current) drug therapy: Secondary | ICD-10-CM | POA: Insufficient documentation

## 2015-08-30 DIAGNOSIS — N72 Inflammatory disease of cervix uteri: Secondary | ICD-10-CM | POA: Insufficient documentation

## 2015-08-30 DIAGNOSIS — J45909 Unspecified asthma, uncomplicated: Secondary | ICD-10-CM | POA: Insufficient documentation

## 2015-08-30 LAB — URINALYSIS, ROUTINE W REFLEX MICROSCOPIC
Bilirubin Urine: NEGATIVE
GLUCOSE, UA: NEGATIVE mg/dL
HGB URINE DIPSTICK: NEGATIVE
KETONES UR: NEGATIVE mg/dL
Nitrite: NEGATIVE
PROTEIN: NEGATIVE mg/dL
Specific Gravity, Urine: 1.017 (ref 1.005–1.030)
pH: 6.5 (ref 5.0–8.0)

## 2015-08-30 LAB — POC URINE PREG, ED: Preg Test, Ur: NEGATIVE

## 2015-08-30 LAB — URINE MICROSCOPIC-ADD ON

## 2015-08-30 LAB — RAPID HIV SCREEN (HIV 1/2 AB+AG)
HIV 1/2 ANTIBODIES: NONREACTIVE
HIV-1 P24 Antigen - HIV24: NONREACTIVE

## 2015-08-30 LAB — WET PREP, GENITAL
Sperm: NONE SEEN
Trich, Wet Prep: NONE SEEN
YEAST WET PREP: NONE SEEN

## 2015-08-30 MED ORDER — AZITHROMYCIN 1 G PO PACK
1.0000 g | PACK | Freq: Once | ORAL | Status: AC
  Administered 2015-08-30: 1 g via ORAL
  Filled 2015-08-30: qty 1

## 2015-08-30 MED ORDER — NITROFURANTOIN MONOHYD MACRO 100 MG PO CAPS: 100.0000 mg | ORAL_CAPSULE | Freq: Two times a day (BID) | ORAL | Status: DC

## 2015-08-30 MED ORDER — CEFTRIAXONE SODIUM 250 MG IJ SOLR
250.0000 mg | Freq: Once | INTRAMUSCULAR | Status: AC
  Administered 2015-08-30: 250 mg via INTRAMUSCULAR
  Filled 2015-08-30: qty 250

## 2015-08-30 NOTE — ED Provider Notes (Signed)
CSN: [id number]     Arrival date & time 08/30/15  1547 History   First MD Initiated Contact with Patient 08/30/15 2041     Chief Complaint  Patient presents with  . Exposure to STD     (Consider location/radiation/quality/duration/timing/severity/associated sxs/prior Treatment) HPI Janice White is a 25 y.o. female with history of asthma, presents to emergency department complaining of vaginal discomfort. Patient states that she is worried she may have an STD. She reports unprotected intercourse with one female. She states "it just feels itchy and uncomfortable in my private area." She denies any vaginal discharge, no vaginal bleeding, no pain with intercourse. She denies any abdominal pain. No nausea vomiting. No fever. No urinary symptoms. Unsure if pregnant.   Past Medical History  Diagnosis Date  . Asthma    Past Surgical History  Procedure Laterality Date  . Wisdom tooth extraction     Family History  Problem Relation Age of Onset  . Diabetes Mother   . Hypertension Mother   . Heart failure Mother   . Heart disease Father   . Stroke Father    Social History  Substance Use Topics  . Smoking status: Never Smoker   . Smokeless tobacco: Never Used  . Alcohol Use: No   OB History    Gravida Para Term Preterm AB TAB SAB Ectopic Multiple Living   0 0 0 0 0 0 0 0 0 0      Review of Systems  Constitutional: Negative for fever and chills.  Respiratory: Negative for cough, chest tightness and shortness of breath.   Cardiovascular: Negative for chest pain, palpitations and leg swelling.  Gastrointestinal: Negative for nausea, vomiting, abdominal pain and diarrhea.  Genitourinary: Positive for vaginal pain. Negative for dysuria, flank pain, vaginal bleeding, vaginal discharge and pelvic pain.  Musculoskeletal: Negative for myalgias, arthralgias, neck pain and neck stiffness.  Skin: Negative for rash.  Neurological: Negative for dizziness, weakness and headaches.  All other  systems reviewed and are negative.     Allergies  Penicillins  Home Medications   Prior to Admission medications   Medication Sig Start Date End Date Taking? Authorizing Provider  cetirizine (ZYRTEC) 10 MG tablet Take 1 tablet (10 mg total) by mouth daily. 08/01/15   [name], PA-C  nitrofurantoin, macrocrystal-monohydrate, (MACROBID) 100 MG capsule Take 1 capsule (100 mg total) by mouth 2 (two) times daily. 12/15/14   Gerhard Munchobert Lockwood, MD  oxyCODONE-acetaminophen (PERCOCET/ROXICET) 5-325 MG tablet Take 2 tablets by mouth every 4 (four) hours as needed for severe pain. 12/05/14   Judeth HornErin Lawrence, NP  promethazine (PHENERGAN) 25 MG tablet Take 0.5 tablets (12.5 mg total) by mouth every 6 (six) hours as needed for nausea or vomiting. 12/05/14   Judeth HornErin Lawrence, NP  sodium chloride (OCEAN) 0.65 % SOLN nasal spray Place 1 spray into both nostrils as needed for congestion. 08/01/15   [name], PA-C   BP 119/78 mmHg  Pulse 85  Temp(Src) 97.7 F (36.5 C) (Oral)  Resp 18  Wt 54.205 kg  LMP 08/05/2015 Physical Exam  Constitutional: She appears well-developed and well-nourished. No distress.  HENT:  Head: Normocephalic.  Eyes: Conjunctivae are normal.  Neck: Neck supple.  Cardiovascular: Normal rate, regular rhythm and normal heart sounds.   Pulmonary/Chest: Effort normal and breath sounds normal. No respiratory distress. She has no wheezes. She has no rales.  Abdominal: Soft. Bowel sounds are normal. She exhibits no distension. There is no tenderness. There is no rebound.  Genitourinary:  Normal  external genitalia. Normal vaginal canal. moderate creamy white discharge. Cervix is normal, closed. mild CMT. No uterine or adnexal tenderness. No masses palpated.    Musculoskeletal: She exhibits no edema.  Neurological: She is alert.  Skin: Skin is warm and dry.  Psychiatric: She has a normal mood and affect. Her behavior is normal.  Nursing note and vitals reviewed.   ED Course   Procedures (including critical care time) Labs Review Labs Reviewed  WET PREP, GENITAL  RPR  RAPID HIV SCREEN (HIV 1/2 AB+AG)  GC/CHLAMYDIA PROBE AMP ([place]) NOT AT [facility]    Imaging Review No results found. I have personally reviewed and evaluated these images and lab results as part of my medical decision-making.   EKG Interpretation None      MDM   Final diagnoses:  Cervicitis  UTI (lower urinary tract infection)    Patient emergency department complaining of vaginal discomfort. She is concerned about STD. Pelvic exam did show moderate discharge with mild cervical motion tenderness. Wet prep showed many white blood cells. Will cover for gonorrhea and chlamydia with 250 mg of Rocephin IM and 1 g of Zithromax. Urinalysis is contaminated with 6-30 epithelial cells, however does show too numerous to count white blood cells and many bacteria, I'll put her on Macrobid. HIV is negative. RPR and GC chlamydia are pending. Patient will be discharged home. Safe sex practices discussed. We'll have her follow up as needed. No fever. No evidence of PID. Urine preg test is negative.   Filed Vitals:   08/30/15 1555  BP: 119/78  Pulse: 85  Temp: 97.7 F (36.5 C)  TempSrc: Oral  Resp: 18  Weight: 54.205 kg       Jaynie Crumble, PA-C 08/30/15 2238  Leta Baptist, MD 09/09/15 [phone]

## 2015-08-30 NOTE — ED Notes (Signed)
Pt thinks she has been exposed to chlamydia and was positive for that, but not treated.  No vaginal discharge.  LMP-June 19.  Also concerned she was exposed to hepatitis

## 2015-08-30 NOTE — Discharge Instructions (Signed)
Take macrobid as prescribed until all gone for UTI. You were treated today for possible gonorrhea and chlamydia infection. These tests are still pending and you will be called if they return positive. No intercourse for 1 week. Follow up as needed.   Cervicitis Cervicitis is a soreness and swelling (inflammation) of the cervix. Your cervix is located at the bottom of your uterus. It opens up to the vagina. CAUSES   Sexually transmitted infections (STIs).   Allergic reaction.   Medicines or birth control devices that are put in the vagina.   Injury to the cervix.   Bacterial infections.  RISK FACTORS You are at greater risk if you:  Have unprotected sexual intercourse.  Have sexual intercourse with many partners.  Began sexual intercourse at an early age.  Have a history of STIs. SYMPTOMS  There may be no symptoms. If symptoms occur, they may include:   Gray, white, yellow, or bad-smelling vaginal discharge.   Pain or itching of the area outside the vagina.   Painful sexual intercourse.   Lower abdominal or lower back pain, especially during intercourse.   Frequent urination.   Abnormal vaginal bleeding between periods, after sexual intercourse, or after menopause.   Pressure or a heavy feeling in the pelvis.  DIAGNOSIS  Diagnosis is made after a pelvic exam. Other tests may include:   Examination of any discharge under a microscope (wet prep).   A Pap test.  TREATMENT  Treatment will depend on the cause of cervicitis. If it is caused by an STI, both you and your partner will need to be treated. Antibiotic medicines will be given.  HOME CARE INSTRUCTIONS   Do not have sexual intercourse until your health care provider says it is okay.   Do not have sexual intercourse until your partner has been treated, if your cervicitis is caused by an STI.   Take your antibiotics as directed. Finish them even if you start to feel better.  SEEK MEDICAL CARE  IF:  Your symptoms come back.   You have a fever.  MAKE SURE YOU:   Understand these instructions.  Will watch your condition.  Will get help right away if you are not doing well or get worse.   This information is not intended to replace advice given to you by your health care provider. Make sure you discuss any questions you have with your health care provider.   Document Released: 02/02/2005 Document Revised: 02/07/2013 Document Reviewed: 07/27/2012 Elsevier Interactive Patient Education 2016 Elsevier Inc.  Urinary Tract Infection Urinary tract infections (UTIs) can develop anywhere along your urinary tract. Your urinary tract is your body's drainage system for removing wastes and extra water. Your urinary tract includes two kidneys, two ureters, a bladder, and a urethra. Your kidneys are a pair of bean-shaped organs. Each kidney is about the size of your fist. They are located below your ribs, one on each side of your spine. CAUSES Infections are caused by microbes, which are microscopic organisms, including fungi, viruses, and bacteria. These organisms are so small that they can only be seen through a microscope. Bacteria are the microbes that most commonly cause UTIs. SYMPTOMS  Symptoms of UTIs may vary by age and gender of the patient and by the location of the infection. Symptoms in young women typically include a frequent and intense urge to urinate and a painful, burning feeling in the bladder or urethra during urination. Older women and men are more likely to be tired, shaky, and  weak and have muscle aches and abdominal pain. A fever may mean the infection is in your kidneys. Other symptoms of a kidney infection include pain in your back or sides below the ribs, nausea, and vomiting. DIAGNOSIS To diagnose a UTI, your caregiver will ask you about your symptoms. Your caregiver will also ask you to provide a urine sample. The urine sample will be tested for bacteria and white  blood cells. White blood cells are made by your body to help fight infection. TREATMENT  Typically, UTIs can be treated with medication. Because most UTIs are caused by a bacterial infection, they usually can be treated with the use of antibiotics. The choice of antibiotic and length of treatment depend on your symptoms and the type of bacteria causing your infection. HOME CARE INSTRUCTIONS  If you were prescribed antibiotics, take them exactly as your caregiver instructs you. Finish the medication even if you feel better after you have only taken some of the medication.  Drink enough water and fluids to keep your urine clear or pale yellow.  Avoid caffeine, tea, and carbonated beverages. They tend to irritate your bladder.  Empty your bladder often. Avoid holding urine for long periods of time.  Empty your bladder before and after sexual intercourse.  After a bowel movement, women should cleanse from front to back. Use each tissue only once. SEEK MEDICAL CARE IF:   You have back pain.  You develop a fever.  Your symptoms do not begin to resolve within 3 days. SEEK IMMEDIATE MEDICAL CARE IF:   You have severe back pain or lower abdominal pain.  You develop chills.  You have nausea or vomiting.  You have continued burning or discomfort with urination. MAKE SURE YOU:   Understand these instructions.  Will watch your condition.  Will get help right away if you are not doing well or get worse.   This information is not intended to replace advice given to you by your health care provider. Make sure you discuss any questions you have with your health care provider.   Document Released: 11/12/2004 Document Revised: 10/24/2014 Document Reviewed: 03/13/2011 Elsevier Interactive Patient Education [employer] Elsevier Inc.

## 2015-09-01 LAB — RPR, QUANT+TP ABS (REFLEX): TREPONEMA PALLIDUM AB: POSITIVE — AB

## 2015-09-01 LAB — RPR: RPR: REACTIVE — AB

## 2015-09-02 ENCOUNTER — Telehealth (HOSPITAL_BASED_OUTPATIENT_CLINIC_OR_DEPARTMENT_OTHER): Payer: Self-pay

## 2015-09-02 LAB — GC/CHLAMYDIA PROBE AMP (~~LOC~~) NOT AT ARMC
CHLAMYDIA, DNA PROBE: POSITIVE — AB
NEISSERIA GONORRHEA: NEGATIVE

## 2015-09-03 ENCOUNTER — Telehealth (HOSPITAL_BASED_OUTPATIENT_CLINIC_OR_DEPARTMENT_OTHER): Payer: Self-pay

## 2015-09-03 NOTE — Telephone Encounter (Signed)
Positive for chlamydia. Treated per protocol. [employer] faxed. Attempted to contact, no answer. Letter sent to address on file.

## 2015-09-03 NOTE — Telephone Encounter (Signed)
Spoke with pt. Verified ID. Informed of labs. Treated per protocol. [employer] form faxed. Pt informed to abstain from sexual activity x 10 days and to notify partner for testing and treatment.  

## 2015-09-06 ENCOUNTER — Telehealth: Payer: Self-pay | Admitting: *Deleted

## 2015-09-06 NOTE — Telephone Encounter (Incomplete)
Post ED Visit - Positive Culture Follow-up: Unsuccessful Patient Follow-up  Culture assessed and recommendations reviewed by: []  Enzo BiNathan Batchelder, Pharm.D. []  Celedonio MiyamotoJeremy Frens, Pharm.D., BCPS []  Garvin FilaMike Maccia, Pharm.D. []  Georgina PillionElizabeth Martin, Pharm.D., BCPS []  RaviaMinh Pham, VermontPharm.D., BCPS, AAHIVP []  Estella HuskMichelle Turner, Pharm.D., BCPS, AAHIVP []  Tennis Mustassie Stewart, Pharm.D. []  Sherle Poeob Vincent, [address]Pharm[address]  Positive *** culture  []  Patient discharged without antimicrobial prescription and treatment is now indicated []  Organism is resistant to prescribed ED discharge antimicrobial [x]  Patient with positive RPR   Unable to contact patient after 3 attempts, letter will be sent to address on file  Janice PearlRobertson, Janice White 09/06/2015, 9:01 AM

## 2015-10-28 ENCOUNTER — Encounter (HOSPITAL_COMMUNITY): Payer: Self-pay

## 2015-10-28 ENCOUNTER — Emergency Department (HOSPITAL_COMMUNITY)
Admission: EM | Admit: 2015-10-28 | Discharge: 2015-10-28 | Disposition: A | Discharge status: Home / Self Care | Payer: Self-pay | Attending: Emergency Medicine | Admitting: Emergency Medicine

## 2015-10-28 DIAGNOSIS — J029 Acute pharyngitis, unspecified: Secondary | ICD-10-CM | POA: Insufficient documentation

## 2015-10-28 DIAGNOSIS — J069 Acute upper respiratory infection, unspecified: Secondary | ICD-10-CM

## 2015-10-28 DIAGNOSIS — B9789 Other viral agents as the cause of diseases classified elsewhere: Secondary | ICD-10-CM

## 2015-10-28 DIAGNOSIS — J45909 Unspecified asthma, uncomplicated: Secondary | ICD-10-CM | POA: Insufficient documentation

## 2015-10-28 LAB — RAPID STREP SCREEN (MED CTR MEBANE ONLY): Streptococcus, Group A Screen (Direct): NEGATIVE

## 2015-10-28 MED ORDER — LORATADINE 10 MG PO TABS: 10.0000 mg | ORAL_TABLET | Freq: Every day | ORAL | 0 refills | Status: DC

## 2015-10-28 MED ORDER — BENZOCAINE-MENTHOL 6-10 MG MT LOZG: 1.0000 | LOZENGE | OROMUCOSAL | 0 refills | Status: DC | PRN

## 2015-10-28 MED ORDER — NAPROXEN 375 MG PO TABS: 375.0000 mg | ORAL_TABLET | Freq: Two times a day (BID) | ORAL | 0 refills | Status: DC

## 2015-10-28 MED ORDER — IBUPROFEN 400 MG PO TABS
600.0000 mg | ORAL_TABLET | Freq: Once | ORAL | Status: AC
  Administered 2015-10-28: 600 mg via ORAL
  Filled 2015-10-28: qty 1

## 2015-10-28 NOTE — Discharge Instructions (Signed)
Your strep screen today is negative. We are treating your for a viral sore throat and upper respiratory symptoms. Follow up with your doctor or return here for worsening symptoms.

## 2015-10-28 NOTE — ED Triage Notes (Signed)
Per Pt, Pt started to have a sore throat that started yesterday with congestion and cough. Pt denies fevers.

## 2015-10-28 NOTE — ED Notes (Signed)
E signature not available pt denies concerns

## 2015-10-28 NOTE — ED Provider Notes (Signed)
[facility]-EMERGENCY DEPT Provider Note   CSN: [id number] Arrival date & time: 10/28/15  1119     History   Chief Complaint Chief Complaint  Patient presents with  . Sore Throat    HPI Janice White is a 25 y.o. female who presents to the ED with a sore throat. Patient reports low grade fever. She has used hot tea with lemon that does help some. Patient reports cough with clear sputum.  The history is provided by the patient. No language interpreter was used.  Sore Throat  This is a new problem. The current episode started 2 days ago. The problem occurs constantly. The problem has not changed since onset.Pertinent negatives include no chest pain, no abdominal pain, no headaches and no shortness of breath. The symptoms are aggravated by drinking, swallowing and eating. Nothing relieves the symptoms.    Past Medical History:  Diagnosis Date  . Asthma     There are no active problems to display for this patient.   Past Surgical History:  Procedure Laterality Date  . WISDOM TOOTH EXTRACTION      OB History    Gravida Para Term Preterm AB Living   0 0 0 0 0 0   SAB TAB Ectopic Multiple Live Births   0 0 0 0         Home Medications    Prior to Admission medications   Medication Sig Start Date End Date Taking? Authorizing Provider  loratadine (CLARITIN) 10 MG tablet Take 1 tablet (10 mg total) by mouth daily. 10/28/15   Annalena Piatt Orlene OchM Kendahl Bumgardner, NP  naproxen (NAPROSYN) 375 MG tablet Take 1 tablet (375 mg total) by mouth 2 (two) times daily. 10/28/15   Shayleen Eppinger Orlene OchM Brycelyn Gambino, NP  nitrofurantoin, macrocrystal-monohydrate, (MACROBID) 100 MG capsule Take 1 capsule (100 mg total) by mouth 2 (two) times daily. 08/30/15   Tatyana Kirichenko, PA-C  sodium chloride (OCEAN) 0.65 % SOLN nasal spray Place 1 spray into both nostrils as needed for congestion. Patient not taking: Reported on 08/30/2015 08/01/15   [name], PA-C    Family History Family History  Problem Relation Age of Onset  . Diabetes  Mother   . Hypertension Mother   . Heart failure Mother   . Heart disease Father   . Stroke Father     Social History Social History  Substance Use Topics  . Smoking status: Never Smoker  . Smokeless tobacco: Never Used  . Alcohol use No     Allergies   Penicillins   Review of Systems Review of Systems  Constitutional: Positive for chills. Fever: low grade.  HENT: Positive for congestion and sore throat. Negative for ear pain, mouth sores, trouble swallowing and voice change.   Eyes: Negative for photophobia, redness, itching and visual disturbance.  Respiratory: Positive for cough. Negative for shortness of breath.   Cardiovascular: Negative for chest pain and leg swelling.  Gastrointestinal: Negative for abdominal pain, constipation, diarrhea, nausea and vomiting.  Genitourinary: Negative for dysuria, frequency and urgency.  Musculoskeletal: Positive for myalgias. Negative for back pain.  Skin: Negative for rash.  Neurological: Negative for syncope and headaches.  Psychiatric/Behavioral: Negative for confusion. The patient is not nervous/anxious.      Physical Exam Updated Vital Signs BP 113/72 (BP Location: Right Arm)   Pulse 91   Temp 99 F (37.2 C) (Oral)   Resp 16   Ht 4\' 9"  (1.448 m)   Wt 54.2 kg   SpO2 100%   BMI 25.86 kg/m  Physical Exam  Constitutional: She is oriented to person, place, and time. She appears well-developed and well-nourished. No distress.  HENT:  Head: Normocephalic and atraumatic.  Right Ear: Tympanic membrane normal.  Left Ear: Tympanic membrane normal.  Nose: Rhinorrhea present.  Mouth/Throat: Uvula is midline and mucous membranes are normal. No uvula swelling. Posterior oropharyngeal erythema present. No oropharyngeal exudate, posterior oropharyngeal edema or tonsillar abscesses.  Eyes: EOM are normal.  Neck: Neck supple.  Pulmonary/Chest: Effort normal.  Abdominal: Soft. There is no tenderness.  Musculoskeletal: Normal  range of motion.  Lymphadenopathy:    She has no cervical adenopathy.  Neurological: She is alert and oriented to person, place, and time. No cranial nerve deficit.  Skin: Skin is warm and dry.  Psychiatric: She has a normal mood and affect. Her behavior is normal.  Nursing note and vitals reviewed.    ED Treatments / Results  Labs (all labs ordered are listed, but only abnormal results are displayed) Labs Reviewed  RAPID STREP SCREEN (NOT AT [facility])  CULTURE, GROUP A STREP [facility])    Radiology No results found.  Procedures Procedures (including critical care time)  Medications Ordered in ED Medications  ibuprofen (ADVIL,MOTRIN) tablet 600 mg (600 mg Oral Given 10/28/15 1246)     Initial Impression / Assessment and Plan / ED Course  I have reviewed the triage vital signs and the nursing notes.  Pertinent lab results that were available during my care of the patient were reviewed by me and considered in my medical decision making (see chart for details).  Clinical Course    Final Clinical Impressions(s) / ED Diagnoses  25 y.o. female with sore throat, cough and mild congestion without fever stable for d/c with negative strep screen. No difficulty swallowing, no fever and does not appear toxic. Will treat for viral illness. Return precautions given.   Final diagnoses:  Pharyngitis  Viral URI with cough    New Prescriptions New Prescriptions   LORATADINE (CLARITIN) 10 MG TABLET    Take 1 tablet (10 mg total) by mouth daily.   NAPROXEN (NAPROSYN) 375 MG TABLET    Take 1 tablet (375 mg total) by mouth 2 (two) times daily.     [place], NP 10/28/15 1309    Donnetta Hutching, MD 10/29/15 [phone]

## 2015-10-30 LAB — CULTURE, GROUP A STREP (THRC)

## 2015-11-20 ENCOUNTER — Encounter (HOSPITAL_COMMUNITY): Payer: Self-pay | Admitting: *Deleted

## 2015-11-20 DIAGNOSIS — J45909 Unspecified asthma, uncomplicated: Secondary | ICD-10-CM | POA: Insufficient documentation

## 2015-11-20 DIAGNOSIS — N390 Urinary tract infection, site not specified: Secondary | ICD-10-CM | POA: Insufficient documentation

## 2015-11-20 DIAGNOSIS — Z79899 Other long term (current) drug therapy: Secondary | ICD-10-CM | POA: Insufficient documentation

## 2015-11-20 NOTE — ED Triage Notes (Signed)
The pt thinks she has a uti  She has pain when she unriates no blood seen.  She has frequency and feels like she cannot empty her bladder at times  lmp 2 days ago

## 2015-11-21 ENCOUNTER — Emergency Department (HOSPITAL_COMMUNITY)
Admission: EM | Admit: 2015-11-21 | Discharge: 2015-11-21 | Disposition: A | Discharge status: Home / Self Care | Payer: Self-pay | Attending: Emergency Medicine | Admitting: Emergency Medicine

## 2015-11-21 DIAGNOSIS — N390 Urinary tract infection, site not specified: Secondary | ICD-10-CM

## 2015-11-21 LAB — URINALYSIS, ROUTINE W REFLEX MICROSCOPIC
BILIRUBIN URINE: NEGATIVE
GLUCOSE, UA: NEGATIVE mg/dL
Ketones, ur: NEGATIVE mg/dL
Nitrite: POSITIVE — AB
PROTEIN: 100 mg/dL — AB
SPECIFIC GRAVITY, URINE: 1.014 (ref 1.005–1.030)
pH: 7 (ref 5.0–8.0)

## 2015-11-21 LAB — COMPREHENSIVE METABOLIC PANEL
ALBUMIN: 4.4 g/dL (ref 3.5–5.0)
ALK PHOS: 51 U/L (ref 38–126)
ALT: 12 U/L — AB (ref 14–54)
AST: 19 U/L (ref 15–41)
Anion gap: 5 (ref 5–15)
BUN: 9 mg/dL (ref 6–20)
CHLORIDE: 108 mmol/L (ref 101–111)
CO2: 24 mmol/L (ref 22–32)
CREATININE: 0.75 mg/dL (ref 0.44–1.00)
Calcium: 9.2 mg/dL (ref 8.9–10.3)
GFR calc non Af Amer: >60 mL/min (ref >60)
GLUCOSE: 98 mg/dL (ref 65–99)
Potassium: 3.9 mmol/L (ref 3.5–5.1)
SODIUM: 137 mmol/L (ref 135–145)
Total Bilirubin: 0.6 mg/dL (ref 0.3–1.2)
Total Protein: 6.6 g/dL (ref 6.5–8.1)

## 2015-11-21 LAB — POC URINE PREG, ED: Preg Test, Ur: NEGATIVE

## 2015-11-21 LAB — CBC
HCT: 36.9 % (ref 36.0–46.0)
Hemoglobin: 11.8 g/dL — ABNORMAL LOW (ref 12.0–15.0)
MCH: 27.2 pg (ref 26.0–34.0)
MCHC: 32 g/dL (ref 30.0–36.0)
MCV: 85 fL (ref 78.0–100.0)
PLATELETS: 290 10*3/uL (ref 150–400)
RBC: 4.34 MIL/uL (ref 3.87–5.11)
RDW: 13.8 % (ref 11.5–15.5)
WBC: 14.6 10*3/uL — ABNORMAL HIGH (ref 4.0–10.5)

## 2015-11-21 LAB — URINE MICROSCOPIC-ADD ON

## 2015-11-21 LAB — LIPASE, BLOOD: LIPASE: 43 U/L (ref 11–51)

## 2015-11-21 MED ORDER — SULFAMETHOXAZOLE-TRIMETHOPRIM 800-160 MG PO TABS
1.0000 | ORAL_TABLET | Freq: Once | ORAL | Status: AC
  Administered 2015-11-21: 1 via ORAL
  Filled 2015-11-21: qty 1

## 2015-11-21 MED ORDER — SULFAMETHOXAZOLE-TRIMETHOPRIM 800-160 MG PO TABS: 1.0000 | ORAL_TABLET | Freq: Two times a day (BID) | ORAL | 0 refills | Status: AC

## 2015-11-21 NOTE — ED Provider Notes (Signed)
[facility] DEPT Provider Note   CSN: [id number] Arrival date & time: 11/20/15  2316     History   Chief Complaint Chief Complaint  Patient presents with  . Abdominal Pain    HPI Janice White is a 25 y.o. female.  HPI  Patient presents with concern of dysuria.  Symptoms began 2 days ago, since onset symptoms of been persistent, with accompanying sensation of frequency, inability to void completely. No fever, no chills, no nausea, no vomiting. Patient took azathioprine, without a change in her condition. She states that she is generally well, denies other medical issues, no UTI in about one year.   Past Medical History:  Diagnosis Date  . Asthma     There are no active problems to display for this patient.   Past Surgical History:  Procedure Laterality Date  . WISDOM TOOTH EXTRACTION      OB History    Gravida Para Term Preterm AB Living   0 0 0 0 0 0   SAB TAB Ectopic Multiple Live Births   0 0 0 0         Home Medications    Prior to Admission medications   Medication Sig Start Date End Date Taking? Authorizing Provider  benzocaine-menthol (CHLORASEPTIC SORE THROAT) 6-10 MG lozenge Take 1 lozenge by mouth as needed for sore throat. 10/28/15   Hope Orlene Och, NP  loratadine (CLARITIN) 10 MG tablet Take 1 tablet (10 mg total) by mouth daily. 10/28/15   Hope Orlene Och, NP  naproxen (NAPROSYN) 375 MG tablet Take 1 tablet (375 mg total) by mouth 2 (two) times daily. 10/28/15   Hope Orlene Och, NP  sodium chloride (OCEAN) 0.65 % SOLN nasal spray Place 1 spray into both nostrils as needed for congestion. Patient not taking: Reported on 08/30/2015 08/01/15   Roxy Horseman, PA-C  sulfamethoxazole-trimethoprim (BACTRIM DS,SEPTRA DS) 800-160 MG tablet Take 1 tablet by mouth 2 (two) times daily. 11/21/15 11/28/15  Gerhard Munch, MD    Family History Family History  Problem Relation Age of Onset  . Diabetes Mother   . Hypertension Mother   . Heart failure Mother   .  Heart disease Father   . Stroke Father     Social History Social History  Substance Use Topics  . Smoking status: Never Smoker  . Smokeless tobacco: Never Used  . Alcohol use No     Allergies   Penicillins   Review of Systems Review of Systems  Constitutional:       Per HPI, otherwise negative  HENT:       Per HPI, otherwise negative  Respiratory:       Per HPI, otherwise negative  Cardiovascular:       Per HPI, otherwise negative  Gastrointestinal: Positive for nausea. Negative for vomiting.  Endocrine:       Negative aside from HPI  Genitourinary:       Neg aside from HPI   Musculoskeletal:       Per HPI, otherwise negative  Skin: Negative.   Allergic/Immunologic: Negative for immunocompromised state.  Neurological: Negative for syncope.     Physical Exam Updated Vital Signs BP 106/89 (BP Location: Left Arm)   Pulse 81   Temp 98.4 F (36.9 C) (Oral)   Resp 20   Ht 5' (1.524 m)   Wt 130 lb (59 kg)   LMP 11/18/2015   SpO2 100%   BMI 25.39 kg/m   Physical Exam  Constitutional: She is oriented  to person, place, and time. She appears well-developed and well-nourished. No distress.  HENT:  Head: Normocephalic and atraumatic.  Eyes: Conjunctivae and EOM are normal.  Cardiovascular: Normal rate and regular rhythm.   Pulmonary/Chest: Effort normal and breath sounds normal. No stridor. No respiratory distress.  Abdominal: She exhibits no distension.  Minimal discomfort with palpation about the abdomen  Musculoskeletal: She exhibits no edema.  Neurological: She is alert and oriented to person, place, and time. No cranial nerve deficit.  Skin: Skin is warm and dry.  Psychiatric: She has a normal mood and affect.  Nursing note and vitals reviewed.    ED Treatments / Results  Labs (all labs ordered are listed, but only abnormal results are displayed) Labs Reviewed  COMPREHENSIVE METABOLIC PANEL - Abnormal; Notable for the following:       Result Value     ALT 12 (*)    All other components within normal limits  CBC - Abnormal; Notable for the following:    WBC 14.6 (*)    Hemoglobin 11.8 (*)    All other components within normal limits  URINALYSIS, ROUTINE W REFLEX MICROSCOPIC (NOT AT [facility]) - Abnormal; Notable for the following:    Color, Urine ORANGE (*)    APPearance CLOUDY (*)    Hgb urine dipstick MODERATE (*)    Protein, ur 100 (*)    Nitrite POSITIVE (*)    Leukocytes, UA LARGE (*)    All other components within normal limits  URINE MICROSCOPIC-ADD ON - Abnormal; Notable for the following:    Squamous Epithelial / LPF 0-5 (*)    Bacteria, UA FEW (*)    All other components within normal limits  LIPASE, BLOOD  POC URINE PREG, ED    EKG  EKG Interpretation None       Radiology No results found.  Procedures Procedures (including critical care time)  Medications Ordered in ED Medications  sulfamethoxazole-trimethoprim (BACTRIM DS,SEPTRA DS) 800-160 MG per tablet 1 tablet (not administered)     Initial Impression / Assessment and Plan / ED Course  I have reviewed the triage vital signs and the nursing notes.  Pertinent labs & imaging results that were available during my care of the patient were reviewed by me and considered in my medical decision making (see chart for details).  Clinical Course    Well-appearing young female presents with evidence of urinary tract infection. Here, no evidence for pyelo-, no bacteremia, no sepsis. Patient started on antibiotics, will follow up with primary care.  Final Clinical Impressions(s) / ED Diagnoses   Final diagnoses:  Lower urinary tract infectious disease    New Prescriptions New Prescriptions   SULFAMETHOXAZOLE-TRIMETHOPRIM (BACTRIM DS,SEPTRA DS) 800-160 MG TABLET    Take 1 tablet by mouth 2 (two) times daily.     Gerhard Munchobert Beverlee Wilmarth, MD 11/21/15 [phone]

## 2015-12-10 ENCOUNTER — Telehealth (HOSPITAL_BASED_OUTPATIENT_CLINIC_OR_DEPARTMENT_OTHER): Payer: Self-pay | Admitting: Emergency Medicine

## 2015-12-10 NOTE — Telephone Encounter (Signed)
Lost to followup 

## 2015-12-13 ENCOUNTER — Emergency Department (HOSPITAL_COMMUNITY)
Admission: EM | Admit: 2015-12-13 | Discharge: 2015-12-14 | Disposition: A | Discharge status: Home / Self Care | Payer: Self-pay | Attending: Emergency Medicine | Admitting: Emergency Medicine

## 2015-12-13 ENCOUNTER — Encounter (HOSPITAL_COMMUNITY): Payer: Self-pay

## 2015-12-13 DIAGNOSIS — M545 Low back pain, unspecified: Secondary | ICD-10-CM

## 2015-12-13 DIAGNOSIS — J45909 Unspecified asthma, uncomplicated: Secondary | ICD-10-CM | POA: Insufficient documentation

## 2015-12-13 NOTE — ED Triage Notes (Signed)
Pt reports constant right flank pain, denies trouble urinating. Onset two weeks ago.

## 2015-12-14 ENCOUNTER — Emergency Department (HOSPITAL_COMMUNITY): Payer: Self-pay

## 2015-12-14 LAB — URINALYSIS, ROUTINE W REFLEX MICROSCOPIC
Bilirubin Urine: NEGATIVE
GLUCOSE, UA: NEGATIVE mg/dL
Ketones, ur: NEGATIVE mg/dL
Nitrite: NEGATIVE
Protein, ur: NEGATIVE mg/dL
SPECIFIC GRAVITY, URINE: 1.024 (ref 1.005–1.030)
pH: 7.5 (ref 5.0–8.0)

## 2015-12-14 LAB — URINE MICROSCOPIC-ADD ON

## 2015-12-14 LAB — POC URINE PREG, ED: PREG TEST UR: NEGATIVE

## 2015-12-14 MED ORDER — TRAMADOL HCL 50 MG PO TABS: 50.0000 mg | ORAL_TABLET | Freq: Four times a day (QID) | ORAL | 0 refills | Status: DC | PRN

## 2015-12-14 MED ORDER — CYCLOBENZAPRINE HCL 10 MG PO TABS: 10.0000 mg | ORAL_TABLET | Freq: Three times a day (TID) | ORAL | 0 refills | Status: DC | PRN

## 2015-12-14 MED ORDER — IBUPROFEN 800 MG PO TABS: 800.0000 mg | ORAL_TABLET | Freq: Three times a day (TID) | ORAL | 0 refills | Status: DC

## 2015-12-14 NOTE — ED Provider Notes (Signed)
[facility] Provider Note   CSN: [id number] Arrival date & time: 12/13/15  2138   By signing my name below, I, Janice White, attest that this documentation has been prepared under the direction and in the presence of Janice Creasehristopher J Hagan Maltz, MD . Electronically Signed: Freida Busmaniana White, Scribe. 12/14/2015. 12:13 AM.   History   Chief Complaint Chief Complaint  Patient presents with  . Flank Pain     The history is provided by the patient. No language interpreter was used.     HPI Comments:  Janice White is a 25 y.o. female who presents to the Emergency Department complaining of non-radiating constant, lower back pain x 2 weeks. She denies abdominal pain, urinary frequency, and urgency. She has a h/o UTI and notes symptom today is dissimilar. No alleviating factors noted. Pt has no other acute complaints or symptoms at this time.     Past Medical History:  Diagnosis Date  . Asthma     There are no active problems to display for this patient.   Past Surgical History:  Procedure Laterality Date  . WISDOM TOOTH EXTRACTION      OB History    Gravida Para Term Preterm AB Living   0 0 0 0 0 0   SAB TAB Ectopic Multiple Live Births   0 0 0 0         Home Medications    Prior to Admission medications   Medication Sig Start Date End Date Taking? Authorizing Provider  benzocaine-menthol (CHLORASEPTIC SORE THROAT) 6-10 MG lozenge Take 1 lozenge by mouth as needed for sore throat. 10/28/15   Janice Orlene OchM Neese, NP  cyclobenzaprine (FLEXERIL) 10 MG tablet Take 1 tablet (10 mg total) by mouth 3 (three) times daily as needed for muscle spasms. 12/14/15   Janice Creasehristopher J Rayshard Schirtzinger, MD  ibuprofen (ADVIL,MOTRIN) 800 MG tablet Take 1 tablet (800 mg total) by mouth 3 (three) times daily. 12/14/15   Janice Creasehristopher J Helem Reesor, MD  loratadine (CLARITIN) 10 MG tablet Take 1 tablet (10 mg total) by mouth daily. 10/28/15   Janice Orlene OchM Neese, NP  naproxen (NAPROSYN) 375 MG tablet Take 1 tablet (375  mg total) by mouth 2 (two) times daily. 10/28/15   Janice Orlene OchM Neese, NP  sodium chloride (OCEAN) 0.65 % SOLN nasal spray Place 1 spray into both nostrils as needed for congestion. Patient not taking: Reported on 08/30/2015 08/01/15   Janice Horsemanobert Browning, PA-C  traMADol (ULTRAM) 50 MG tablet Take 1 tablet (50 mg total) by mouth every 6 (six) hours as needed. 12/14/15   Janice Creasehristopher J Janice Thomure, MD    Family History Family History  Problem Relation Age of Onset  . Diabetes Mother   . Hypertension Mother   . Heart failure Mother   . Heart disease Father   . Stroke Father     Social History Social History  Substance Use Topics  . Smoking status: Never Smoker  . Smokeless tobacco: Never Used  . Alcohol use No     Allergies   Penicillins   Review of Systems Review of Systems  Constitutional: Negative for fever.  Gastrointestinal: Negative for abdominal pain.  Genitourinary: Negative for frequency and urgency.  Musculoskeletal: Positive for back pain.  All other systems reviewed and are negative.    Physical Exam Updated Vital Signs BP 106/66 (BP Location: Right Arm)   Pulse 63   Temp 98.4 F (36.9 C) (Oral)   Resp 16   Ht 5' (1.524 m)   Wt 122  lb (55.3 kg)   LMP 11/18/2015   SpO2 100%   BMI 23.83 kg/m   Physical Exam  Constitutional: She is oriented to person, place, and time. She appears well-developed and well-nourished. No distress.  HENT:  Head: Normocephalic and atraumatic.  Right Ear: Hearing normal.  Left Ear: Hearing normal.  Nose: Nose normal.  Mouth/Throat: Oropharynx is clear and moist and mucous membranes are normal.  Eyes: Conjunctivae and EOM are normal. Pupils are equal, round, and reactive to light.  Neck: Normal range of motion. Neck supple.  Cardiovascular: Regular rhythm, S1 normal and S2 normal.  Exam reveals no gallop and no friction rub.   No murmur heard. Pulmonary/Chest: Effort normal and breath sounds normal. No respiratory distress. She exhibits  no tenderness.  Abdominal: Soft. Normal appearance and bowel sounds are normal. There is no hepatosplenomegaly. There is no tenderness. There is no rebound, no guarding, no tenderness at McBurney's point and negative Murphy's sign. No hernia.  Musculoskeletal: Normal range of motion. She exhibits tenderness.  Diffuse lower lumbar and midline tenderness  Neurological: She is alert and oriented to person, place, and time. She has normal strength. No cranial nerve deficit or sensory deficit. Coordination normal. GCS eye subscore is 4. GCS verbal subscore is 5. GCS motor subscore is 6.  Skin: Skin is warm, dry and intact. No rash noted. No cyanosis.  Psychiatric: She has a normal mood and affect. Her speech is normal and behavior is normal. Thought content normal.  Nursing note and vitals reviewed.    ED Treatments / Results  DIAGNOSTIC STUDIES:  Oxygen Saturation is 100% on RA, normal by my interpretation.    COORDINATION OF CARE:  12:11 AM Discussed treatment plan with pt at bedside and pt agreed to plan.  Labs (all labs ordered are listed, but only abnormal results are displayed) Labs Reviewed  URINALYSIS, ROUTINE W REFLEX MICROSCOPIC (NOT AT [facility]) - Abnormal; Notable for the following:       Result Value   APPearance TURBID (*)    Hgb urine dipstick MODERATE (*)    Leukocytes, UA SMALL (*)    All other components within normal limits  URINE MICROSCOPIC-ADD ON - Abnormal; Notable for the following:    Squamous Epithelial / LPF 0-5 (*)    Bacteria, UA RARE (*)    All other components within normal limits  POC URINE PREG, ED    EKG  EKG Interpretation None       Radiology Dg Lumbar Spine Complete  Result Date: 12/14/2015 CLINICAL DATA:  Initial evaluation for low back pain for 2-3 weeks. No injury. EXAM: LUMBAR SPINE - COMPLETE 4+ VIEW COMPARISON:  None. FINDINGS: There is no evidence of lumbar spine fracture. Alignment is normal. Intervertebral disc spaces are maintained.  IMPRESSION: Negative. Electronically Signed   By: Rise Mu M.D.   On: 12/14/2015 02:51    Procedures Procedures (including critical care time)  Medications Ordered in ED Medications - No data to display   Initial Impression / Assessment and Plan / ED Course  I have reviewed the triage vital signs and the nursing notes.  Pertinent labs & imaging results that were available during my care of the patient were reviewed by me and considered in my medical decision making (see chart for details).  Clinical Course   Patient presents to the emergency department for evaluation of back pain. Patient was recently treated for urinary tract infection. She reports that the dysuria, urgency, frequency has resolved. She has, however,  had persistent low back pain. She does report that the pain worsens with movements. Examination revealed diffuse tenderness in the soft tissues without any radiation of pain to the legs or radiculopathy. Normal neurologic function in legs.  Final Clinical Impressions(s) / ED Diagnoses   Final diagnoses:  Acute bilateral low back pain without sciatica    New Prescriptions New Prescriptions   CYCLOBENZAPRINE (FLEXERIL) 10 MG TABLET    Take 1 tablet (10 mg total) by mouth 3 (three) times daily as needed for muscle spasms.   IBUPROFEN (ADVIL,MOTRIN) 800 MG TABLET    Take 1 tablet (800 mg total) by mouth 3 (three) times daily.   TRAMADOL (ULTRAM) 50 MG TABLET    Take 1 tablet (50 mg total) by mouth every 6 (six) hours as needed.   I personally performed the services described in this documentation, which was scribed in my presence. The recorded information has been reviewed and is accurate.     Janice Creasehristopher J Zylon Creamer, MD 12/14/15 [phone]

## 2015-12-14 NOTE — ED Notes (Signed)
Pt to radiology via stretcher.  

## 2015-12-14 NOTE — ED Notes (Signed)
Pt. Given graham crackers and peanut butter.

## 2015-12-14 NOTE — ED Notes (Addendum)
Pt returned from xray

## 2016-02-08 ENCOUNTER — Inpatient Hospital Stay (HOSPITAL_COMMUNITY)
Admission: AD | Admit: 2016-02-08 | Discharge: 2016-02-08 | Disposition: A | Discharge status: Home / Self Care | Payer: Self-pay | Source: Ambulatory Visit | Attending: Obstetrics and Gynecology | Admitting: Obstetrics and Gynecology

## 2016-02-08 ENCOUNTER — Encounter (HOSPITAL_COMMUNITY): Payer: Self-pay

## 2016-02-08 DIAGNOSIS — B373 Candidiasis of vulva and vagina: Secondary | ICD-10-CM | POA: Insufficient documentation

## 2016-02-08 DIAGNOSIS — Z3202 Encounter for pregnancy test, result negative: Secondary | ICD-10-CM

## 2016-02-08 DIAGNOSIS — N939 Abnormal uterine and vaginal bleeding, unspecified: Secondary | ICD-10-CM | POA: Insufficient documentation

## 2016-02-08 DIAGNOSIS — B3731 Acute candidiasis of vulva and vagina: Secondary | ICD-10-CM

## 2016-02-08 DIAGNOSIS — Z880 Allergy status to penicillin: Secondary | ICD-10-CM | POA: Insufficient documentation

## 2016-02-08 DIAGNOSIS — Z79899 Other long term (current) drug therapy: Secondary | ICD-10-CM | POA: Insufficient documentation

## 2016-02-08 LAB — URINALYSIS, ROUTINE W REFLEX MICROSCOPIC
Bilirubin Urine: NEGATIVE
GLUCOSE, UA: NEGATIVE mg/dL
Hgb urine dipstick: NEGATIVE
KETONES UR: NEGATIVE mg/dL
Nitrite: NEGATIVE
PROTEIN: NEGATIVE mg/dL
Specific Gravity, Urine: 1.024 (ref 1.005–1.030)
pH: 6 (ref 5.0–8.0)

## 2016-02-08 LAB — WET PREP, GENITAL
Clue Cells Wet Prep HPF POC: NONE SEEN
Sperm: NONE SEEN
TRICH WET PREP: NONE SEEN

## 2016-02-08 LAB — POCT PREGNANCY, URINE: PREG TEST UR: NEGATIVE

## 2016-02-08 MED ORDER — FLUCONAZOLE 150 MG PO TABS: 150.0000 mg | ORAL_TABLET | Freq: Every day | ORAL | 1 refills | Status: DC

## 2016-02-08 NOTE — MAU Provider Note (Signed)
History     CSN: [id number]  Arrival date and time: 02/08/16 0306   First Provider Initiated Contact with Patient 02/08/16 0350      Chief Complaint  Patient presents with  . Abdominal Pain  . Vaginal Bleeding   HPI Janice White is a 25 y.o. G0P0000 female who presents with abdominal cramping & vaginal bleeding. Reports pink/red spotting daily for the last 2 weeks. Also reports intermittent lower abdominal cramping. Rates pain 4/10; has not treated. Patient started on OCPs by [facility] last month to help regulate her cycles. Irregular cycles for the last 2 years since she stopped taking depo provera. States she takes her pill everyday. After speaking with patient; she admits that she only came in for a pregnancy test; states they have been trying to get pregnant, although she has been taking her OCP daily. Did not take HPT prior to coming to MAU.   OB History    Gravida Para Term Preterm AB Living   0 0 0 0 0 0   SAB TAB Ectopic Multiple Live Births   0 0 0 0        Past Medical History:  Diagnosis Date  . Asthma     Past Surgical History:  Procedure Laterality Date  . WISDOM TOOTH EXTRACTION      Family History  Problem Relation Age of Onset  . Diabetes Mother   . Hypertension Mother   . Heart failure Mother   . Heart disease Father   . Stroke Father     Social History  Substance Use Topics  . Smoking status: Never Smoker  . Smokeless tobacco: Never Used  . Alcohol use No    Allergies:  Allergies  Allergen Reactions  . Penicillins Itching    Has patient had a PCN reaction causing immediate rash, facial/tongue/throat swelling, SOB or lightheadedness with hypotension: No Has patient had a PCN reaction causing severe rash involving mucus membranes or skin necrosis: No Has patient had a PCN reaction that required hospitalization No Has patient had a PCN reaction occurring within the last 10 years: Yes If all of the above answers are "NO", then may  proceed with Cephalosporin use.     Prescriptions Prior to Admission  Medication Sig Dispense Refill Last Dose  . dutasteride (AVODART) 0.5 MG capsule Take 0.5 mg by mouth daily.     Marland Kitchen. PRESCRIPTION MEDICATION Birth Control Pills - prescribed by [facility]   02/07/2016 at Unknown time  . benzocaine-menthol (CHLORASEPTIC SORE THROAT) 6-10 MG lozenge Take 1 lozenge by mouth as needed for sore throat. 100 tablet 0   . cyclobenzaprine (FLEXERIL) 10 MG tablet Take 1 tablet (10 mg total) by mouth 3 (three) times daily as needed for muscle spasms. 20 tablet 0   . ibuprofen (ADVIL,MOTRIN) 800 MG tablet Take 1 tablet (800 mg total) by mouth 3 (three) times daily. 21 tablet 0   . loratadine (CLARITIN) 10 MG tablet Take 1 tablet (10 mg total) by mouth daily. 14 tablet 0   . naproxen (NAPROSYN) 375 MG tablet Take 1 tablet (375 mg total) by mouth 2 (two) times daily. 20 tablet 0   . sodium chloride (OCEAN) 0.65 % SOLN nasal spray Place 1 spray into both nostrils as needed for congestion. (Patient not taking: Reported on 08/30/2015) 1 Bottle 0 Not Taking at Unknown time  . traMADol (ULTRAM) 50 MG tablet Take 1 tablet (50 mg total) by mouth every 6 (six) hours as needed. 15 tablet  0     Review of Systems  Constitutional: Negative.   Gastrointestinal: Positive for abdominal pain. Negative for constipation, diarrhea, nausea and vomiting.  Genitourinary:       + vaginal bleeding   Physical Exam   Blood pressure 112/76, pulse 93, temperature 98.2 F (36.8 C), temperature source Oral, resp. rate 16, last menstrual period 01/28/2016, SpO2 100 %.  Physical Exam  Nursing note and vitals reviewed. Constitutional: She is oriented to person, place, and time. She appears well-developed and well-nourished. No distress.  HENT:  Head: Normocephalic and atraumatic.  Eyes: Conjunctivae are normal. Right eye exhibits no discharge. Left eye exhibits no discharge. No scleral icterus.  Neck: Normal range of  motion.  Cardiovascular: Normal rate.   Respiratory: Effort normal. No respiratory distress.  GI: Soft. She exhibits no distension. There is no tenderness. There is no rebound and no guarding.  Genitourinary: Uterus normal. Cervix exhibits no motion tenderness.  Neurological: She is alert and oriented to person, place, and time.  Skin: Skin is warm and dry. She is not diaphoretic.  Psychiatric: She has a normal mood and affect. Her behavior is normal. Judgment and thought content normal.    MAU Course  Procedures Results for orders placed or performed during the hospital encounter of 02/08/16 (from the past 24 hour(s))  Urinalysis, Routine w reflex microscopic     Status: Abnormal   Collection Time: 02/08/16  3:20 AM  Result Value Ref Range   Color, Urine YELLOW YELLOW   APPearance HAZY (A) CLEAR   Specific Gravity, Urine 1.024 1.005 - 1.030   pH 6.0 5.0 - 8.0   Glucose, UA NEGATIVE NEGATIVE mg/dL   Hgb urine dipstick NEGATIVE NEGATIVE   Bilirubin Urine NEGATIVE NEGATIVE   Ketones, ur NEGATIVE NEGATIVE mg/dL   Protein, ur NEGATIVE NEGATIVE mg/dL   Nitrite NEGATIVE NEGATIVE   Leukocytes, UA MODERATE (A) NEGATIVE   RBC / HPF 0-5 0 - 5 RBC/hpf   WBC, UA TOO NUMEROUS TO COUNT 0 - 5 WBC/hpf   Bacteria, UA RARE (A) NONE SEEN   Squamous Epithelial / LPF 6-30 (A) NONE SEEN   Mucous PRESENT   Pregnancy, urine POC     Status: None   Collection Time: 02/08/16  3:33 AM  Result Value Ref Range   Preg Test, Ur NEGATIVE NEGATIVE    MDM UPT negative GC/CT & wet prep  Assessment and Plan  A: 1. Negative pregnancy test   2. Vaginal yeast infection    P: Discharge home Rx fluconazole D/c OCP if desires pregnancy Contact info for [facility] to start PCP  Judeth HornErin Lulabelle Desta 02/08/2016, 3:47 AM

## 2016-02-08 NOTE — Discharge Instructions (Signed)

## 2016-02-08 NOTE — MAU Note (Signed)
Light period on December 12 th, since then spotting.  Abd cramps on and off since December 13th.

## 2016-02-08 NOTE — Progress Notes (Signed)
Computer signature not working for discharge - pt signed paper copy.

## 2016-02-10 ENCOUNTER — Encounter (HOSPITAL_COMMUNITY): Payer: Self-pay | Admitting: *Deleted

## 2016-02-10 ENCOUNTER — Inpatient Hospital Stay (HOSPITAL_COMMUNITY)
Admission: AD | Admit: 2016-02-10 | Discharge: 2016-02-10 | Disposition: A | Discharge status: Home / Self Care | Payer: Self-pay | Source: Ambulatory Visit | Attending: Obstetrics and Gynecology | Admitting: Obstetrics and Gynecology

## 2016-02-10 DIAGNOSIS — Z79899 Other long term (current) drug therapy: Secondary | ICD-10-CM | POA: Insufficient documentation

## 2016-02-10 DIAGNOSIS — Z880 Allergy status to penicillin: Secondary | ICD-10-CM | POA: Insufficient documentation

## 2016-02-10 DIAGNOSIS — R109 Unspecified abdominal pain: Secondary | ICD-10-CM | POA: Insufficient documentation

## 2016-02-10 DIAGNOSIS — B379 Candidiasis, unspecified: Secondary | ICD-10-CM | POA: Insufficient documentation

## 2016-02-10 DIAGNOSIS — N939 Abnormal uterine and vaginal bleeding, unspecified: Secondary | ICD-10-CM | POA: Insufficient documentation

## 2016-02-10 LAB — CBC
HEMATOCRIT: 35.5 % — AB (ref 36.0–46.0)
Hemoglobin: 11.7 g/dL — ABNORMAL LOW (ref 12.0–15.0)
MCH: 27.5 pg (ref 26.0–34.0)
MCHC: 33 g/dL (ref 30.0–36.0)
MCV: 83.3 fL (ref 78.0–100.0)
Platelets: 249 10*3/uL (ref 150–400)
RBC: 4.26 MIL/uL (ref 3.87–5.11)
RDW: 13.9 % (ref 11.5–15.5)
WBC: 8.4 10*3/uL (ref 4.0–10.5)

## 2016-02-10 NOTE — MAU Provider Note (Signed)
History   Janice White is a 25 year old G0P0000 here with complaints of vaginal spotting. She was seen in the MAU on 12/23 and was diagnosed with a yeast infection, but has not started treatment. She had a negative pregnancy test on the 23. She is back again because she is still bleeding. She has many questions about how long it will take for her to get pregnant and if there is anything she can do to "detox" from her birth control pill and try to conceive.   CSN: [id number]  Arrival date and time: 02/10/16 0002   First Provider Initiated Contact with Patient 02/10/16 0030      Chief Complaint  Patient presents with  . Vaginal Bleeding  . Abdominal Pain   HPI  OB History    Gravida Para Term Preterm AB Living   0 0 0 0 0 0   SAB TAB Ectopic Multiple Live Births   0 0 0 0        Past Medical History:  Diagnosis Date  . Asthma     Past Surgical History:  Procedure Laterality Date  . WISDOM TOOTH EXTRACTION      Family History  Problem Relation Age of Onset  . Diabetes Mother   . Hypertension Mother   . Heart failure Mother   . Heart disease Father   . Stroke Father     Social History  Substance Use Topics  . Smoking status: Never Smoker  . Smokeless tobacco: Never Used  . Alcohol use No    Allergies:  Allergies  Allergen Reactions  . Penicillins Itching    Has patient had a PCN reaction causing immediate rash, facial/tongue/throat swelling, SOB or lightheadedness with hypotension: No Has patient had a PCN reaction causing severe rash involving mucus membranes or skin necrosis: No Has patient had a PCN reaction that required hospitalization No Has patient had a PCN reaction occurring within the last 10 years: Yes If all of the above answers are "NO", then may proceed with Cephalosporin use.     Prescriptions Prior to Admission  Medication Sig Dispense Refill Last Dose  . benzocaine-menthol (CHLORASEPTIC SORE THROAT) 6-10 MG lozenge Take 1 lozenge by  mouth as needed for sore throat. 100 tablet 0   . cyclobenzaprine (FLEXERIL) 10 MG tablet Take 1 tablet (10 mg total) by mouth 3 (three) times daily as needed for muscle spasms. 20 tablet 0   . dutasteride (AVODART) 0.5 MG capsule Take 0.5 mg by mouth daily.     . fluconazole (DIFLUCAN) 150 MG tablet Take 1 tablet (150 mg total) by mouth daily. 1 tablet 1   . ibuprofen (ADVIL,MOTRIN) 800 MG tablet Take 1 tablet (800 mg total) by mouth 3 (three) times daily. 21 tablet 0   . loratadine (CLARITIN) 10 MG tablet Take 1 tablet (10 mg total) by mouth daily. 14 tablet 0   . naproxen (NAPROSYN) 375 MG tablet Take 1 tablet (375 mg total) by mouth 2 (two) times daily. 20 tablet 0   . PRESCRIPTION MEDICATION Birth Control Pills - prescribed by [facility]   02/07/2016 at Unknown time  . sodium chloride (OCEAN) 0.65 % SOLN nasal spray Place 1 spray into both nostrils as needed for congestion. (Patient not taking: Reported on 08/30/2015) 1 Bottle 0 Not Taking at Unknown time  . traMADol (ULTRAM) 50 MG tablet Take 1 tablet (50 mg total) by mouth every 6 (six) hours as needed. 15 tablet 0  Review of Systems  Constitutional: Negative.   HENT: Negative.   Eyes: Negative.   Respiratory: Negative.   Cardiovascular: Negative.   Gastrointestinal: Negative.   Genitourinary: Negative.   Musculoskeletal: Negative.   Skin: Negative.   Neurological: Negative.   Endo/Heme/Allergies: Negative.   Psychiatric/Behavioral: Negative.    Physical Exam   Blood pressure 127/82, pulse 85, temperature 98.4 F (36.9 C), temperature source Oral, resp. rate 17, last menstrual period 01/28/2016.  Physical Exam  Constitutional: She appears well-developed and well-nourished.  HENT:  Head: Normocephalic.  Neck: Normal range of motion.  Respiratory: Effort normal. No respiratory distress. She has no wheezes. She has no rales. She exhibits no tenderness.  GI: Soft. She exhibits no distension and no mass. There is no  tenderness. There is no rebound and no guarding.  Genitourinary: Vagina normal.  Genitourinary Comments: External genitalia and labial walls normal with no lesions. Cervix is visually closed, pink with no lesions. Trace amounts of dark brown blood in the vaginal vault.     MAU Course  Procedures  MDM CBC -normal Pregnancy test not repeated bc it was negative on 12/23.  GC CT cultures pending from 12/23 visit   Assessment and Plan   1. Vaginal bleeding, abnormal     2. Patient to be discharged home with [facility]. Patient cannot recall the name of the birth control that she has recently switched too. I suggested that [facility] in [facility] to discuss family planning, pre-conception counseling,  and potential break-through bleeding. I also encouraged patient to fill her prescription for Diflucan for yeast.     Charlesetta GaribaldiKathryn Lorraine Jaelen Gellerman CNM 02/10/2016, 12:48 AM

## 2016-02-10 NOTE — MAU Note (Signed)
Vaginal bleeding and cramping

## 2016-02-10 NOTE — Discharge Instructions (Signed)
Abnormal Uterine Bleeding Abnormal uterine bleeding means bleeding from the vagina that is not your normal menstrual period. This can be:  Bleeding or spotting between periods.  Bleeding after sex (sexual intercourse).  Bleeding that is heavier or more than normal.  Periods that last longer than usual.  Bleeding after menopause. There are many problems that may cause this. Treatment will depend on the cause of the bleeding. Any kind of bleeding that is not normal should be reviewed by your doctor. Follow these instructions at home: Watch your condition for any changes. These actions may lessen any discomfort you are having:  Do not use tampons or douches as told by your doctor.  Change your pads often. You should get regular pelvic exams and Pap tests. Keep all appointments for tests as told by your doctor. Contact a doctor if:  You are bleeding for more than 1 week.  You feel dizzy at times. Get help right away if:  You pass out.  You have to change pads every 15 to 30 minutes.  You have belly pain.  You have a fever.  You become sweaty or weak.  You are passing large blood clots from the vagina.  You feel sick to your stomach (nauseous) and throw up (vomit). This information is not intended to replace advice given to you by your health care provider. Make sure you discuss any questions you have with your health care provider. Document Released: 11/30/2008 Document Revised: 07/11/2015 Document Reviewed: 09/01/2012 Elsevier Interactive Patient Education  2017 [employer]  

## 2016-02-11 LAB — GC/CHLAMYDIA PROBE AMP (~~LOC~~) NOT AT ARMC
Chlamydia: NEGATIVE
NEISSERIA GONORRHEA: NEGATIVE

## 2016-03-03 ENCOUNTER — Encounter (HOSPITAL_COMMUNITY): Payer: Self-pay

## 2016-03-03 ENCOUNTER — Emergency Department (HOSPITAL_COMMUNITY)
Admission: EM | Admit: 2016-03-03 | Discharge: 2016-03-04 | Disposition: A | Discharge status: Home / Self Care | Payer: Self-pay | Attending: Emergency Medicine | Admitting: Emergency Medicine

## 2016-03-03 DIAGNOSIS — N390 Urinary tract infection, site not specified: Secondary | ICD-10-CM | POA: Insufficient documentation

## 2016-03-03 DIAGNOSIS — Z79899 Other long term (current) drug therapy: Secondary | ICD-10-CM | POA: Insufficient documentation

## 2016-03-03 DIAGNOSIS — J45909 Unspecified asthma, uncomplicated: Secondary | ICD-10-CM | POA: Insufficient documentation

## 2016-03-03 LAB — COMPREHENSIVE METABOLIC PANEL
ALBUMIN: 4.2 g/dL (ref 3.5–5.0)
ALK PHOS: 48 U/L (ref 38–126)
ALT: 12 U/L — AB (ref 14–54)
AST: 21 U/L (ref 15–41)
Anion gap: 7 (ref 5–15)
BUN: 5 mg/dL — ABNORMAL LOW (ref 6–20)
CALCIUM: 9.5 mg/dL (ref 8.9–10.3)
CHLORIDE: 107 mmol/L (ref 101–111)
CO2: 24 mmol/L (ref 22–32)
Creatinine, Ser: 0.68 mg/dL (ref 0.44–1.00)
GFR calc non Af Amer: >60 mL/min (ref >60)
GLUCOSE: 84 mg/dL (ref 65–99)
Potassium: 3.5 mmol/L (ref 3.5–5.1)
SODIUM: 138 mmol/L (ref 135–145)
Total Bilirubin: 0.4 mg/dL (ref 0.3–1.2)
Total Protein: 6.8 g/dL (ref 6.5–8.1)

## 2016-03-03 LAB — CBC
HCT: 36.6 % (ref 36.0–46.0)
Hemoglobin: 12 g/dL (ref 12.0–15.0)
MCH: 27.3 pg (ref 26.0–34.0)
MCHC: 32.8 g/dL (ref 30.0–36.0)
MCV: 83.4 fL (ref 78.0–100.0)
PLATELETS: 347 10*3/uL (ref 150–400)
RBC: 4.39 MIL/uL (ref 3.87–5.11)
RDW: 13.5 % (ref 11.5–15.5)
WBC: 9.1 10*3/uL (ref 4.0–10.5)

## 2016-03-03 LAB — URINALYSIS, ROUTINE W REFLEX MICROSCOPIC
Bilirubin Urine: NEGATIVE
Glucose, UA: NEGATIVE mg/dL
Hgb urine dipstick: NEGATIVE
KETONES UR: NEGATIVE mg/dL
Nitrite: NEGATIVE
PROTEIN: NEGATIVE mg/dL
Specific Gravity, Urine: 1.028 (ref 1.005–1.030)
pH: 6 (ref 5.0–8.0)

## 2016-03-03 LAB — LIPASE, BLOOD: LIPASE: 45 U/L (ref 11–51)

## 2016-03-03 LAB — POC URINE PREG, ED: Preg Test, Ur: NEGATIVE

## 2016-03-03 NOTE — ED Notes (Signed)
Pt believes that she is pregnant, delayed start of menstrual period and constant nausea.

## 2016-03-03 NOTE — ED Provider Notes (Signed)
[facility]-EMER[facility]ENCY DEPT Provider Note   CSN: [id number] Arrival date & time: 03/03/16  2125   By signing my name below, I, Clovis PuAvnee Patel, attest that this documentation has been prepared under the direction and in the presence of Gilda Creasehristopher J Pollina, MD  Electronically Signed: Clovis PuAvnee Patel, ED Scribe. 03/03/16. 12:05 AM.   History   Chief Complaint Chief Complaint  Patient presents with  . Abdominal Pain  . Possible Pregnancy  . Nausea   The history is provided by the patient. No language interpreter was used.   HPI Comments:  Janice White is a 26 y.o. female who presents to the Emergency Department complaining of persistent, intermittent, upper abdominal pain x several days. She also reports nausea and states she is a weeks late for her menstrual period. No alleviating factors noted. Pt denies vomiting, diarrhea, dysuria, frequency and urgency. No other associated symptoms noted.   Past Medical History:  Diagnosis Date  . Asthma     There are no active problems to display for this patient.   Past Surgical History:  Procedure Laterality Date  . WISDOM TOOTH EXTRACTION      OB History    Gravida Para Term Preterm AB Living   0 0 0 0 0 0   SAB TAB Ectopic Multiple Live Births   0 0 0 0         Home Medications    Prior to Admission medications   Medication Sig Start Date End Date Taking? Authorizing Provider  benzocaine-menthol (CHLORASEPTIC SORE THROAT) 6-10 MG lozenge Take 1 lozenge by mouth as needed for sore throat. 10/28/15   [name], NP  cephALEXin (KEFLEX) 500 MG capsule Take 1 capsule (500 mg total) by mouth 2 (two) times daily. 03/04/16   Gilda Creasehristopher J Pollina, MD  cyclobenzaprine (FLEXERIL) 10 MG tablet Take 1 tablet (10 mg total) by mouth 3 (three) times daily as needed for muscle spasms. 12/14/15   Gilda Creasehristopher J Pollina, MD  fluconazole (DIFLUCAN) 150 MG tablet Take 1 tablet (150 mg total) by mouth once a week. 03/04/16   Gilda Creasehristopher J Pollina, MD    loratadine (CLARITIN) 10 MG tablet Take 1 tablet (10 mg total) by mouth daily. 10/28/15   [name], NP  naproxen (NAPROSYN) 375 MG tablet Take 1 tablet (375 mg total) by mouth 2 (two) times daily. 10/28/15   [name], NP  PRESCRIPTION MEDICATION Birth Control Pills - [facility] by [facility]    Historical Provider, MD  sodium chloride (OCEAN) 0.65 % SOLN nasal spray Place 1 spray into both nostrils as needed for congestion. Patient not taking: Reported on 08/30/2015 08/01/15   [name], PA-C  traMADol (ULTRAM) 50 MG tablet Take 1 tablet (50 mg total) by mouth every 6 (six) hours as needed. 12/14/15   Gilda Creasehristopher J Pollina, MD    Family History Family History  Problem Relation Age of Onset  . Diabetes Mother   . Hypertension Mother   . Heart failure Mother   . Heart disease Father   . Stroke Father     Social History Social History  Substance Use Topics  . Smoking status: Never Smoker  . Smokeless tobacco: Never Used  . Alcohol use No     Allergies   Penicillins   Review of Systems Review of Systems  Gastrointestinal: Positive for abdominal pain and nausea. Negative for diarrhea and vomiting.  Genitourinary: Negative for dysuria, frequency and urgency.  All other systems reviewed and are negative.  Physical Exam Updated Vital Signs BP 108/58   Temp 98.4 F (36.9 C) (Oral)   Resp 20   Ht 5' (1.524 m)   Wt 120 lb (54.4 kg)   LMP 01/28/2016   SpO2 98%   BMI 23.44 kg/m   Physical Exam  Constitutional: She is oriented to person, place, and time. She appears well-developed and well-nourished. No distress.  HENT:  Head: Normocephalic and atraumatic.  Right Ear: Hearing normal.  Left Ear: Hearing normal.  Nose: Nose normal.  Mouth/Throat: Oropharynx is clear and moist and mucous membranes are normal.  Eyes: Conjunctivae and EOM are normal. Pupils are equal, round, and reactive to light.  Neck: Normal range of motion. Neck supple.   Cardiovascular: Regular rhythm, S1 normal and S2 normal.  Exam reveals no gallop and no friction rub.   No murmur heard. Pulmonary/Chest: Effort normal and breath sounds normal. No respiratory distress. She exhibits no tenderness.  Abdominal: Soft. Normal appearance and bowel sounds are normal. There is no hepatosplenomegaly. There is tenderness. There is no rebound, no guarding, no tenderness at McBurney's point and negative Murphy's sign. No hernia.  Slight upper abdominal tenderness   Musculoskeletal: Normal range of motion.  Neurological: She is alert and oriented to person, place, and time. She has normal strength. No cranial nerve deficit or sensory deficit. Coordination normal. GCS eye subscore is 4. GCS verbal subscore is 5. GCS motor subscore is 6.  Skin: Skin is warm, dry and intact. No rash noted. No cyanosis.  Psychiatric: She has a normal mood and affect. Her speech is normal and behavior is normal. Thought content normal.  Nursing note and vitals reviewed.  ED Treatments / Results  DIAGNOSTIC STUDIES:  Oxygen Saturation is 98% on RA, normal by my interpretation.    COORDINATION OF CARE:  12:00 AM Discussed treatment plan with pt at bedside and pt agreed to plan.  Labs (all labs ordered are listed, but only abnormal results are displayed) Labs Reviewed  COMPREHENSIVE METABOLIC PANEL - Abnormal; Notable for the following:       Result Value   BUN 5 (*)    ALT 12 (*)    All other components within normal limits  URINALYSIS, ROUTINE W REFLEX MICROSCOPIC - Abnormal; Notable for the following:    APPearance HAZY (*)    Leukocytes, UA SMALL (*)    Bacteria, UA RARE (*)    Squamous Epithelial / LPF 0-5 (*)    All other components within normal limits  LIPASE, BLOOD  CBC  POC URINE PREG, ED    EKG  EKG Interpretation None       Radiology No results found.  Procedures Procedures (including critical care time)  Medications Ordered in ED Medications - No data  to display   Initial Impression / Assessment and Plan / ED Course  I have reviewed the triage vital signs and the nursing notes.  Pertinent labs & imaging results that were available during my care of the patient were reviewed by me and considered in my medical decision making (see chart for details).  Clinical Course    Patient presents with complaints of intermittent left-sided abdominal pain and cramping. She is concerned because she is approximately one week overdue for her menstrual period. Urine pregnancy, however, is negative. Abdominal exam is benign. Patient has very slight left upper abdominal tenderness, no pelvic tenderness at this time. No right-sided tenderness. Patient not complaining of abnormal vaginal bleeding or discharge. Urinalysis does show obvious signs of  infection, blood work unremarkable. Will treat for UTI, refer to OB/GYN for further evaluation of irregular menstrual cycle.  Final Clinical Impressions(s) / ED Diagnoses   Final diagnoses:  Lower urinary tract infectious disease    New Prescriptions New Prescriptions   CEPHALEXIN (KEFLEX) 500 MG CAPSULE    Take 1 capsule (500 mg total) by mouth 2 (two) times daily.   FLUCONAZOLE (DIFLUCAN) 150 MG TABLET    Take 1 tablet (150 mg total) by mouth once a week.  I personally performed the services described in this documentation, which was scribed in my presence. The recorded information has been reviewed and is accurate.     Gilda Crease, MD 03/04/16 [phone]

## 2016-03-03 NOTE — ED Triage Notes (Signed)
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 Pt is in nad and speaking in full sentences; Pt request pregnancy test.

## 2016-03-04 MED ORDER — FLUCONAZOLE 150 MG PO TABS: 150.0000 mg | ORAL_TABLET | ORAL | 0 refills | Status: DC

## 2016-03-04 MED ORDER — CEPHALEXIN 500 MG PO CAPS: 500.0000 mg | ORAL_CAPSULE | Freq: Two times a day (BID) | ORAL | 0 refills | Status: DC

## 2016-04-21 DIAGNOSIS — Z79899 Other long term (current) drug therapy: Secondary | ICD-10-CM | POA: Insufficient documentation

## 2016-04-21 DIAGNOSIS — B373 Candidiasis of vulva and vagina: Secondary | ICD-10-CM | POA: Insufficient documentation

## 2016-04-21 DIAGNOSIS — J45909 Unspecified asthma, uncomplicated: Secondary | ICD-10-CM | POA: Insufficient documentation

## 2016-04-22 ENCOUNTER — Encounter (HOSPITAL_COMMUNITY): Payer: Self-pay | Admitting: Emergency Medicine

## 2016-04-22 ENCOUNTER — Emergency Department (HOSPITAL_COMMUNITY)
Admission: EM | Admit: 2016-04-22 | Discharge: 2016-04-22 | Disposition: A | Discharge status: Home / Self Care | Payer: Self-pay | Attending: Emergency Medicine | Admitting: Emergency Medicine

## 2016-04-22 DIAGNOSIS — B3731 Acute candidiasis of vulva and vagina: Secondary | ICD-10-CM

## 2016-04-22 DIAGNOSIS — B373 Candidiasis of vulva and vagina: Secondary | ICD-10-CM

## 2016-04-22 LAB — WET PREP, GENITAL
Clue Cells Wet Prep HPF POC: NONE SEEN
SPERM: NONE SEEN
Trich, Wet Prep: NONE SEEN

## 2016-04-22 LAB — POC URINE PREG, ED: PREG TEST UR: NEGATIVE

## 2016-04-22 LAB — GC/CHLAMYDIA PROBE AMP (~~LOC~~) NOT AT ARMC
Chlamydia: NEGATIVE
Neisseria Gonorrhea: NEGATIVE

## 2016-04-22 MED ORDER — FLUCONAZOLE 100 MG PO TABS
150.0000 mg | ORAL_TABLET | Freq: Once | ORAL | Status: AC
  Administered 2016-04-22: 150 mg via ORAL
  Filled 2016-04-22: qty 2

## 2016-04-22 NOTE — ED Triage Notes (Signed)
Pt presents with vaginal bleeding (LMP 03/29/16 - states they're irregular but this doesn't seem like her period), vaginal itching, and vaginal swelling x 3 days. Pt denies discharge, and urinary symptoms.

## 2016-04-22 NOTE — ED Provider Notes (Signed)
[facility] Provider Note   CSN: [id number] Arrival date & time: 04/21/16  2350     History   Chief Complaint Chief Complaint  Patient presents with  . Vaginal Bleeding    HPI Janice White is a 26 y.o. female with No major medical history presents to the Emergency Department complaining of gradual, persistent, progressively worsening vaginal discharge onset 3 days ago. Patient reports using over-the-counter Monistat without relief. She has associated irritation and itching of the labia and some vaginal bleeding today. LMP: 03/28/2016. Patient reports her. Is generally irregular. She is sexually active with one female partner and without birth control. She states she has had this partner 2 months and they have both tested negative for STDs. No aggravating or alleviating factors. Patient denies fever, chills, nausea, vomiting, abdominal pain, vaginal lesions, history of herpes.  The history is provided by the patient and medical records. No language interpreter was used.    Past Medical History:  Diagnosis Date  . Asthma     There are no active problems to display for this patient.   Past Surgical History:  Procedure Laterality Date  . WISDOM TOOTH EXTRACTION      OB History    Gravida Para Term Preterm AB Living   0 0 0 0 0 0   SAB TAB Ectopic Multiple Live Births   0 0 0 0         Home Medications    Prior to Admission medications   Medication Sig Start Date End Date Taking? Authorizing Provider  benzocaine-menthol (CHLORASEPTIC SORE THROAT) 6-10 MG lozenge Take 1 lozenge by mouth as needed for sore throat. 10/28/15   Hope Orlene Och, NP  cephALEXin (KEFLEX) 500 MG capsule Take 1 capsule (500 mg total) by mouth 2 (two) times daily. 03/04/16   Gilda Crease, MD  cyclobenzaprine (FLEXERIL) 10 MG tablet Take 1 tablet (10 mg total) by mouth 3 (three) times daily as needed for muscle spasms. 12/14/15   Gilda Crease, MD  fluconazole (DIFLUCAN) 150 MG  tablet Take 1 tablet (150 mg total) by mouth once a week. 03/04/16   Gilda Crease, MD  loratadine (CLARITIN) 10 MG tablet Take 1 tablet (10 mg total) by mouth daily. 10/28/15   Hope Orlene Och, NP  naproxen (NAPROSYN) 375 MG tablet Take 1 tablet (375 mg total) by mouth 2 (two) times daily. 10/28/15   Hope Orlene Och, NP  PRESCRIPTION MEDICATION Birth Control Pills - prescribed by [facility]    Historical Provider, MD  sodium chloride (OCEAN) 0.65 % SOLN nasal spray Place 1 spray into both nostrils as needed for congestion. Patient not taking: Reported on 08/30/2015 08/01/15   Roxy Horseman, PA-C  traMADol (ULTRAM) 50 MG tablet Take 1 tablet (50 mg total) by mouth every 6 (six) hours as needed. 12/14/15   Gilda Crease, MD    Family History Family History  Problem Relation Age of Onset  . Diabetes Mother   . Hypertension Mother   . Heart failure Mother   . Heart disease Father   . Stroke Father     Social History Social History  Substance Use Topics  . Smoking status: Never Smoker  . Smokeless tobacco: Never Used  . Alcohol use No     Allergies   Penicillins   Review of Systems Review of Systems  Genitourinary: Positive for vaginal bleeding, vaginal discharge and vaginal pain.  All other systems reviewed and are negative.    Physical  Exam Updated Vital Signs BP 105/66 (BP Location: Left Arm)   Pulse 77   Temp 98.9 F (37.2 C) (Oral)   Resp 16   LMP 03/29/2016 (Exact Date)   SpO2 99%   Physical Exam  Constitutional: She appears well-developed and well-nourished. No distress.  HENT:  Head: Normocephalic and atraumatic.  Eyes: Conjunctivae are normal.  Neck: Normal range of motion.  Cardiovascular: Normal rate, regular rhythm, normal heart sounds and intact distal pulses.   No murmur heard. Pulmonary/Chest: Effort normal and breath sounds normal. No respiratory distress. She has no wheezes.  Abdominal: Soft. Bowel sounds are normal. There is no  tenderness. There is no rebound and no guarding. Hernia confirmed negative in the right inguinal area and confirmed negative in the left inguinal area.  Genitourinary: Uterus normal. No labial fusion. There is no rash, tenderness or lesion on the right labia. There is no rash, tenderness or lesion on the left labia. Uterus is not deviated, not enlarged, not fixed and not tender. Cervix exhibits no motion tenderness, no discharge and no friability. Right adnexum displays no mass, no tenderness and no fullness. Left adnexum displays no mass, no tenderness and no fullness. No erythema, tenderness or bleeding in the vagina. No foreign body in the vagina. No signs of injury around the vagina. Vaginal discharge (Thick, white, clinging to the vaginal vault) found.  Genitourinary Comments: No blood in the vaginal vault No friability of the cervix Irritation and minimal edema noted to the bilateral labia without vesicles, lacerations or open lesions  Musculoskeletal: Normal range of motion. She exhibits no edema.  Lymphadenopathy:       Right: No inguinal adenopathy present.       Left: No inguinal adenopathy present.  Neurological: She is alert.  Skin: Skin is warm and dry. She is not diaphoretic. No erythema.  Psychiatric: She has a normal mood and affect.  Nursing note and vitals reviewed.    ED Treatments / Results  Labs (all labs ordered are listed, but only abnormal results are displayed) Labs Reviewed  WET PREP, GENITAL - Abnormal; Notable for the following:       Result Value   Yeast Wet Prep HPF POC PRESENT (*)    WBC, Wet Prep HPF POC MANY (*)    All other components within normal limits  POC URINE PREG, ED  GC/CHLAMYDIA PROBE AMP ([place]) NOT AT [facility]     Procedures Procedures (including critical care time)  Medications Ordered in ED Medications  fluconazole (DIFLUCAN) tablet 150 mg (not administered)     Initial Impression / Assessment and Plan / ED Course  I have  reviewed the triage vital signs and the nursing notes.  Pertinent labs & imaging results that were available during my care of the patient were reviewed by me and considered in my medical decision making (see chart for details).     Patient with history and physical are consistent with vulvovaginitis secondary to yeast. Low risk for STD. Patient does not wish for prophylactic treatment today. I think it is reasonable to wait for her cultures. Yeast treated with Diflucan here in the emergency department. Will refer to OB/GYN for further evaluation. No evidence of PID on exam. Pregnancy test is negative.  Final Clinical Impressions(s) / ED Diagnoses   Final diagnoses:  Vaginal yeast infection    New Prescriptions New Prescriptions   No medications on file     Dierdre ForthHannah Amberia Bayless, PA-C 04/22/16 0309    Tomasita CrumbleAdeleke Oni, MD  04/22/16 0548  

## 2016-04-22 NOTE — Discharge Instructions (Signed)
1. Medications: usual home medications 2. Treatment: rest, drink plenty of fluids,  3. Follow Up: Please followup with your OB/GYN in 3-7 days for discussion of your diagnoses and further evaluation after today's visit; if you do not have a primary care doctor use the resource guide provided to find one; Please return to the ER for abdominal pain, fevers, chills, vomiting or other concerns

## 2016-05-10 ENCOUNTER — Encounter (HOSPITAL_COMMUNITY): Payer: Self-pay

## 2016-05-10 ENCOUNTER — Emergency Department (HOSPITAL_COMMUNITY)
Admission: EM | Admit: 2016-05-10 | Discharge: 2016-05-11 | Disposition: A | Discharge status: Home / Self Care | Payer: Self-pay | Attending: Emergency Medicine | Admitting: Emergency Medicine

## 2016-05-10 DIAGNOSIS — J45909 Unspecified asthma, uncomplicated: Secondary | ICD-10-CM | POA: Insufficient documentation

## 2016-05-10 DIAGNOSIS — R112 Nausea with vomiting, unspecified: Secondary | ICD-10-CM | POA: Insufficient documentation

## 2016-05-10 DIAGNOSIS — R110 Nausea: Secondary | ICD-10-CM

## 2016-05-10 LAB — URINALYSIS, ROUTINE W REFLEX MICROSCOPIC
BILIRUBIN URINE: NEGATIVE
Bacteria, UA: NONE SEEN
GLUCOSE, UA: NEGATIVE mg/dL
Hgb urine dipstick: NEGATIVE
KETONES UR: 5 mg/dL — AB
Nitrite: NEGATIVE
PH: 6 (ref 5.0–8.0)
Protein, ur: NEGATIVE mg/dL
SPECIFIC GRAVITY, URINE: 1.025 (ref 1.005–1.030)

## 2016-05-10 LAB — COMPREHENSIVE METABOLIC PANEL
ALK PHOS: 45 U/L (ref 38–126)
ALT: 12 U/L — AB (ref 14–54)
AST: 19 U/L (ref 15–41)
Albumin: 4.5 g/dL (ref 3.5–5.0)
Anion gap: 11 (ref 5–15)
BUN: 8 mg/dL (ref 6–20)
CHLORIDE: 103 mmol/L (ref 101–111)
CO2: 23 mmol/L (ref 22–32)
CREATININE: 0.65 mg/dL (ref 0.44–1.00)
Calcium: 9.5 mg/dL (ref 8.9–10.3)
Glucose, Bld: 91 mg/dL (ref 65–99)
Potassium: 4.2 mmol/L (ref 3.5–5.1)
Sodium: 137 mmol/L (ref 135–145)
TOTAL PROTEIN: 7.7 g/dL (ref 6.5–8.1)
Total Bilirubin: 0.8 mg/dL (ref 0.3–1.2)

## 2016-05-10 LAB — CBC
HCT: 39.7 % (ref 36.0–46.0)
Hemoglobin: 12.9 g/dL (ref 12.0–15.0)
MCH: 26.9 pg (ref 26.0–34.0)
MCHC: 32.5 g/dL (ref 30.0–36.0)
MCV: 82.9 fL (ref 78.0–100.0)
PLATELETS: 274 10*3/uL (ref 150–400)
RBC: 4.79 MIL/uL (ref 3.87–5.11)
RDW: 13.6 % (ref 11.5–15.5)
WBC: 11.4 10*3/uL — AB (ref 4.0–10.5)

## 2016-05-10 LAB — LIPASE, BLOOD: LIPASE: 30 U/L (ref 11–51)

## 2016-05-10 MED ORDER — ONDANSETRON 4 MG PO TBDP
8.0000 mg | ORAL_TABLET | Freq: Once | ORAL | Status: AC
  Administered 2016-05-10: 8 mg via ORAL
  Filled 2016-05-10: qty 2

## 2016-05-10 NOTE — ED Triage Notes (Signed)
Pt reports nausea, vomiting and mild abdominal cramping, onset "a good minute" but the vomiting started tonight around 1900. LMP this month.

## 2016-05-10 NOTE — ED Notes (Signed)
Pt states nausea has improved some, Dr Adriana Simasook notified.

## 2016-05-11 LAB — I-STAT BETA HCG BLOOD, ED (MC, WL, AP ONLY): I-stat hCG, quantitative: <5 m[IU]/mL (ref <5)

## 2016-05-11 LAB — PREGNANCY, URINE: PREG TEST UR: NEGATIVE

## 2016-05-11 MED ORDER — ONDANSETRON HCL 4 MG PO TABS: 4.0000 mg | ORAL_TABLET | Freq: Four times a day (QID) | ORAL | 0 refills | Status: DC

## 2016-05-11 NOTE — ED Notes (Signed)
Pts family standing in doorway states they are ready to go

## 2016-05-11 NOTE — ED Notes (Signed)
Pt stable, ambulatory, states understanding of discharge instructions 

## 2016-05-11 NOTE — Discharge Instructions (Signed)
Medication for nausea. Pregnancy test was negative. Clear liquids.

## 2016-05-12 NOTE — ED Provider Notes (Signed)
[facility]-EMERGENCY DEPT Provider Note   CSN: [id number] Arrival date & time: 05/10/16  2026     History   Chief Complaint Chief Complaint  Patient presents with  . Nausea    HPI Janice White is a 26 y.o. female.  Patient presents with generalized abd cramping for 2 days with nausea and one episode of vomiting today. Her last menstrual period was in early March, but she is concerned she may be pregnant. She is normally healthy. No fever, sweats, chills, vaginal bleeding or discharge. Severity of symptoms mild.      Past Medical History:  Diagnosis Date  . Asthma     There are no active problems to display for this patient.   Past Surgical History:  Procedure Laterality Date  . WISDOM TOOTH EXTRACTION      OB History    Gravida Para Term Preterm AB Living   0 0 0 0 0 0   SAB TAB Ectopic Multiple Live Births   0 0 0 0         Home Medications    Prior to Admission medications   Medication Sig Start Date End Date Taking? Authorizing Provider  ondansetron (ZOFRAN) 4 MG tablet Take 1 tablet (4 mg total) by mouth every 6 (six) hours. 05/11/16   Donnetta HutchingBrian Jenella Craigie, MD    Family History Family History  Problem Relation Age of Onset  . Diabetes Mother   . Hypertension Mother   . Heart failure Mother   . Heart disease Father   . Stroke Father     Social History Social History  Substance Use Topics  . Smoking status: Never Smoker  . Smokeless tobacco: Never Used  . Alcohol use No     Allergies   Penicillins   Review of Systems Review of Systems  All other systems reviewed and are negative.    Physical Exam Updated Vital Signs BP 126/78   Pulse 87   Temp 98.8 F (37.1 C) (Oral)   Resp 17   LMP 04/16/2016   SpO2 100%   Physical Exam  Constitutional: She is oriented to person, place, and time. She appears well-developed and well-nourished.  HENT:  Head: Normocephalic and atraumatic.  Eyes: Conjunctivae are normal.  Neck: Neck supple.    Cardiovascular: Normal rate and regular rhythm.   Pulmonary/Chest: Effort normal and breath sounds normal.  Abdominal: Soft. Bowel sounds are normal.  Musculoskeletal: Normal range of motion.  Neurological: She is alert and oriented to person, place, and time.  Skin: Skin is warm and dry.  Psychiatric: She has a normal mood and affect. Her behavior is normal.  Nursing note and vitals reviewed.    ED Treatments / Results  Labs (all labs ordered are listed, but only abnormal results are displayed) Labs Reviewed  COMPREHENSIVE METABOLIC PANEL - Abnormal; Notable for the following:       Result Value   ALT 12 (*)    All other components within normal limits  CBC - Abnormal; Notable for the following:    WBC 11.4 (*)    All other components within normal limits  URINALYSIS, ROUTINE W REFLEX MICROSCOPIC - Abnormal; Notable for the following:    APPearance HAZY (*)    Ketones, ur 5 (*)    Leukocytes, UA TRACE (*)    Squamous Epithelial / LPF 6-30 (*)    All other components within normal limits  LIPASE, BLOOD  PREGNANCY, URINE  I-STAT BETA HCG BLOOD, ED ([facility], [facility], [facility] ONLY)  EKG  EKG Interpretation None       Radiology No results found.  Procedures Procedures (including critical care time)  Medications Ordered in ED Medications  ondansetron (ZOFRAN-ODT) disintegrating tablet 8 mg (8 mg Oral Given 05/10/16 2102)     Initial Impression / Assessment and Plan / ED Course  I have reviewed the triage vital signs and the nursing notes.  Pertinent labs & imaging results that were available during my care of the patient were reviewed by me and considered in my medical decision making (see chart for details).     No acute abdomen. Pregnancy test is negative. D/C meds Zofran 4 mg  Final Clinical Impressions(s) / ED Diagnoses   Final diagnoses:  Nausea    New Prescriptions Discharge Medication List as of 05/11/2016 12:23 AM    START taking these medications    Details  ondansetron (ZOFRAN) 4 MG tablet Take 1 tablet (4 mg total) by mouth every 6 (six) hours., Starting Mon 05/11/2016, Print         Donnetta Hutching, MD 05/12/16 [phone]

## 2016-06-09 ENCOUNTER — Encounter (INDEPENDENT_AMBULATORY_CARE_PROVIDER_SITE_OTHER): Payer: Self-pay | Admitting: Physician Assistant

## 2016-06-09 ENCOUNTER — Ambulatory Visit (INDEPENDENT_AMBULATORY_CARE_PROVIDER_SITE_OTHER): Payer: Self-pay | Admitting: Physician Assistant

## 2016-06-09 VITALS — BP 125/75 | HR 95 | Temp 97.9°F | Ht 60.0 in | Wt 130.0 lb

## 2016-06-09 DIAGNOSIS — Z23 Encounter for immunization: Secondary | ICD-10-CM

## 2016-06-09 DIAGNOSIS — Z309 Encounter for contraceptive management, unspecified: Secondary | ICD-10-CM

## 2016-06-09 DIAGNOSIS — N3000 Acute cystitis without hematuria: Secondary | ICD-10-CM

## 2016-06-09 DIAGNOSIS — N926 Irregular menstruation, unspecified: Secondary | ICD-10-CM | POA: Insufficient documentation

## 2016-06-09 DIAGNOSIS — R350 Frequency of micturition: Secondary | ICD-10-CM

## 2016-06-09 LAB — POCT URINALYSIS DIPSTICK
BILIRUBIN UA: NEGATIVE
Glucose, UA: NEGATIVE
NITRITE UA: NEGATIVE
PH UA: 5.5 (ref 5.0–8.0)
PROTEIN UA: 30
Spec Grav, UA: >=1.03 — AB (ref 1.010–1.025)
Urobilinogen, UA: 1 E.U./dL

## 2016-06-09 LAB — POCT URINE PREGNANCY: PREG TEST UR: NEGATIVE

## 2016-06-09 MED ORDER — CIPROFLOXACIN HCL 500 MG PO TABS: 500.0000 mg | ORAL_TABLET | Freq: Two times a day (BID) | ORAL | 0 refills | Status: AC

## 2016-06-09 NOTE — Patient Instructions (Signed)

## 2016-06-09 NOTE — Progress Notes (Signed)
  Subjective:  Patient ID: Janice White, female    DOB: 11/20/1990  Age: 26 y.o. MRN: [id number]  CC: irregular menstrual periods  HPI Janice White is a 26 y.o. female with a PMH of asthma presents with menstrual irregularity since having taken Depo Provera 3 years ago. She notices irregularity in that her menstrual periods are either a week early or a week late. Has been trying to conceive for about a year but has but unsuccessful. She also wants to address suprapubic "tightness" and urinary frequency. Believes she has a UTI. Denies vaginal discharge, genital lesions, dyspareunia, f/c/n/v, back pain, flank pain, CP, SOB, HA, rash, or GI sxs.   ROS Review of Systems  Constitutional: Negative for chills, fever and malaise/fatigue.  Eyes: Negative for blurred vision.  Respiratory: Negative for shortness of breath.   Cardiovascular: Negative for chest pain and palpitations.  Gastrointestinal: Negative for abdominal pain and nausea.  Genitourinary: Negative for dysuria and hematuria.  Musculoskeletal: Negative for joint pain and myalgias.  Skin: Negative for rash.  Neurological: Negative for tingling and headaches.  Psychiatric/Behavioral: Negative for depression. The patient is not nervous/anxious.     Objective:  BP 125/75 (BP Location: Left Arm, Patient Position: Sitting, Cuff Size: Normal)   Pulse 95   Temp 97.9 F (36.6 C) (Oral)   Ht 5' (1.524 m)   Wt 130 lb (59 kg)   LMP 05/09/2016 (Approximate)   SpO2 100%   BMI 25.39 kg/m   BP/Weight 06/09/2016 05/11/2016 04/22/2016  Systolic BP 125 126 105  Diastolic BP 75 78 66  Wt. (Lbs) 130 - -  BMI 25.39 - -      Physical Exam  Constitutional: She is oriented to person, place, and time.  Well developed, well nourished, NAD, polite  HENT:  Head: Normocephalic and atraumatic.  Eyes: No scleral icterus.  Neck: Normal range of motion. Neck supple. No thyromegaly present.  Cardiovascular: Normal rate, regular rhythm and normal  heart sounds.   Pulmonary/Chest: Effort normal and breath sounds normal.  Abdominal: Soft. Bowel sounds are normal. There is no tenderness.  Musculoskeletal: She exhibits no edema.  Neurological: She is alert and oriented to person, place, and time.  Skin: Skin is warm and dry. No rash noted. No erythema. No pallor.  Psychiatric: She has a normal mood and affect. Her behavior is normal. Thought content normal.  Vitals reviewed.    Assessment & Plan:   1. Irregular menstrual cycle - TSH - FSH/LH  2. Encounter for contraceptive management, unspecified type - POCT urine pregnancy negative in clinic today.  3. Urinary frequency - Urinalysis Dipstick shows small leukocytes  4. Acute cystitis without hematuria - Urine culture - ciprofloxacin (CIPRO) 500 MG tablet; Take 1 tablet (500 mg total) by mouth 2 (two) times daily.  Dispense: 6 tablet; Refill: 0  5. Need for Tdap vaccination - Tdap vaccine greater than or equal to 7yo IM   Meds ordered this encounter  Medications  . ciprofloxacin (CIPRO) 500 MG tablet    Sig: Take 1 tablet (500 mg total) by mouth 2 (two) times daily.    Dispense:  6 tablet    Refill:  0    Order Specific Question:   Supervising Provider    Answer:   Quentin Angst [id number]    Follow-up: Return if symptoms worsen or fail to improve.   Loletta Specter PA

## 2016-06-10 LAB — TSH: TSH: 1.33 u[IU]/mL (ref 0.450–4.500)

## 2016-06-10 LAB — FSH/LH
FSH: 2.1 m[IU]/mL
LH: 4.4 m[IU]/mL

## 2016-06-11 LAB — URINE CULTURE

## 2016-07-03 ENCOUNTER — Telehealth (INDEPENDENT_AMBULATORY_CARE_PROVIDER_SITE_OTHER): Payer: Self-pay | Admitting: Physician Assistant

## 2016-07-03 NOTE — Telephone Encounter (Signed)
Spoke with patient and relayed lab results again. Maryjean Mornempestt S Roberts, CMA

## 2016-07-03 NOTE — Telephone Encounter (Signed)
Patient left voicemail requesting lab results Please follow up with patient

## 2016-07-08 LAB — GLUCOSE, POCT (MANUAL RESULT ENTRY): POC GLUCOSE: 89 mg/dL (ref 70–99)

## 2016-08-02 ENCOUNTER — Emergency Department (HOSPITAL_COMMUNITY)
Admission: EM | Admit: 2016-08-02 | Discharge: 2016-08-02 | Disposition: A | Discharge status: Home / Self Care | Payer: Self-pay | Attending: Emergency Medicine | Admitting: Emergency Medicine

## 2016-08-02 ENCOUNTER — Encounter (HOSPITAL_COMMUNITY): Payer: Self-pay | Admitting: Emergency Medicine

## 2016-08-02 DIAGNOSIS — B373 Candidiasis of vulva and vagina: Secondary | ICD-10-CM | POA: Insufficient documentation

## 2016-08-02 DIAGNOSIS — Z79899 Other long term (current) drug therapy: Secondary | ICD-10-CM | POA: Insufficient documentation

## 2016-08-02 DIAGNOSIS — J45909 Unspecified asthma, uncomplicated: Secondary | ICD-10-CM | POA: Insufficient documentation

## 2016-08-02 DIAGNOSIS — B3731 Acute candidiasis of vulva and vagina: Secondary | ICD-10-CM

## 2016-08-02 DIAGNOSIS — N898 Other specified noninflammatory disorders of vagina: Secondary | ICD-10-CM

## 2016-08-02 LAB — URINALYSIS, ROUTINE W REFLEX MICROSCOPIC
Bilirubin Urine: NEGATIVE
Glucose, UA: NEGATIVE mg/dL
HGB URINE DIPSTICK: NEGATIVE
KETONES UR: NEGATIVE mg/dL
NITRITE: NEGATIVE
PH: 6 (ref 5.0–8.0)
Protein, ur: NEGATIVE mg/dL
SPECIFIC GRAVITY, URINE: 1.028 (ref 1.005–1.030)

## 2016-08-02 LAB — WET PREP, GENITAL
Clue Cells Wet Prep HPF POC: NONE SEEN
SPERM: NONE SEEN
Trich, Wet Prep: NONE SEEN

## 2016-08-02 LAB — POC URINE PREG, ED: PREG TEST UR: NEGATIVE

## 2016-08-02 MED ORDER — FLUCONAZOLE 150 MG PO TABS: 150.0000 mg | ORAL_TABLET | Freq: Every day | ORAL | 0 refills | Status: DC

## 2016-08-02 NOTE — ED Triage Notes (Signed)
C/o yeast infection x 3-4 days.  Used generic monistat without relief.

## 2016-08-02 NOTE — ED Provider Notes (Signed)
[facility]-EMER[facility]ENCY DEPT Provider Note   CSN: [id number] Arrival date & time: 08/02/16  2007  By signing my name below, I, Janice White, attest that this documentation has been prepared under the direction and in the presence of non-physician practitioner, Lyndel Safe, PA-C. Electronically Signed: Modena White, Scribe. 08/02/2016. 10:32 PM.  History   Chief Complaint Chief Complaint  Patient presents with  . Vaginal Itching   The history is provided by the patient. No language interpreter was used.   HPI Comments: Janice White is a 26 y.o. female who presents to the Emergency Department complaining of intermittent moderate vaginal itching that started about 3 days ago. She suspects she has a yeast infection as she feels similar to prior yeast infection. She tried 2-3 doses of OTC cream without relief. Last intercourse: 1 month ago with female partner. Denies any fever, sore throat, abdominal pain, nausea, vomiting, dysuria, vaginal discharge, vaginal pain, or other complaints at this time.  Past Medical History:  Diagnosis Date  . Asthma     Patient Active Problem List   Diagnosis Date Noted  . Irregular menstrual cycle 06/09/2016    Past Surgical History:  Procedure Laterality Date  . WISDOM TOOTH EXTRACTION      OB History    Gravida Para Term Preterm AB Living   0 0 0 0 0 0   SAB TAB Ectopic Multiple Live Births   0 0 0 0         Home Medications    Prior to Admission medications   Medication Sig Start Date End Date Taking? Authorizing Provider  fluconazole (DIFLUCAN) 150 MG tablet Take 1 tablet (150 mg total) by mouth daily. 08/02/16   Cristina Gong, PA-C    Family History Family History  Problem Relation Age of Onset  . Diabetes Mother   . Hypertension Mother   . Heart failure Mother   . Heart disease Father   . Stroke Father     Social History Social History  Substance Use Topics  . Smoking status: Never Smoker  . Smokeless tobacco: Never  Used  . Alcohol use No     Allergies   Penicillins   Review of Systems Review of Systems  Constitutional: Negative for fever.  HENT: Negative for sore throat.   Gastrointestinal: Negative for abdominal pain.  Genitourinary: Positive for vaginal pain (Itching, swelling, pain/discomfort). Negative for difficulty urinating, dysuria, flank pain, genital sores, pelvic pain, urgency, vaginal bleeding and vaginal discharge.       +vaginal itching  Neurological: Negative for headaches.     Physical Exam Updated Vital Signs BP 107/70 (BP Location: Left Arm)   Pulse 82   Temp 98.8 F (37.1 C) (Oral)   Resp 17   Ht 4\' 11"  (1.499 m)   Wt 134 lb 8 oz (61 kg)   LMP 07/09/2016   SpO2 100%   BMI 27.17 kg/m   Physical Exam  Constitutional: She appears well-developed and well-nourished. No distress.  HENT:  Head: Normocephalic and atraumatic.  Abdominal: Soft. Bowel sounds are normal. She exhibits no distension and no mass. There is no tenderness.  Genitourinary: Pelvic exam was performed with patient supine. There is no rash or lesion on the right labia. There is no rash or lesion on the left labia. Cervix exhibits no motion tenderness and no discharge. Right adnexum displays no mass, no tenderness and no fullness. Left adnexum displays no mass, no tenderness and no fullness. No erythema or bleeding in the vagina.  Vaginal discharge (White, chunky/creamy discharge) found.  Neurological: She is alert.  Skin: Skin is warm and dry.  Psychiatric: She has a normal mood and affect. Her behavior is normal.  Nursing note and vitals reviewed.    ED Treatments / Results  DIAGNOSTIC STUDIES: Oxygen Saturation is 100% on RA, normal by my interpretation.    COORDINATION OF CARE: 10:36 PM- Pt advised of plan for treatment and pt agrees.  Labs (all labs ordered are listed, but only abnormal results are displayed) Labs Reviewed  WET PREP, GENITAL - Abnormal; Notable for the following:        Result Value   Yeast Wet Prep HPF POC PRESENT (*)    WBC, Wet Prep HPF POC MANY (*)    All other components within normal limits  URINALYSIS, ROUTINE White REFLEX MICROSCOPIC - Abnormal; Notable for the following:    APPearance HAZY (*)    Leukocytes, UA LARGE (*)    Bacteria, UA RARE (*)    Squamous Epithelial / LPF 6-30 (*)    All other components within normal limits  URINE CULTURE  RAPID HIV SCREEN (HIV 1/2 AB+AG)  RPR  POC URINE PREG, ED  GC/CHLAMYDIA PROBE AMP ([place]    EKG  EKG Interpretation None       Radiology No results found.  Procedures Procedures (including critical care time)  Medications Ordered in ED Medications - No data to display   Initial Impression / Assessment and Plan / ED Course  I have reviewed the triage vital signs and the nursing notes.  Pertinent labs & imaging results that were available during my care of the patient were reviewed by me and considered in my medical decision making (see chart for details).    Patient to be discharged with instructions to follow up with OBGYN. Discussed importance of using protection when sexually active. Pt understands that they have GC/Chlamydia cultures pending and that they will need to inform all sexual partners if results return positive. Pt has been treated prophylacticly with azithromycin and rocephin due to pts history, pelvic exam, and wet prep with increased WBCs. UA with out UTI, with contamination.  Pt not concerning for PID because hemodynamically stable and no cervical motion tenderness on pelvic exam. Pt has also been treated with Fluconazole for Bacterial candidiasis.      Final Clinical Impressions(s) / ED Diagnoses   Final diagnoses:  Vaginal itching  Vaginal yeast infection    New Prescriptions Discharge Medication List as of 08/02/2016 11:48 PM    START taking these medications   Details  fluconazole (DIFLUCAN) 150 MG tablet Take 1 tablet (150 mg total) by mouth  daily., Starting Sun 08/02/2016, Print       I personally performed the services described in this documentation, which was scribed in my presence. The recorded information has been reviewed and is accurate.     Janice White, Janice Shedlock W, PA-C 08/03/16 Margarito Courser0123    Benjiman CorePickering, Nathan, MD 08/04/16 [phone]

## 2016-08-02 NOTE — Discharge Instructions (Signed)
You have cultures for syphilis, HIV, gonorrhea and chlamydia pending. No new since good news. If you are positive or need treatment he will be contacted.

## 2016-08-03 LAB — RAPID HIV SCREEN (HIV 1/2 AB+AG)
HIV 1/2 ANTIBODIES: NONREACTIVE
HIV-1 P24 ANTIGEN - HIV24: NONREACTIVE

## 2016-08-03 LAB — RPR: RPR Ser Ql: NONREACTIVE

## 2016-08-03 LAB — GC/CHLAMYDIA PROBE AMP (~~LOC~~) NOT AT ARMC
CHLAMYDIA, DNA PROBE: NEGATIVE
NEISSERIA GONORRHEA: NEGATIVE

## 2016-08-04 LAB — URINE CULTURE

## 2016-08-06 ENCOUNTER — Ambulatory Visit (INDEPENDENT_AMBULATORY_CARE_PROVIDER_SITE_OTHER): Payer: Self-pay | Admitting: Physician Assistant

## 2016-08-06 ENCOUNTER — Encounter (INDEPENDENT_AMBULATORY_CARE_PROVIDER_SITE_OTHER): Payer: Self-pay | Admitting: Physician Assistant

## 2016-08-06 VITALS — BP 123/71 | HR 91 | Temp 98.1°F | Resp 18 | Ht 59.0 in | Wt 133.0 lb

## 2016-08-06 DIAGNOSIS — N921 Excessive and frequent menstruation with irregular cycle: Secondary | ICD-10-CM

## 2016-08-06 DIAGNOSIS — N979 Female infertility, unspecified: Secondary | ICD-10-CM

## 2016-08-06 LAB — POCT URINE PREGNANCY: Preg Test, Ur: NEGATIVE

## 2016-08-06 MED ORDER — CLOMIPHENE CITRATE 50 MG PO TABS: 50.0000 mg | ORAL_TABLET | Freq: Every day | ORAL | 0 refills | Status: AC

## 2016-08-06 NOTE — Patient Instructions (Signed)
Wait until your US has been completed before you are cleared to take Clomiphene.    Clomiphene tablets What is this medicine? CLOMIPHENE (KLOE mi feen) is a fertility drug that increases the chance of pregnancy. It helps women ovulate (produce a mature egg) during their cycle. This medicine may be used for other purposes; ask your health care provider or pharmacist if you have questions. COMMON BRAND NAME(S): Clomid, Serophene What should I tell my health care provider before I take this medicine? They need to know if you have any of these conditions: -adrenal gland disease -blood vessel disease or blood clots -cyst on the ovary -endometriosis -liver disease -ovarian cancer -pituitary gland disease -vaginal bleeding that has not been evaluated -an unusual or allergic reaction to clomiphene, other medicines, foods, dyes, or preservatives -pregnant (should not be used if you are already pregnant) -breast-feeding How should I use this medicine? Take this medicine by mouth with a glass of water. Follow the directions on the prescription label. Take exactly as directed for the exact number of days prescribed. Take your doses at regular intervals. Most women take this medicine for a 5 day period, but the length of treatment may be adjusted. Your doctor will give you a start date for this medication and will give you instructions on proper use. Do not take your medicine more often than directed. Talk to your pediatrician regarding the use of this medicine in children. Special care may be needed. Overdosage: If you think you have taken too much of this medicine contact a poison control center or emergency room at once. NOTE: This medicine is only for you. Do not share this medicine with others. What if I miss a dose? If you miss a dose, take it as soon as you can. If it is almost time for your next dose, take only that dose. Do not take double or extra doses. What may interact with this  medicine? -herbal or dietary supplements, like blue cohosh, black cohosh, chasteberry, or DHEA -prasterone This list may not describe all possible interactions. Give your health care provider a list of all the medicines, herbs, non-prescription drugs, or dietary supplements you use. Also tell them if you smoke, drink alcohol, or use illegal drugs. Some items may interact with your medicine. What should I watch for while using this medicine? Make sure you understand how and when to use this medicine. You need to know when you are ovulating and when to have sexual intercourse. This will increase the chance of a pregnancy. Visit your doctor or health care professional for regular checks on your progress. You may need tests to check the hormone levels in your blood or you may have to use home-urine tests to check for ovulation. Try to keep any appointments. Compared to other fertility treatments, this medicine does not greatly increase your chances of having multiple babies. An increased chance of having twins may occur in roughly 5 out of every 100 women who take this medication. Stop taking this medicine at once and contact your doctor or health care professional if you think you are pregnant. This medicine is not for long-term use. Most women that benefit from this medicine do so within the first three cycles (months). Your doctor or health care professional will monitor your condition. This medicine is usually used for a total of 6 cycles of treatment. You may get drowsy or dizzy. Do not drive, use machinery, or do anything that needs mental alertness until you know how this  drug affects you. Do not stand or sit up quickly. This reduces the risk of dizzy or fainting spells. Drinking alcoholic beverages or smoking tobacco may decrease your chance of becoming pregnant. Limit or stop alcohol and tobacco use during your fertility treatments. What side effects may I notice from receiving this medicine? Side  effects that you should report to your doctor or health care professional as soon as possible: -allergic reactions like skin rash, itching or hives, swelling of the face, lips, or tongue -breathing problems -changes in vision -fluid retention -nausea, vomiting -pelvic pain or bloating -severe abdominal pain -sudden weight gain Side effects that usually do not require medical attention (report to your doctor or health care professional if they continue or are bothersome): -breast discomfort -hot flashes -mild pelvic discomfort -mild nausea This list may not describe all possible side effects. Call your doctor for medical advice about side effects. You may report side effects to FDA at 1-800-FDA-1088. Where should I keep my medicine? Keep out of the reach of children. Store at room temperature between 15 and 30 degrees C (59 and 86 degrees F). Protect from heat, light, and moisture. Throw away any unused medicine after the expiration date. NOTE: This sheet is a summary. It may not cover all possible information. If you have questions about this medicine, talk to your doctor, pharmacist, or health care provider.  2018 Elsevier/Gold Standard (2007-05-16 22:21:06)

## 2016-08-06 NOTE — Progress Notes (Signed)
  Subjective:  Patient ID: Janice White, female    DOB: November 28, 1990  Age: 26 y.o. MRN: [id number]  CC: difficulty conceiving  HPI Janice White is a 26 y.o. female with a PMH of Syphillis and Asthma presents with difficulty conceiving. She was on Depo-Provera for many years and stopped in 2016. Has had an irregular menstrual period since then and is having trouble conceiving. Has been trying for one year now. TSH, FSH, and LH are normal. Says boyfriend had a normal semen analysis. Wants a medication that will regulate her cycle. Does not endorse any other symptoms or complaints.      ROS Review of Systems  Constitutional: Negative for chills, fever and malaise/fatigue.  Eyes: Negative for blurred vision.  Respiratory: Negative for shortness of breath.   Cardiovascular: Negative for chest pain and palpitations.  Gastrointestinal: Negative for abdominal pain and nausea.  Genitourinary: Negative for dysuria and hematuria.  Musculoskeletal: Negative for joint pain and myalgias.  Skin: Negative for rash.  Neurological: Negative for tingling and headaches.  Psychiatric/Behavioral: Negative for depression. The patient is not nervous/anxious.     Objective:  BP 123/71 (BP Location: Right Arm, Patient Position: Sitting, Cuff Size: Normal)   Pulse 91   Temp 98.1 F (36.7 C) (Oral)   Resp 18   Ht 4\' 11"  (1.499 m)   Wt 133 lb (60.3 kg)   LMP 07/09/2016   SpO2 100%   BMI 26.86 kg/m   BP/Weight 08/06/2016 08/02/2016 07/08/2016  Systolic BP 123 106 110  Diastolic BP 71 72 76  Wt. (Lbs) 133 134.5 -  BMI 26.86 27.17 -      Physical Exam  Constitutional: She is oriented to person, place, and time.  Well developed, well nourished, NAD, polite  HENT:  Head: Normocephalic and atraumatic.  Eyes: No scleral icterus.  Neck: Normal range of motion. Neck supple. No thyromegaly present.  Musculoskeletal: She exhibits no edema.  Neurological: She is alert and oriented to person, place, and  time.  Skin: Skin is warm and dry. No rash noted. No erythema. No pallor.  Psychiatric: She has a normal mood and affect. Her behavior is normal. Thought content normal.  Vitals reviewed.    Assessment & Plan:   1. Metrorrhagia - POCT urine pregnancy negative in clinic today. - US Pelvis Complete; Future - US Transvaginal Non-OB; Future   2. Infertility, female - Begin Clomiphene 50mg  after US pelvic and transvaginal has been completed and you are cleared by me to begin Clomiphene.   Meds ordered this encounter  Medications  . clomiPHENE (CLOMID) 50 MG tablet    Sig: Take 1 tablet (50 mg total) by mouth daily.    Dispense:  5 tablet    Refill:  0    Order Specific Question:   Supervising Provider    Answer:   Quentin AngstJEGEDE, OLUGBEMIGA E [id number]    Follow-up: Return in about 4 weeks (around 09/03/2016) for infertility.   Loletta Specteroger David Kacyn Souder PA

## 2016-08-12 ENCOUNTER — Ambulatory Visit (HOSPITAL_COMMUNITY)
Admission: RE | Admit: 2016-08-12 | Discharge: 2016-08-12 | Disposition: A | Discharge status: Home / Self Care | Payer: Self-pay | Source: Ambulatory Visit | Attending: Physician Assistant | Admitting: Physician Assistant

## 2016-08-12 DIAGNOSIS — N921 Excessive and frequent menstruation with irregular cycle: Secondary | ICD-10-CM | POA: Insufficient documentation

## 2016-08-13 ENCOUNTER — Telehealth (INDEPENDENT_AMBULATORY_CARE_PROVIDER_SITE_OTHER): Payer: Self-pay

## 2016-08-13 NOTE — Telephone Encounter (Signed)
Left message informing patient that her US was normal and she may begin taking the medication discussed with PCP. And to call the office with any questions. Maryjean Mornempestt S Roberts, CMA

## 2016-09-01 ENCOUNTER — Encounter (HOSPITAL_COMMUNITY): Payer: Self-pay | Admitting: Emergency Medicine

## 2016-09-01 ENCOUNTER — Emergency Department (HOSPITAL_COMMUNITY)
Admission: EM | Admit: 2016-09-01 | Discharge: 2016-09-01 | Disposition: A | Discharge status: Home / Self Care | Payer: Self-pay | Attending: Emergency Medicine | Admitting: Emergency Medicine

## 2016-09-01 DIAGNOSIS — J029 Acute pharyngitis, unspecified: Secondary | ICD-10-CM | POA: Insufficient documentation

## 2016-09-01 DIAGNOSIS — J45909 Unspecified asthma, uncomplicated: Secondary | ICD-10-CM | POA: Insufficient documentation

## 2016-09-01 DIAGNOSIS — Z9109 Other allergy status, other than to drugs and biological substances: Secondary | ICD-10-CM

## 2016-09-01 LAB — RAPID STREP SCREEN (MED CTR MEBANE ONLY): STREPTOCOCCUS, GROUP A SCREEN (DIRECT): NEGATIVE

## 2016-09-01 MED ORDER — CETIRIZINE HCL 10 MG PO TABS: 10.0000 mg | ORAL_TABLET | Freq: Every day | ORAL | 0 refills | Status: DC

## 2016-09-01 MED ORDER — FLUTICASONE PROPIONATE 50 MCG/ACT NA SUSP: 1.0000 | Freq: Every day | NASAL | 1 refills | Status: DC

## 2016-09-01 NOTE — Discharge Instructions (Signed)
As discussed, drink plenty of fluids to keep your urine clear. Use nasal spray and antihistamine as instructed daily.  Tea with honey, cough drops, warm beverages to soothe your throat.  Follow up as needed with your Primary care provider. Return if you have difficulty breathing, facial swelling, fever, chills, can't swallow or other new concerning symptoms in the meantime.

## 2016-09-01 NOTE — ED Triage Notes (Signed)
Pt reports a sore throat for two days, denies Fever, n/v/d, weakness.

## 2016-09-01 NOTE — ED Provider Notes (Signed)
[facility] Provider Note   CSN: [id number] Arrival date & time: 09/01/16  0225     History   Chief Complaint Chief Complaint  Patient presents with  . Sore Throat    HPI Janice White is a 26 y.o. female presenting with 2 days of sore throat. She also reports sneezing postnasal drip and congestion. Denies history of seasonal allergies. She has tried tea with honey and cough drops with mild relief. Denies fever, chills, nausea, vomiting, difficulty swallowing, swelling, shortness of breath or other symptoms.  HPI  Past Medical History:  Diagnosis Date  . Asthma     Patient Active Problem List   Diagnosis Date Noted  . Irregular menstrual cycle 06/09/2016    Past Surgical History:  Procedure Laterality Date  . WISDOM TOOTH EXTRACTION      OB History    Gravida Para Term Preterm AB Living   0 0 0 0 0 0   SAB TAB Ectopic Multiple Live Births   0 0 0 0         Home Medications    Prior to Admission medications   Medication Sig Start Date End Date Taking? Authorizing Provider  cetirizine (ZYRTEC ALLERGY) 10 MG tablet Take 1 tablet (10 mg total) by mouth daily. 09/01/16   Mathews RobinsonsMitchell, Riona Lahti B, PA-C  fluticasone (FLONASE) 50 MCG/ACT nasal spray Place 1 spray into both nostrils daily. 09/01/16   Georgiana ShoreMitchell, Yaminah Clayborn B, PA-C    Family History Family History  Problem Relation Age of Onset  . Diabetes Mother   . Hypertension Mother   . Heart failure Mother   . Heart disease Father   . Stroke Father     Social History Social History  Substance Use Topics  . Smoking status: Never Smoker  . Smokeless tobacco: Never Used  . Alcohol use No     Allergies   Penicillins   Review of Systems Review of Systems  Constitutional: Negative for chills and fever.  HENT: Positive for postnasal drip, rhinorrhea, sneezing and sore throat. Negative for dental problem, drooling, ear pain, facial swelling, trouble swallowing and voice change.   Eyes: Negative for pain  and visual disturbance.  Respiratory: Negative for cough, choking, chest tightness, shortness of breath, wheezing and stridor.   Cardiovascular: Negative for chest pain and palpitations.  Gastrointestinal: Negative for abdominal pain, nausea and vomiting.  Genitourinary: Negative for dysuria and hematuria.  Musculoskeletal: Negative for arthralgias, back pain, myalgias, neck pain and neck stiffness.  Skin: Negative for color change, pallor and rash.  Neurological: Negative for dizziness, seizures, syncope and light-headedness.     Physical Exam Updated Vital Signs BP 120/84   Pulse 79   Temp 99.4 F (37.4 C) (Oral)   Resp 18   SpO2 100%   Physical Exam  Constitutional: She appears well-developed and well-nourished. No distress.  Afebrile, nontoxic-appearing, lying comfortably in bed in no acute distress.  HENT:  Head: Normocephalic and atraumatic.  Mouth/Throat: Oropharynx is clear and moist. No oropharyngeal exudate.  No gross oral abscess. Uvula is midline, arches are simmetrical and intact. No peritonsilar swelling or exudate. No trismus. Tolerating oral secretions.    Eyes: Conjunctivae and EOM are normal. Right eye exhibits no discharge. Left eye exhibits no discharge.  Neck: Normal range of motion. Neck supple.  Cardiovascular: Normal rate, regular rhythm and normal heart sounds.   No murmur heard. Pulmonary/Chest: Effort normal. No stridor. No respiratory distress. She has no wheezes. She has no rales.  Abdominal: She  exhibits no distension.  Musculoskeletal: Normal range of motion. She exhibits no edema or deformity.  Lymphadenopathy:    She has no cervical adenopathy.  Neurological: She is alert.  Skin: Skin is warm and dry. No rash noted. She is not diaphoretic. No erythema. No pallor.  Psychiatric: She has a normal mood and affect.  Nursing note and vitals reviewed.    ED Treatments / Results  Labs (all labs ordered are listed, but only abnormal results are  displayed) Labs Reviewed  RAPID STREP SCREEN (NOT AT [facility])  CULTURE, GROUP A STREP [facility])    EKG  EKG Interpretation None       Radiology No results found.  Procedures Procedures (including critical care time)  Medications Ordered in ED Medications - No data to display   Initial Impression / Assessment and Plan / ED Course  I have reviewed the triage vital signs and the nursing notes.  Pertinent labs & imaging results that were available during my care of the patient were reviewed by me and considered in my medical decision making (see chart for details).    Pt afebrile without tonsillar exudate, negative strep.  No abx indicated.  DC w symptomatic tx for pain  Pt does not appear dehydrated, but did discuss importance of water rehydration. Presentation non concerning for PTA or infxn spread to soft tissue.  No trismus or uvula deviation.  Specific return precautions discussed. Pt able to drink water in ED without difficulty with intact air way. Recommended PCP follow up.  Discharge home with symptomatic relief of allergy symptoms and PCP follow up as needed.  Discussed strict return precautions and advised to return to the emergency department if experiencing any new or worsening symptoms. Instructions were understood and patient agreed with discharge plan.  Final Clinical Impressions(s) / ED Diagnoses   Final diagnoses:  Sore throat  Environmental allergies    New Prescriptions New Prescriptions   CETIRIZINE (ZYRTEC ALLERGY) 10 MG TABLET    Take 1 tablet (10 mg total) by mouth daily.   FLUTICASONE (FLONASE) 50 MCG/ACT NASAL SPRAY    Place 1 spray into both nostrils daily.     Georgiana Shore, PA-C 09/01/16 0458    Ward, Layla Maw, DO 09/01/16 [phone]

## 2016-09-01 NOTE — ED Notes (Signed)
Pt understood dc material. NAD noted. Scripts given at dc 

## 2016-09-03 LAB — CULTURE, GROUP A STREP (THRC)

## 2016-09-08 ENCOUNTER — Encounter (INDEPENDENT_AMBULATORY_CARE_PROVIDER_SITE_OTHER): Payer: Self-pay | Admitting: Physician Assistant

## 2016-09-08 ENCOUNTER — Ambulatory Visit (INDEPENDENT_AMBULATORY_CARE_PROVIDER_SITE_OTHER): Payer: Self-pay | Admitting: Physician Assistant

## 2016-09-08 VITALS — BP 121/79 | HR 78 | Temp 98.4°F | Wt 135.0 lb

## 2016-09-08 DIAGNOSIS — N979 Female infertility, unspecified: Secondary | ICD-10-CM

## 2016-09-08 DIAGNOSIS — N921 Excessive and frequent menstruation with irregular cycle: Secondary | ICD-10-CM

## 2016-09-08 MED ORDER — CLOMIPHENE CITRATE 50 MG PO TABS: 50.0000 mg | ORAL_TABLET | Freq: Every day | ORAL | 0 refills | Status: DC

## 2016-09-08 NOTE — Patient Instructions (Signed)
Clomiphene tablets What is this medicine? CLOMIPHENE (KLOE mi feen) is a fertility drug that increases the chance of pregnancy. It helps women ovulate (produce a mature egg) during their cycle. This medicine may be used for other purposes; ask your health care provider or pharmacist if you have questions. COMMON BRAND NAME(S): Clomid, Serophene What should I tell my health care provider before I take this medicine? They need to know if you have any of these conditions: -adrenal gland disease -blood vessel disease or blood clots -cyst on the ovary -endometriosis -liver disease -ovarian cancer -pituitary gland disease -vaginal bleeding that has not been evaluated -an unusual or allergic reaction to clomiphene, other medicines, foods, dyes, or preservatives -pregnant (should not be used if you are already pregnant) -breast-feeding How should I use this medicine? Take this medicine by mouth with a glass of water. Follow the directions on the prescription label. Take exactly as directed for the exact number of days prescribed. Take your doses at regular intervals. Most women take this medicine for a 5 day period, but the length of treatment may be adjusted. Your doctor will give you a start date for this medication and will give you instructions on proper use. Do not take your medicine more often than directed. Talk to your pediatrician regarding the use of this medicine in children. Special care may be needed. Overdosage: If you think you have taken too much of this medicine contact a poison control center or emergency room at once. NOTE: This medicine is only for you. Do not share this medicine with others. What if I miss a dose? If you miss a dose, take it as soon as you can. If it is almost time for your next dose, take only that dose. Do not take double or extra doses. What may interact with this medicine? -herbal or dietary supplements, like blue cohosh, black cohosh, chasteberry, or  DHEA -prasterone This list may not describe all possible interactions. Give your health care provider a list of all the medicines, herbs, non-prescription drugs, or dietary supplements you use. Also tell them if you smoke, drink alcohol, or use illegal drugs. Some items may interact with your medicine. What should I watch for while using this medicine? Make sure you understand how and when to use this medicine. You need to know when you are ovulating and when to have sexual intercourse. This will increase the chance of a pregnancy. Visit your doctor or health care professional for regular checks on your progress. You may need tests to check the hormone levels in your blood or you may have to use home-urine tests to check for ovulation. Try to keep any appointments. Compared to other fertility treatments, this medicine does not greatly increase your chances of having multiple babies. An increased chance of having twins may occur in roughly 5 out of every 100 women who take this medication. Stop taking this medicine at once and contact your doctor or health care professional if you think you are pregnant. This medicine is not for long-term use. Most women that benefit from this medicine do so within the first three cycles (months). Your doctor or health care professional will monitor your condition. This medicine is usually used for a total of 6 cycles of treatment. You may get drowsy or dizzy. Do not drive, use machinery, or do anything that needs mental alertness until you know how this drug affects you. Do not stand or sit up quickly. This reduces the risk of dizzy or   fainting spells. Drinking alcoholic beverages or smoking tobacco may decrease your chance of becoming pregnant. Limit or stop alcohol and tobacco use during your fertility treatments. What side effects may I notice from receiving this medicine? Side effects that you should report to your doctor or health care professional as soon as  possible: -allergic reactions like skin rash, itching or hives, swelling of the face, lips, or tongue -breathing problems -changes in vision -fluid retention -nausea, vomiting -pelvic pain or bloating -severe abdominal pain -sudden weight gain Side effects that usually do not require medical attention (report to your doctor or health care professional if they continue or are bothersome): -breast discomfort -hot flashes -mild pelvic discomfort -mild nausea This list may not describe all possible side effects. Call your doctor for medical advice about side effects. You may report side effects to FDA at 1-800-FDA-1088. Where should I keep my medicine? Keep out of the reach of children. Store at room temperature between 15 and 30 degrees C (59 and 86 degrees F). Protect from heat, light, and moisture. Throw away any unused medicine after the expiration date. NOTE: This sheet is a summary. It may not cover all possible information. If you have questions about this medicine, talk to your doctor, pharmacist, or health care provider.  2018 Elsevier/Gold Standard (2007-05-16 22:21:06)  

## 2016-09-08 NOTE — Progress Notes (Signed)
Subjective:  Patient ID: Janice White, female    DOB: Nov 02, 1990  Age: 26 y.o. MRN: [id number]  CC: f/u infertility  HPI Janice White is a 26 y.o. female with a PMH of Syphillis and Asthma presents with difficulty conceiving. She was on Depo-Provera for many years and stopped in 2016. Has had an irregular menstrual period since then and is having trouble conceiving. Has been trying for one year now. TSH, FSH, and LH are normal. US pelvic and transvaginal showed no significant abnormalities. Says boyfriend had a normal semen analysis. Wants a medication that will help her conceive. Does not endorse any other symptoms or complaints.      Outpatient Medications Prior to Visit  Medication Sig Dispense Refill  . cetirizine (ZYRTEC ALLERGY) 10 MG tablet Take 1 tablet (10 mg total) by mouth daily. 30 tablet 0  . fluticasone (FLONASE) 50 MCG/ACT nasal spray Place 1 spray into both nostrils daily. 16 g 1   No facility-administered medications prior to visit.      ROS Review of Systems  Constitutional: Negative for chills, fever and malaise/fatigue.  Eyes: Negative for blurred vision.  Respiratory: Negative for shortness of breath.   Cardiovascular: Negative for chest pain and palpitations.  Gastrointestinal: Negative for abdominal pain and nausea.  Genitourinary: Negative for dysuria and hematuria.  Musculoskeletal: Negative for joint pain and myalgias.  Skin: Negative for rash.  Neurological: Negative for tingling and headaches.  Psychiatric/Behavioral: Negative for depression. The patient is not nervous/anxious.     Objective:  BP 121/79 (BP Location: Left Arm, Patient Position: Sitting, Cuff Size: Normal)   Pulse 78   Temp 98.4 F (36.9 C) (Oral)   Wt 135 lb (61.2 kg)   LMP 09/06/2016 (Exact Date)   SpO2 100%   BMI 27.27 kg/m   BP/Weight 09/08/2016 09/01/2016 08/06/2016  Systolic BP 121 120 123  Diastolic BP 79 84 71  Wt. (Lbs) 135 - 133  BMI 27.27 - 26.86       Physical Exam  Constitutional: She is oriented to person, place, and time.  Well developed, well nourished, NAD, polite  HENT:  Head: Normocephalic and atraumatic.  Eyes: No scleral icterus.  Neck: Normal range of motion. Neck supple. No thyromegaly present.  Cardiovascular: Normal rate, regular rhythm and normal heart sounds.   Pulmonary/Chest: Effort normal and breath sounds normal.  Abdominal: Soft. Bowel sounds are normal. There is no tenderness.  Musculoskeletal: She exhibits no edema.  Neurological: She is alert and oriented to person, place, and time.  Skin: Skin is warm and dry. No rash noted. No erythema. No pallor.  Psychiatric: She has a normal mood and affect. Her behavior is normal. Thought content normal.  Vitals reviewed.    Assessment & Plan:   1. Infertility  - clomiPHENE (CLOMID) 50 MG tablet; Take 1 tablet (50 mg total) by mouth daily.  Dispense: 5 tablet; Refill: 0 - Pt currently menstruating. I have advised that she wait five days after menstruation ceases to begin taking her clomiphene. Then she should take clomiphene qday as directed for five days. Then wait at least five days to begin having sex.   2. Metrorrhagia - Normal LH, FSH, TSH, and US pelvic/transvaginal. Will wait and see if able to conceive while on clomiphene.    Meds ordered this encounter  Medications  . clomiPHENE (CLOMID) 50 MG tablet    Sig: Take 1 tablet (50 mg total) by mouth daily.    Dispense:  5 tablet  Refill:  0    Order Specific Question:   Supervising Provider    Answer:   Quentin Angst [[id number]]    Follow-up: Return in about 4 weeks (around 10/06/2016) for f/u infertility.   Loletta Specter PA

## 2016-09-24 ENCOUNTER — Ambulatory Visit (HOSPITAL_COMMUNITY)
Admission: EM | Admit: 2016-09-24 | Discharge: 2016-09-24 | Disposition: A | Discharge status: Home / Self Care | Payer: Self-pay | Attending: Internal Medicine | Admitting: Internal Medicine

## 2016-09-24 ENCOUNTER — Encounter (HOSPITAL_COMMUNITY): Payer: Self-pay | Admitting: Emergency Medicine

## 2016-09-24 DIAGNOSIS — Z79899 Other long term (current) drug therapy: Secondary | ICD-10-CM | POA: Insufficient documentation

## 2016-09-24 DIAGNOSIS — N926 Irregular menstruation, unspecified: Secondary | ICD-10-CM | POA: Insufficient documentation

## 2016-09-24 DIAGNOSIS — R51 Headache: Secondary | ICD-10-CM | POA: Insufficient documentation

## 2016-09-24 DIAGNOSIS — Z823 Family history of stroke: Secondary | ICD-10-CM | POA: Insufficient documentation

## 2016-09-24 DIAGNOSIS — N12 Tubulo-interstitial nephritis, not specified as acute or chronic: Secondary | ICD-10-CM | POA: Insufficient documentation

## 2016-09-24 DIAGNOSIS — Z3202 Encounter for pregnancy test, result negative: Secondary | ICD-10-CM

## 2016-09-24 DIAGNOSIS — J45909 Unspecified asthma, uncomplicated: Secondary | ICD-10-CM | POA: Insufficient documentation

## 2016-09-24 DIAGNOSIS — R350 Frequency of micturition: Secondary | ICD-10-CM

## 2016-09-24 DIAGNOSIS — Z833 Family history of diabetes mellitus: Secondary | ICD-10-CM | POA: Insufficient documentation

## 2016-09-24 DIAGNOSIS — R110 Nausea: Secondary | ICD-10-CM | POA: Insufficient documentation

## 2016-09-24 DIAGNOSIS — Z880 Allergy status to penicillin: Secondary | ICD-10-CM | POA: Insufficient documentation

## 2016-09-24 DIAGNOSIS — Z9889 Other specified postprocedural states: Secondary | ICD-10-CM | POA: Insufficient documentation

## 2016-09-24 DIAGNOSIS — R1030 Lower abdominal pain, unspecified: Secondary | ICD-10-CM | POA: Insufficient documentation

## 2016-09-24 DIAGNOSIS — Z8249 Family history of ischemic heart disease and other diseases of the circulatory system: Secondary | ICD-10-CM | POA: Insufficient documentation

## 2016-09-24 LAB — POCT URINALYSIS DIP (DEVICE)
Bilirubin Urine: NEGATIVE
GLUCOSE, UA: NEGATIVE mg/dL
KETONES UR: 80 mg/dL — AB
NITRITE: NEGATIVE
PROTEIN: 30 mg/dL — AB
Specific Gravity, Urine: 1.02 (ref 1.005–1.030)
Urobilinogen, UA: 1 mg/dL (ref 0.0–1.0)
pH: 6.5 (ref 5.0–8.0)

## 2016-09-24 LAB — POCT PREGNANCY, URINE: PREG TEST UR: NEGATIVE

## 2016-09-24 MED ORDER — CIPROFLOXACIN HCL 500 MG PO TABS: 500.0000 mg | ORAL_TABLET | Freq: Two times a day (BID) | ORAL | 0 refills | Status: AC

## 2016-09-24 NOTE — Discharge Instructions (Signed)
Start ciprofloxacin as directed. Keep hydrated, your urine should be clear/pale yellow in color. Take Tylenol/Motrin for fever. If experiencing worsening of symptoms, follow up at the ED for further evaluation.

## 2016-09-24 NOTE — ED Triage Notes (Signed)
Pt c/o bilateral flank pain onset yesterday with some frequent urination. Has some abdominal cramping. Pt also complains of headache with some dizziness. Patient is in NAD.

## 2016-09-24 NOTE — ED Provider Notes (Signed)
[facility]    CSN: [id number] Arrival date & time: 09/24/16  1732     History   Chief Complaint Chief Complaint  Patient presents with  . Headache  . Flank Pain    HPI Janice White is a 26 y.o. female.   26 year old female comes in for 1 day history of bilateral flank pain and urinary frequency. Did not realize she had a fever, denies chills, night sweats. Denies dysuria, hematuria. Last cycle end of July, not on birth control. Denies vaginal discharge, itching/pain, spotting. She has lower abdominal pain with some nausea, denies vomiting. Last bowel movements a few days ago, without straining, has not had a bowel movement since. Has not taken anything for the pain.       Past Medical History:  Diagnosis Date  . Asthma     Patient Active Problem List   Diagnosis Date Noted  . Irregular menstrual cycle 06/09/2016    Past Surgical History:  Procedure Laterality Date  . WISDOM TOOTH EXTRACTION      OB History    Gravida Para Term Preterm AB Living   0 0 0 0 0 0   SAB TAB Ectopic Multiple Live Births   0 0 0 0         Home Medications    Prior to Admission medications   Medication Sig Start Date End Date Taking? Authorizing Provider  cetirizine (ZYRTEC ALLERGY) 10 MG tablet Take 1 tablet (10 mg total) by mouth daily. 09/01/16   Georgiana ShoreMitchell, Jessica B, PA-C  ciprofloxacin (CIPRO) 500 MG tablet Take 1 tablet (500 mg total) by mouth every 12 (twelve) hours. 09/24/16 10/01/16  Belinda FisherYu, Amy V, PA-C  clomiPHENE (CLOMID) 50 MG tablet Take 1 tablet (50 mg total) by mouth daily. 09/08/16   Loletta SpecterGomez, Roger David, PA-C  fluticasone [facility](FLONASE) 50 MCG/ACT nasal spray Place 1 spray into both nostrils daily. 09/01/16   Georgiana ShoreMitchell, Jessica B, PA-C    Family History Family History  Problem Relation Age of Onset  . Diabetes Mother   . Hypertension Mother   . Heart failure Mother   . Heart disease Father   . Stroke Father     Social History Social History  Substance Use  Topics  . Smoking status: Never Smoker  . Smokeless tobacco: Never Used  . Alcohol use No     Allergies   Penicillins   Review of Systems Review of Systems  Reason unable to perform ROS: as per HPI.     Physical Exam Triage Vital Signs ED Triage Vitals  Enc Vitals Group     BP 09/24/16 1746 125/79     Pulse Rate 09/24/16 1746 (!) 123     Resp 09/24/16 1746 14     Temp 09/24/16 1746 (!) 102.4 F (39.1 C)     Temp Source 09/24/16 1746 Oral     SpO2 09/24/16 1746 99 %     Weight --      Height --      Head Circumference --      Peak Flow --      Pain Score 09/24/16 1748 10     Pain Loc --      Pain Edu? --      Excl. in GC? --    No data found.   Updated Vital Signs BP 125/79 (BP Location: Left Arm)   Pulse (!) 123   Temp (!) 102.4 F (39.1 C) (Oral)   Resp 14   LMP  09/06/2016 (Exact Date)   SpO2 99%   Visual Acuity Right Eye Distance:   Left Eye Distance:   Bilateral Distance:    Right Eye Near:   Left Eye Near:    Bilateral Near:     Physical Exam  Constitutional: She is oriented to person, place, and time. She appears well-developed and well-nourished. No distress.  HENT:  Head: Normocephalic and atraumatic.  Eyes: Pupils are equal, round, and reactive to light. Conjunctivae are normal.  Cardiovascular: Normal rate, regular rhythm and normal heart sounds.  Exam reveals no gallop and no friction rub.   No murmur heard. Pulmonary/Chest: Effort normal and breath sounds normal. She has no wheezes. She has no rales.  Abdominal: Soft. Bowel sounds are normal. She exhibits no mass. There is tenderness (suprapubic). There is CVA tenderness (bilateral). There is no rebound and no guarding.  Neurological: She is alert and oriented to person, place, and time.  Skin: Skin is warm and dry.  Psychiatric: She has a normal mood and affect. Her behavior is normal. Judgment normal.     UC Treatments / Results  Labs (all labs ordered are listed, but only  abnormal results are displayed) Labs Reviewed  POCT URINALYSIS DIP (DEVICE) - Abnormal; Notable for the following:       Result Value   Ketones, ur 80 (*)    Hgb urine dipstick TRACE (*)    Protein, ur 30 (*)    Leukocytes, UA TRACE (*)    All other components within normal limits  POCT PREGNANCY, URINE    EKG  EKG Interpretation None       Radiology No results found.  Procedures Procedures (including critical care time)  Medications Ordered in UC Medications - No data to display   Initial Impression / Assessment and Plan / UC Course  I have reviewed the triage vital signs and the nursing notes.  Pertinent labs & imaging results that were available during my care of the patient were reviewed by me and considered in my medical decision making (see chart for details).     Discussed lab results with patient, although with trace leukocytes, patient with bilateral CVA tenderness, lower abdominal pain, fever, will treat for pyelonephritis. Culture sent, patient will be contacted with any changes needed to her medicine. Discussed with patient, given few day history without bowel movements, constipation to be contributing to abdominal pain. Discussed with patient lower abdominal pain could also be caused by GYN problems, if symptoms do not resolve after treatment, follow-up with GYN for further evaluation of possible causes of lower abdominal pain. Returns precaution given.  Final diagnoses:  Pyelonephritis    New Prescriptions New Prescriptions   CIPROFLOXACIN (CIPRO) 500 MG TABLET    Take 1 tablet (500 mg total) by mouth every 12 (twelve) hours.      Belinda Fisher, PA-C 09/24/16 1819

## 2016-09-27 LAB — URINE CULTURE

## 2016-10-09 ENCOUNTER — Telehealth (INDEPENDENT_AMBULATORY_CARE_PROVIDER_SITE_OTHER): Payer: Self-pay | Admitting: Physician Assistant

## 2016-10-09 NOTE — Telephone Encounter (Signed)
Janice White   Patient wants to talk to you regarding  Her medication clomid  She is 1 week late . Please, call her back  Thank You

## 2016-10-16 ENCOUNTER — Ambulatory Visit (INDEPENDENT_AMBULATORY_CARE_PROVIDER_SITE_OTHER): Payer: Self-pay | Admitting: Physician Assistant

## 2016-10-29 ENCOUNTER — Encounter (INDEPENDENT_AMBULATORY_CARE_PROVIDER_SITE_OTHER): Payer: Self-pay | Admitting: Physician Assistant

## 2016-10-29 ENCOUNTER — Ambulatory Visit (INDEPENDENT_AMBULATORY_CARE_PROVIDER_SITE_OTHER): Payer: Self-pay | Admitting: Physician Assistant

## 2016-10-29 VITALS — BP 122/68 | HR 63 | Temp 98.8°F | Wt 130.0 lb

## 2016-10-29 DIAGNOSIS — Z79899 Other long term (current) drug therapy: Secondary | ICD-10-CM

## 2016-10-29 DIAGNOSIS — N979 Female infertility, unspecified: Secondary | ICD-10-CM

## 2016-10-29 LAB — POCT URINE PREGNANCY: PREG TEST UR: NEGATIVE

## 2016-10-29 MED ORDER — CLOMIPHENE CITRATE 50 MG PO TABS: 100.0000 mg | ORAL_TABLET | Freq: Every day | ORAL | 1 refills | Status: DC

## 2016-10-29 NOTE — Patient Instructions (Signed)
Infertility  Infertility is when you are unable to get pregnant (conceive) after a year of having sex regularly without using birth control. Infertility can also mean that a woman is not able to carry a pregnancy to full term.  Both women and men can have fertility problems.  What causes infertility?  What Causes Infertility in Women?  There are many possible causes of infertility in women. For some women, the cause of infertility is not known (unexplained infertility). Infertility can also be linked to more than one cause. Infertility problems in women can be caused by problems with the menstrual cycle or reproductive organs, certain medical conditions, and factors related to lifestyle and age.   Problems with your menstrual cycle can interfere with your ovaries producing eggs (ovulation). This can make it difficult to get pregnant. This includes having a menstrual cycle that is very long, very short, or irregular.   Problems with reproductive organs can include:  ? An abnormally narrow cervix or a cervix that does not remain closed during a pregnancy.  ? A blockage in your fallopian tubes.  ? An abnormally shaped uterus.  ? Uterine fibroids. This is a tissue mass (tumor) that can develop on your uterus.   Medical conditions that can affect a woman's fertility include:  ? Polycystic ovarian syndrome (PCOS). This is a hormonal disorder that can cause small cysts to grow on your ovaries. This is the most common cause of infertility in women.  ? Endometriosis. This is a condition in which the tissue that lines your uterus (endometrium) grows outside of its normal location.  ? Primary ovary insufficiency. This is when your ovaries stop producing eggs and hormones before the age of 40.  ? Sexually transmitted diseases, such as chlamydia or gonorrhea. These infections can cause scarring in your fallopian tubes. This makes it difficult for eggs to reach your uterus.  ? Autoimmune disorders. These are disorders in which  your immune system attacks normal, healthy cells.  ? Hormone imbalances.   Other factors include:  ? Age. A woman's fertility declines with age, especially after her mid-30s.  ? Being under- or overweight.  ? Drinking too much alcohol.  ? Using drugs.  ? Exercising excessively.  ? Being exposed to environmental toxins, such as radiation, pesticides, and certain chemicals.    What Causes Infertility in Men?  There are many causes of infertility in men. Infertility can be linked to more than one cause. Infertility problems in men can be caused by problems with sperm or the reproductive organs, certain medical conditions, and factors related to lifestyle and age. Some men have unexplained infertility.   Problems with sperm. Infertility can result if there is a problem producing:  ? Enough sperm (low sperm count).  ? Enough normally-shaped sperm (sperm morphology).  ? Sperm that are able to reach the egg (poor motility).   Infertility can also be caused by:  ? A problem with hormones.  ? Enlarged veins (varicoceles), cysts (spermatoceles), or tumors of the testicles.  ? Sexual dysfunction.  ? Injury to the testicles.  ? A birth defect, such as not having the tubes that carry sperm (vas deferens).   Medical conditions that can affect a man's fertility include:  ? Diabetes.  ? Cancer treatments, such as chemotherapy or radiation.  ? Klinefelter syndrome. This is an inherited genetic disorder.  ? Thyroid problems, such as an under- or overactive thyroid.  ? Cystic fibrosis.  ? Sexually transmitted diseases.   Other factors   include:  ? Age. A man's fertility declines with age.  ? Drinking too much alcohol.  ? Using drugs.  ? Being exposed to environmental toxins, such as pesticides and lead.    What are the symptoms of infertility?  Being unable to get pregnant after one year of having regular sex without using birth control is the only sign of infertility.  How is infertility diagnosed?  In order to be diagnosed with  infertility, both partners will have a physical exam. Both partners will also have an extensive medical and sexual history taken. If there is no obvious reason for infertility, additional tests may be done.  What Tests Will Women Have?  Women may first have tests to check whether they are ovulating each month. The tests may include:   Blood tests to check hormone levels.   An ultrasound of the ovaries. This looks for possible problems on or in the ovaries.   Taking a small sample of the tissue that lines the uterus for examination under a microscope (endometrial biopsy).    Women who are ovulating may have additional tests. These may include:   Hysterosalpingography.  ? This is an X-ray of the fallopian tubes and uterus taken after a specific type of dye is injected.  ? This test can show the shape of the uterus and whether the fallopian tubes are open.   Laparoscopy.  ? In this test, a lighted tube (laparoscope) is used to look for problems in the fallopian tubes and other female organs.   Transvaginal ultrasound.  ? This is an imaging test to check for abnormalities of the uterus and ovaries.  ? A health care provider can use this test to count the number of follicles on the ovaries.   Hysteroscopy.  ? This test involves using a lighted tube to examine the cervix and inside the uterus.  ? It is done to find any abnormalities inside the uterus.    What Tests Will Men Have?  Tests for men's infertility includes:   Semen tests to check sperm count, morphology, and motility.   Blood tests to check for hormone levels.   Taking a small sample of tissue from inside a testicle (biopsy). This is examined under a microscope.   Blood tests to check for genetic abnormalities (genetic testing).    How are women treated for infertility?  Treatment depends on the cause of infertility. Most cases of infertility in women are treated with medicine or surgery.   Women may take medicine to:  ? Correct ovulation  problems.  ? Treat other health conditions, such as PCOS.   Surgery may be done to:  ? Repair damage to the ovaries, fallopian tubes, cervix, or uterus.  ? Remove growths from the uterus.  ? Remove scar tissue from the uterus, pelvis, or other female organs.    How are men treated for infertility?  Treatment depends on the cause of infertility. Most cases of infertility in men are treated with medicine or surgery.   Men may take medicine to:  ? Correct hormone problems.  ? Treat other health conditions.  ? Treat sexual dysfunction.   Surgery may be done to:  ? Remove blockages in the reproductive tract.  ? Correct other structural problems of the reproductive tract.    What is assisted reproductive technology?  Assisted reproductive technology (ART) refers to all treatments and procedures that combine eggs and sperm outside the body to try to help a couple conceive. ART is often   combined with fertility drugs to stimulate ovulation. Sometimes ART is done using eggs retrieved from another woman's body (donor eggs) or from previously frozen fertilized eggs (embryos).  There are different types of ART. These include:   Intrauterine insemination (IUI).  ? In this procedure, sperm is placed directly into a woman's uterus with a long, thin tube.  ? This may be most effective for infertility caused by sperm problems, including low sperm count and low motility.  ? Can be used in combination with fertility drugs.   In vitro fertilization (IVF).  ? This is often done when a woman's fallopian tubes are blocked or when a man has low sperm counts.  ? Fertility drugs stimulate the ovaries to produce multiple eggs. Once mature, these eggs are removed from the body and combined with the sperm to be fertilized.  ? These fertilized eggs are then placed in the woman's uterus.    This information is not intended to replace advice given to you by your health care provider. Make sure you discuss any questions you have with your  health care provider.  Document Released: 02/05/2003 Document Revised: 07/05/2015 Document Reviewed: 10/18/2013  Elsevier Interactive Patient Education  2018 [employer]

## 2016-10-29 NOTE — Progress Notes (Signed)
Subjective:  Patient ID: Janice White, female    DOB: 27-Dec-1990  Age: 26 y.o. MRN: [id number]  CC: infertility  HPI  Janice L Neelyis a 26 y.o.femalewith a PMH of Syphillis and Asthma presents on f/u of infertility. She was prescribed Clomiphene 50 mg which reportedly did not work for her. Has taken several at home pregnancy tests and is negative. Denies dyspareunia, vaginal discharge, pelvic pain, abdominal pain, fever, chills, nausea, vomiting, or any side effects from clomiphene. LMP August 29th, Boyfriend had normal semen analysis.      Outpatient Medications Prior to Visit  Medication Sig Dispense Refill  . cetirizine (ZYRTEC ALLERGY) 10 MG tablet Take 1 tablet (10 mg total) by mouth daily. 30 tablet 0  . clomiPHENE (CLOMID) 50 MG tablet Take 1 tablet (50 mg total) by mouth daily. 5 tablet 0  . fluticasone (FLONASE) 50 MCG/ACT nasal spray Place 1 spray into both nostrils daily. 16 g 1   No facility-administered medications prior to visit.      ROS Review of Systems  Constitutional: Negative for chills, fever and malaise/fatigue.  Eyes: Negative for blurred vision.  Respiratory: Negative for shortness of breath.   Cardiovascular: Negative for chest pain and palpitations.  Gastrointestinal: Negative for abdominal pain and nausea.  Genitourinary: Negative for dysuria and hematuria.       Infertility  Musculoskeletal: Negative for joint pain and myalgias.  Skin: Negative for rash.  Neurological: Negative for tingling and headaches.  Psychiatric/Behavioral: Negative for depression. The patient is not nervous/anxious.     Objective:  BP 122/68 (BP Location: Left Arm, Patient Position: Sitting, Cuff Size: Normal)   Pulse 63   Temp 98.8 F (37.1 C) (Oral)   Wt 130 lb (59 kg)   LMP 10/05/2016 (Approximate)   SpO2 100%   BMI 26.26 kg/m   BP/Weight 10/29/2016 09/24/2016 09/08/2016  Systolic BP 122 125 121  Diastolic BP 68 79 79  Wt. (Lbs) 130 - 135  BMI 26.26 -  27.27      Physical Exam  Constitutional: She is oriented to person, place, and time.  Well developed, well nourished, NAD, polite  HENT:  Head: Normocephalic and atraumatic.  Eyes: No scleral icterus.  Neck: Normal range of motion. Neck supple. No thyromegaly present.  Cardiovascular: Normal rate, regular rhythm and normal heart sounds.   Pulmonary/Chest: Effort normal and breath sounds normal.  Abdominal: Soft. Bowel sounds are normal. There is no tenderness.  Musculoskeletal: She exhibits no edema.  Neurological: She is alert and oriented to person, place, and time.  Skin: Skin is warm and dry. No rash noted. No erythema. No pallor.  Psychiatric: She has a normal mood and affect. Her behavior is normal. Thought content normal.  Vitals reviewed.    Assessment & Plan:   1. Infertility, female - POCT urine pregnancy negative in clinic today. - Increase clomiPHENE (CLOMID) 50 MG tablet; Take 2 tablets (100 mg total) by mouth daily.  Dispense: 5 tablet; Refill: 1  2. High risk medication use - POCT urine pregnancy negative in clinic today.   Meds ordered this encounter  Medications  . clomiPHENE (CLOMID) 50 MG tablet    Sig: Take 2 tablets (100 mg total) by mouth daily.    Dispense:  5 tablet    Refill:  1    Order Specific Question:   Supervising Provider    Answer:   Quentin AngstJEGEDE, OLUGBEMIGA E [[id number]]    Follow-up: 8 weeks   Loletta Specteroger David Gomez PA

## 2016-10-29 NOTE — Progress Notes (Signed)
Pt states that she has seen brown blood during menses and is not sure if she should count this as a normal cycle or not

## 2017-02-16 NOTE — L&D Delivery Note (Signed)
Delivery Note Pushed for 2-3 hours and finally was able to deliver.  Fetal heart rate stable throughout.  At 5:34 AM a viable and healthy female was delivered via Vaginal, Spontaneous (Presentation: LOA  ).  APGAR: 2, 7, 9 weight  .   Placenta status: spontaneous and grossly intact with 3 vessel Cord:  with the following complications: .Nuchal cord x 1,  Cord pH: sent (venous)  After delivery of head, shoulders were not forthcoming.  We utilized McRoberts maneuver and I was able to grasp anterior axilla (baby's right arm) and then other axilla and maneuver out of pelvis.  Total time was 30 seconds or less. Baby handed to waiting NICU team.    Anesthesia:  epidural Episiotomy: None Lacerations: 1st degree;Perineal Suture Repair: 3.0 vicryl rapide Est. Blood Loss (mL): 171  Mom to postpartum.  Baby to Couplet care / Skin to Skin.  Wynelle BourgeoisMarie White 01/27/2018, 6:18 AM

## 2017-04-25 ENCOUNTER — Ambulatory Visit (HOSPITAL_COMMUNITY)
Admission: EM | Admit: 2017-04-25 | Discharge: 2017-04-25 | Disposition: A | Discharge status: Home / Self Care | Payer: Self-pay | Attending: Internal Medicine | Admitting: Internal Medicine

## 2017-04-25 ENCOUNTER — Ambulatory Visit (INDEPENDENT_AMBULATORY_CARE_PROVIDER_SITE_OTHER): Payer: Self-pay

## 2017-04-25 ENCOUNTER — Encounter (HOSPITAL_COMMUNITY): Payer: Self-pay | Admitting: Emergency Medicine

## 2017-04-25 DIAGNOSIS — M79641 Pain in right hand: Secondary | ICD-10-CM

## 2017-04-25 DIAGNOSIS — S60221A Contusion of right hand, initial encounter: Secondary | ICD-10-CM

## 2017-04-25 DIAGNOSIS — S100XXA Contusion of throat, initial encounter: Secondary | ICD-10-CM

## 2017-04-25 DIAGNOSIS — S40021A Contusion of right upper arm, initial encounter: Secondary | ICD-10-CM

## 2017-04-25 DIAGNOSIS — S1093XA Contusion of unspecified part of neck, initial encounter: Secondary | ICD-10-CM

## 2017-04-25 MED ORDER — IBUPROFEN 800 MG PO TABS
800.0000 mg | ORAL_TABLET | Freq: Once | ORAL | Status: AC
  Administered 2017-04-25: 800 mg via ORAL

## 2017-04-25 MED ORDER — IBUPROFEN 800 MG PO TABS
ORAL_TABLET | ORAL | Status: AC
  Filled 2017-04-25: qty 1

## 2017-04-25 MED ORDER — IBUPROFEN 800 MG PO TABS: 800.0000 mg | ORAL_TABLET | Freq: Three times a day (TID) | ORAL | 0 refills | Status: DC

## 2017-04-25 NOTE — Discharge Instructions (Signed)
Ice to throat, arms, and right hand. Elevate right hand. Ibuprofen for pain control, every 8 hours as needed. If symptoms worsen or do not improve in the next week to return to be seen or to follow up with PCP.
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
## 2017-05-10 ENCOUNTER — Encounter (HOSPITAL_COMMUNITY): Payer: Self-pay | Admitting: Emergency Medicine

## 2017-05-10 ENCOUNTER — Other Ambulatory Visit: Payer: Self-pay

## 2017-05-10 ENCOUNTER — Ambulatory Visit (HOSPITAL_COMMUNITY)
Admission: EM | Admit: 2017-05-10 | Discharge: 2017-05-10 | Disposition: A | Discharge status: Home / Self Care | Payer: Self-pay | Attending: Physician Assistant | Admitting: Physician Assistant

## 2017-05-10 DIAGNOSIS — R05 Cough: Secondary | ICD-10-CM | POA: Insufficient documentation

## 2017-05-10 DIAGNOSIS — J069 Acute upper respiratory infection, unspecified: Secondary | ICD-10-CM

## 2017-05-10 DIAGNOSIS — J029 Acute pharyngitis, unspecified: Secondary | ICD-10-CM | POA: Insufficient documentation

## 2017-05-10 DIAGNOSIS — M25561 Pain in right knee: Secondary | ICD-10-CM

## 2017-05-10 LAB — POCT RAPID STREP A: STREPTOCOCCUS, GROUP A SCREEN (DIRECT): NEGATIVE

## 2017-05-10 MED ORDER — MELOXICAM 7.5 MG PO TABS: 7.5000 mg | ORAL_TABLET | Freq: Every day | ORAL | 0 refills | Status: DC

## 2017-05-10 MED ORDER — FLUTICASONE PROPIONATE 50 MCG/ACT NA SUSP: 2.0000 | Freq: Every day | NASAL | 0 refills | Status: DC

## 2017-05-10 MED ORDER — IPRATROPIUM BROMIDE 0.06 % NA SOLN: 2.0000 | Freq: Four times a day (QID) | NASAL | 0 refills | Status: DC

## 2017-05-10 MED ORDER — BENZONATATE 100 MG PO CAPS: 100.0000 mg | ORAL_CAPSULE | Freq: Three times a day (TID) | ORAL | 0 refills | Status: DC

## 2017-05-10 NOTE — ED Provider Notes (Signed)
[facility]    CSN: [id number] Arrival date & time: 05/10/17  1940     History   Chief Complaint Chief Complaint  Patient presents with  . Cough  . Knee Pain    HPI PANHIA KARL is a 27 y.o. female.   27 year old female comes in for multiple complaints.  2 day history of URI symptoms. Productive cough, rhinorrhea, sore throat, body aches. Denies nasal congestion. Denies fever, chills, night sweats. Hot tea/lemon with some relief. otc cold medicine without relief. Never smoker. Denies sick contact.  Right knee pain for the past 3-4 days. Denies injury/tramua. States pain with walking/movement. Improved today compared to yesterday. Been taking muscle relaxer and heat compress.       Past Medical History:  Diagnosis Date  . Asthma     Patient Active Problem List   Diagnosis Date Noted  . Irregular menstrual cycle 06/09/2016    Past Surgical History:  Procedure Laterality Date  . WISDOM TOOTH EXTRACTION      OB History    Gravida  0   Para  0   Term  0   Preterm  0   AB  0   Living  0     SAB  0   TAB  0   Ectopic  0   Multiple  0   Live Births               Home Medications    Prior to Admission medications   Medication Sig Start Date End Date Taking? Authorizing Provider  benzonatate (TESSALON) 100 MG capsule Take 1 capsule (100 mg total) by mouth every 8 (eight) hours. 05/10/17   Cathie Hoops, Romone Shaff V, PA-C  fluticasone (FLONASE) 50 MCG/ACT nasal spray Place 2 sprays into both nostrils daily. 05/10/17   Cathie Hoops, Melissaann Dizdarevic V, PA-C  ibuprofen (ADVIL,MOTRIN) 800 MG tablet Take 1 tablet (800 mg total) by mouth 3 (three) times daily. 04/25/17   Linus Mako B, NP  ipratropium (ATROVENT) 0.06 % nasal spray Place 2 sprays into both nostrils 4 (four) times daily. 05/10/17   Cathie Hoops, Tinesha Siegrist V, PA-C  meloxicam (MOBIC) 7.5 MG tablet Take 1 tablet (7.5 mg total) by mouth daily. 05/10/17   Belinda Fisher, PA-C    Family History Family History  Problem Relation Age of  Onset  . Diabetes Mother   . Hypertension Mother   . Heart failure Mother   . Heart disease Father   . Stroke Father     Social History Social History   Tobacco Use  . Smoking status: Never Smoker  . Smokeless tobacco: Never Used  Substance Use Topics  . Alcohol use: No  . Drug use: No     Allergies   Penicillins   Review of Systems Review of Systems  Reason unable to perform ROS: See HPI as above.     Physical Exam Triage Vital Signs ED Triage Vitals  Enc Vitals Group     BP 05/10/17 2045 122/77     Pulse Rate 05/10/17 2045 (!) 104     Resp --      Temp 05/10/17 2045 98.6 F (37 C)     Temp Source 05/10/17 2045 Oral     SpO2 05/10/17 2045 100 %     Weight --      Height --      Head Circumference --      Peak Flow --      Pain Score 05/10/17 2043  7     Pain Loc --      Pain Edu? --      Excl. in GC? --    No data found.  Updated Vital Signs BP 122/77 (BP Location: Left Arm)   Pulse (!) 104   Temp 98.6 F (37 C) (Oral)   LMP 04/19/2017   SpO2 100%    Physical Exam  Constitutional: She is oriented to person, place, and time. She appears well-developed and well-nourished. No distress.  HENT:  Head: Normocephalic and atraumatic.  Right Ear: Tympanic membrane, external ear and ear canal normal. Tympanic membrane is not erythematous and not bulging.  Left Ear: Tympanic membrane, external ear and ear canal normal. Tympanic membrane is not erythematous and not bulging.  Nose: Nose normal. Right sinus exhibits no maxillary sinus tenderness and no frontal sinus tenderness. Left sinus exhibits no maxillary sinus tenderness and no frontal sinus tenderness.  Mouth/Throat: Uvula is midline, oropharynx is clear and moist and mucous membranes are normal.  Eyes: Pupils are equal, round, and reactive to light. Conjunctivae are normal.  Neck: Normal range of motion. Neck supple.  Cardiovascular: Normal rate, regular rhythm and normal heart sounds. Exam reveals no  gallop and no friction rub.  No murmur heard. Pulmonary/Chest: Effort normal and breath sounds normal. She has no decreased breath sounds. She has no wheezes. She has no rhonchi. She has no rales.  Musculoskeletal:  No swelling, erythema, increased warmth, contusion seen. Tenderness to palpation of lateral joint line. Full ROM. Strength normal and equal bilaterally. Sensation intact and equal bilaterally.   Lymphadenopathy:    She has no cervical adenopathy.  Neurological: She is alert and oriented to person, place, and time.  Skin: Skin is warm and dry.  Psychiatric: She has a normal mood and affect. Her behavior is normal. Judgment normal.     UC Treatments / Results  Labs (all labs ordered are listed, but only abnormal results are displayed) Labs Reviewed  CULTURE, GROUP A STREP [facility])  POCT RAPID STREP A    EKG None Radiology No results found.  Procedures Procedures (including critical care time)  Medications Ordered in UC Medications - No data to display   Initial Impression / Assessment and Plan / UC Course  I have reviewed the triage vital signs and the nursing notes.  Pertinent labs & imaging results that were available during my care of the patient were reviewed by me and considered in my medical decision making (see chart for details).    Rapid strep negative. Patient is nontoxic in appearance. Symptomatic treatment as needed. Mobic for knee pain. Ice compress, elevation, knee sleeve during activity.  Return precautions given.    Final Clinical Impressions(s) / UC Diagnoses   Final diagnoses:  Acute pain of right knee  Viral URI    ED Discharge Orders        Ordered    benzonatate (TESSALON) 100 MG capsule  Every 8 hours     05/10/17 2123    ipratropium (ATROVENT) 0.06 % nasal spray  4 times daily     05/10/17 2123    fluticasone (FLONASE) 50 MCG/ACT nasal spray  Daily     05/10/17 2123    meloxicam (MOBIC) 7.5 MG tablet  Daily     05/10/17 2123         Belinda FisherYu, Emilyanne Mcgough V, PA-C 05/10/17 2130

## 2017-05-10 NOTE — Discharge Instructions (Addendum)
Rapid strep negative. Tessalon for cough. Start flonase, atrovent nasal spray for nasal congestion/drainage. You can use over the counter nasal saline rinse such as neti pot for nasal congestion. Keep hydrated, your urine should be clear to pale yellow in color. Tylenol/motrin for fever and pain. Monitor for any worsening of symptoms, chest pain, shortness of breath, wheezing, swelling of the throat, follow up for reevaluation.   For sore throat try using a honey-based tea. Use 3 teaspoons of honey with juice squeezed from half lemon. Place shaved pieces of ginger into 1/2-1 cup of water and warm over stove top. Then mix the ingredients and repeat every 4 hours as needed.  Start mobic for knee pain, do not take ibuprofen/naproxen while on mobic. Ice compress, elevation. Knee sleeve during activity. Follow up with PCP if symptoms not improving.

## 2017-05-10 NOTE — ED Triage Notes (Signed)
C/o cough, sore throat and rhinitis onset 2 days. States having right knee pain, no known injury onset 3-4 days

## 2017-05-13 LAB — CULTURE, GROUP A STREP (THRC)

## 2017-05-27 ENCOUNTER — Other Ambulatory Visit: Payer: Self-pay | Admitting: Student

## 2017-05-27 ENCOUNTER — Emergency Department (HOSPITAL_COMMUNITY): Admission: EM | Admit: 2017-05-27 | Discharge: 2017-05-27 | Discharge status: Against Medical Advice | Payer: Self-pay

## 2017-05-27 ENCOUNTER — Inpatient Hospital Stay (HOSPITAL_COMMUNITY): Payer: Medicaid Other

## 2017-05-27 ENCOUNTER — Encounter (HOSPITAL_COMMUNITY): Payer: Self-pay | Admitting: *Deleted

## 2017-05-27 ENCOUNTER — Inpatient Hospital Stay (HOSPITAL_COMMUNITY)
Admission: AD | Admit: 2017-05-27 | Discharge: 2017-05-27 | Disposition: A | Discharge status: Home / Self Care | Payer: Medicaid Other | Source: Ambulatory Visit | Attending: Obstetrics & Gynecology | Admitting: Obstetrics & Gynecology

## 2017-05-27 DIAGNOSIS — R109 Unspecified abdominal pain: Secondary | ICD-10-CM | POA: Diagnosis present

## 2017-05-27 DIAGNOSIS — Z880 Allergy status to penicillin: Secondary | ICD-10-CM | POA: Diagnosis not present

## 2017-05-27 DIAGNOSIS — O98811 Other maternal infectious and parasitic diseases complicating pregnancy, first trimester: Secondary | ICD-10-CM | POA: Diagnosis not present

## 2017-05-27 DIAGNOSIS — Z3A01 Less than 8 weeks gestation of pregnancy: Secondary | ICD-10-CM | POA: Insufficient documentation

## 2017-05-27 DIAGNOSIS — R1032 Left lower quadrant pain: Secondary | ICD-10-CM | POA: Diagnosis not present

## 2017-05-27 DIAGNOSIS — B373 Candidiasis of vulva and vagina: Secondary | ICD-10-CM | POA: Diagnosis not present

## 2017-05-27 DIAGNOSIS — Z3491 Encounter for supervision of normal pregnancy, unspecified, first trimester: Secondary | ICD-10-CM

## 2017-05-27 DIAGNOSIS — B3731 Acute candidiasis of vulva and vagina: Secondary | ICD-10-CM

## 2017-05-27 DIAGNOSIS — O26899 Other specified pregnancy related conditions, unspecified trimester: Secondary | ICD-10-CM

## 2017-05-27 HISTORY — DX: Nausea with vomiting, unspecified: R11.2

## 2017-05-27 HISTORY — DX: Other specified postprocedural states: Z98.890

## 2017-05-27 LAB — URINALYSIS, ROUTINE W REFLEX MICROSCOPIC
BILIRUBIN URINE: NEGATIVE
Glucose, UA: NEGATIVE mg/dL
Hgb urine dipstick: NEGATIVE
Ketones, ur: NEGATIVE mg/dL
Nitrite: NEGATIVE
PROTEIN: NEGATIVE mg/dL
SPECIFIC GRAVITY, URINE: 1.024 (ref 1.005–1.030)
pH: 6 (ref 5.0–8.0)

## 2017-05-27 LAB — CBC
HCT: 35.9 % — ABNORMAL LOW (ref 36.0–46.0)
Hemoglobin: 12.1 g/dL (ref 12.0–15.0)
MCH: 28 pg (ref 26.0–34.0)
MCHC: 33.7 g/dL (ref 30.0–36.0)
MCV: 83.1 fL (ref 78.0–100.0)
Platelets: 329 10*3/uL (ref 150–400)
RBC: 4.32 MIL/uL (ref 3.87–5.11)
RDW: 13.3 % (ref 11.5–15.5)
WBC: 11.8 10*3/uL — AB (ref 4.0–10.5)

## 2017-05-27 LAB — WET PREP, GENITAL
Clue Cells Wet Prep HPF POC: NONE SEEN
SPERM: NONE SEEN
Trich, Wet Prep: NONE SEEN

## 2017-05-27 LAB — POCT PREGNANCY, URINE: Preg Test, Ur: POSITIVE — AB

## 2017-05-27 LAB — TYPE AND SCREEN
ABO/RH(D): A POS
ANTIBODY SCREEN: NEGATIVE

## 2017-05-27 LAB — HCG, QUANTITATIVE, PREGNANCY: hCG, Beta Chain, Quant, S: 5305 m[IU]/mL — ABNORMAL HIGH (ref <5)

## 2017-05-27 LAB — ABO/RH: ABO/RH(D): A POS

## 2017-05-27 MED ORDER — TERCONAZOLE 0.8 % VA CREA: 1.0000 | TOPICAL_CREAM | Freq: Every day | VAGINAL | 0 refills | Status: DC

## 2017-05-27 NOTE — MAU Note (Signed)
Pt presents to MAU c/o lower left sided abdominal pain. Pt states the pain is 5/10 and is sharp and shooting. Pt reports some white discharge and vaginal itching and thinks she has a yeast infection. Pt denies vaginal bleeding. No further complaints. LMP March 7th 2019. Hx of irregular periods. Pt stopped depo 4 years ago.

## 2017-05-27 NOTE — ED Triage Notes (Signed)
[facility]

## 2017-05-27 NOTE — Discharge Instructions (Signed)
[place] for [place] at [facility]       Phone: [phone]  [facility] [place] at [place] Phone: [place] for [employer] at [place]  Phone: [place] for [place] at [facility]  Phone: [place] for [place] at [facility]  Phone: [facility]       Phone: [phone]  [facility]    Phone: [phone]   [facility] [place])    Phone: [facility]    Phone: [phone]  [facility]    Phone: [place] [facility]  Phone: [phone]  [place]    Phone: [phone]  [facility]   Phone: [phone]  [facility]    Phone: [phone]     First Trimester of Pregnancy The first trimester of pregnancy is from week 1 until the end of week 13 (months 1 through 3). During this time, your baby will begin to develop inside you. At 6-8 weeks, the eyes and face are formed, and the heartbeat can be seen on ultrasound. At the end of 12 weeks, all the baby's organs are formed. Prenatal care is all the medical care you receive before the birth of your baby. Make sure you get good prenatal care and follow all of your doctor's instructions. Follow these instructions at home: Medicines  Take over-the-counter and prescription medicines only as told by your doctor. Some medicines are safe and some medicines are not safe during pregnancy.  Take a prenatal vitamin that contains at least 600 micrograms (mcg) of folic acid.  If you have trouble pooping (constipation), take medicine that will make your stool soft (stool softener) if your doctor approves. Eating and drinking  Eat regular, healthy meals.  Your doctor will tell you  the amount of weight gain that is right for you.  Avoid raw meat and uncooked cheese.  If you feel sick to your stomach (nauseous) or throw up (vomit): ? Eat 4 or 5 small meals a day instead of 3 large meals. ? Try eating a few soda crackers. ? Drink liquids between meals instead of during meals.  To prevent constipation: ? Eat foods that are high in fiber, like fresh fruits and vegetables, whole grains, and beans. ? Drink enough fluids to keep your pee (urine) clear or pale yellow. Activity  Exercise only as told by your doctor. Stop exercising if you have cramps or pain in your lower belly (abdomen) or low back.  Do not exercise if it is too hot, too humid, or if you are in a place of great height (high altitude).  Try to avoid standing for long periods of time. Move your legs often if you must stand in one place for a long time.  Avoid heavy lifting.  Wear low-heeled shoes. Sit and stand up straight.  You can have sex unless your doctor tells you not to. Relieving pain and discomfort  Wear a good support bra if your breasts are sore.  Take warm water baths (sitz baths) to soothe pain or discomfort caused by hemorrhoids. Use hemorrhoid cream if your doctor says it is okay.  Rest with your legs raised if you have leg cramps or low back pain.  If you have puffy, bulging veins (varicose veins) in your legs: ? Wear support hose or compression stockings as  told by your doctor. ? Raise (elevate) your feet for 15 minutes, 3-4 times a day. ? Limit salt in your food. Prenatal care  Schedule your prenatal visits by the twelfth week of pregnancy.  Write down your questions. Take them to your prenatal visits.  Keep all your prenatal visits as told by your doctor. This is important. Safety  Wear your seat belt at all times when driving.  Make a list of emergency phone numbers. The list should include numbers for family, friends, the hospital, and police and fire  departments. General instructions  Ask your doctor for a referral to a local prenatal class. Begin classes no later than at the start of month 6 of your pregnancy.  Ask for help if you need counseling or if you need help with nutrition. Your doctor can give you advice or tell you where to go for help.  Do not use hot tubs, steam rooms, or saunas.  Do not douche or use tampons or scented sanitary pads.  Do not cross your legs for long periods of time.  Avoid all herbs and alcohol. Avoid drugs that are not approved by your doctor.  Do not use any tobacco products, including cigarettes, chewing tobacco, and electronic cigarettes. If you need help quitting, ask your doctor. You may get counseling or other support to help you quit.  Avoid cat litter boxes and soil used by cats. These carry germs that can cause birth defects in the baby and can cause a loss of your baby (miscarriage) or stillbirth.  Visit your dentist. At home, brush your teeth with a soft toothbrush. Be gentle when you floss. Contact a doctor if:  You are dizzy.  You have mild cramps or pressure in your lower belly.  You have a nagging pain in your belly area.  You continue to feel sick to your stomach, you throw up, or you have watery poop (diarrhea).  You have a bad smelling fluid coming from your vagina.  You have pain when you pee (urinate).  You have increased puffiness (swelling) in your face, hands, legs, or ankles. Get help right away if:  You have a fever.  You are leaking fluid from your vagina.  You have spotting or bleeding from your vagina.  You have very bad belly cramping or pain.  You gain or lose weight rapidly.  You throw up blood. It may look like coffee grounds.  You are around people who have [place] measles, fifth disease, or chickenpox.  You have a very bad headache.  You have shortness of breath.  You have any kind of trauma, such as from a fall or a car  accident. Summary  The first trimester of pregnancy is from week 1 until the end of week 13 (months 1 through 3).  To take care of yourself and your unborn baby, you will need to eat healthy meals, take medicines only if your doctor tells you to do so, and do activities that are safe for you and your baby.  Keep all follow-up visits as told by your doctor. This is important as your doctor will have to ensure that your baby is healthy and growing well. This information is not intended to replace advice given to you by your health care provider. Make sure you discuss any questions you have with your health care provider. Document Released: 07/22/2007 Document Revised: 02/11/2016 Document Reviewed: 02/11/2016 Elsevier Interactive Patient Education  2017 [employer] Inc.  Vaginal Yeast infection, Adult Vaginal yeast infection  is a condition that causes soreness, swelling, and redness (inflammation) of the vagina. It also causes vaginal discharge. This is a common condition. Some women get this infection frequently. What are the causes? This condition is caused by a change in the normal balance of the yeast (candida) and bacteria that live in the vagina. This change causes an overgrowth of yeast, which causes the inflammation. What increases the risk? This condition is more likely to develop in:  Women who take antibiotic medicines.  Women who have diabetes.  Women who take birth control pills.  Women who are pregnant.  Women who douche often.  Women who have a weak defense (immune) system.  Women who have been taking steroid medicines for a long time.  Women who frequently wear tight clothing.  What are the signs or symptoms? Symptoms of this condition include:  White, thick vaginal discharge.  Swelling, itching, redness, and irritation of the vagina. The lips of the vagina (vulva) may be affected as well.  Pain or a burning feeling while urinating.  Pain during sex.  How is  this diagnosed? This condition is diagnosed with a medical history and physical exam. This will include a pelvic exam. Your health care provider will examine a sample of your vaginal discharge under a microscope. Your health care provider may send this sample for testing to confirm the diagnosis. How is this treated? This condition is treated with medicine. Medicines may be over-the-counter or prescription. You may be told to use one or more of the following:  Medicine that is taken orally.  Medicine that is applied as a cream.  Medicine that is inserted directly into the vagina (suppository).  Follow these instructions at home:  Take or apply over-the-counter and prescription medicines only as told by your health care provider.  Do not have sex until your health care provider has approved. Tell your sex partner that you have a yeast infection. That person should go to his or her health care provider if he or she develops symptoms.  Do not wear tight clothes, such as pantyhose or tight pants.  Avoid using tampons until your health care provider approves.  Eat more yogurt. This may help to keep your yeast infection from returning.  Try taking a sitz bath to help with discomfort. This is a warm water bath that is taken while you are sitting down. The water should only come up to your hips and should cover your buttocks. Do this 3-4 times per day or as told by your health care provider.  Do not douche.  Wear breathable, cotton underwear.  If you have diabetes, keep your blood sugar levels under control. Contact a health care provider if:  You have a fever.  Your symptoms go away and then return.  Your symptoms do not get better with treatment.  Your symptoms get worse.  You have new symptoms.  You develop blisters in or around your vagina.  You have blood coming from your vagina and it is not your menstrual period.  You develop pain in your abdomen. This information is not  intended to replace advice given to you by your health care provider. Make sure you discuss any questions you have with your health care provider. Document Released: 11/12/2004 Document Revised: 07/17/2015 Document Reviewed: 08/06/2014 Elsevier Interactive Patient Education  2018 [employer]

## 2017-05-27 NOTE — MAU Provider Note (Signed)
History     CSN: [id number]  Arrival date and time: 05/27/17 0421   None     Chief Complaint  Patient presents with  . Abdominal Pain   HPI Janice White is a 27 y.o. female who presents with abdominal cramping and positive HPT. Reports positive pregnancy test at home. Has noticed lower abdominal cramping that is worse in LLQ. Pain is intermittent and shooting. Rates pain 5/10 when it occurs. Nothing makes better or worse. Has not treated symptoms. Endorses clumpy white discharge and vaginal irritation x 4 days. Used OTC yeast medication 2 days ago without improvement. Denies n/v/d, constipation, vaginal bleeding, or dysuria. This is her first pregnancy.   OB History    Gravida  1   Para  0   Term  0   Preterm  0   AB  0   Living  0     SAB  0   TAB  0   Ectopic  0   Multiple  0   Live Births              Past Medical History:  Diagnosis Date  . Asthma   . PONV (postoperative nausea and vomiting)     Past Surgical History:  Procedure Laterality Date  . WISDOM TOOTH EXTRACTION      Family History  Problem Relation Age of Onset  . Diabetes Mother   . Hypertension Mother   . Heart failure Mother   . Multiple sclerosis Mother   . Heart disease Father   . Stroke Father   . Diabetes Maternal Grandmother     Social History   Tobacco Use  . Smoking status: Never Smoker  . Smokeless tobacco: Never Used  Substance Use Topics  . Alcohol use: No  . Drug use: No    Allergies:  Allergies  Allergen Reactions  . Penicillins Itching    Has patient had a PCN reaction causing immediate rash, facial/tongue/throat swelling, SOB or lightheadedness with hypotension: No Has patient had a PCN reaction causing severe rash involving mucus membranes or skin necrosis: No Has patient had a PCN reaction that required hospitalization No Has patient had a PCN reaction occurring within the last 10 years: Yes If all of the above answers are "NO", then may proceed  with Cephalosporin use.     Medications Prior to Admission  Medication Sig Dispense Refill Last Dose  . ibuprofen (ADVIL,MOTRIN) 800 MG tablet Take 1 tablet (800 mg total) by mouth 3 (three) times daily. 21 tablet 0 Past Week at Unknown time  . benzonatate (TESSALON) 100 MG capsule Take 1 capsule (100 mg total) by mouth every 8 (eight) hours. 21 capsule 0   . fluticasone (FLONASE) 50 MCG/ACT nasal spray Place 2 sprays into both nostrils daily. 1 g 0   . ipratropium (ATROVENT) 0.06 % nasal spray Place 2 sprays into both nostrils 4 (four) times daily. 15 mL 0   . meloxicam (MOBIC) 7.5 MG tablet Take 1 tablet (7.5 mg total) by mouth daily. 15 tablet 0     Review of Systems  Constitutional: Negative.   Gastrointestinal: Positive for abdominal pain. Negative for diarrhea, nausea and vomiting.  Genitourinary: Positive for vaginal discharge. Negative for dysuria and vaginal bleeding.   Physical Exam   Blood pressure 134/78, pulse (!) 116, temperature 99.5 F (37.5 C), temperature source Oral, resp. rate 18, height 4\' 11"  (1.499 m), weight 132 lb (59.9 kg), last menstrual period 04/19/2017, SpO2 100 %.  Physical  Exam  Nursing note and vitals reviewed. Constitutional: She is oriented to person, place, and time. She appears well-developed and well-nourished. No distress.  HENT:  Head: Normocephalic and atraumatic.  Eyes: Conjunctivae are normal. Right eye exhibits no discharge. Left eye exhibits no discharge. No scleral icterus.  Neck: Normal range of motion.  Cardiovascular: Normal rate, regular rhythm and normal heart sounds.  No murmur heard. Respiratory: Effort normal and breath sounds normal. No respiratory distress. She has no wheezes.  GI: Soft. Bowel sounds are normal. There is no tenderness.  Genitourinary: Cervix exhibits no motion tenderness and no friability. There is erythema in the vagina. No bleeding in the vagina. Vaginal discharge (moderate amount of clumpy white discharge)  found.  Genitourinary Comments: Cervix closed  Neurological: She is alert and oriented to person, place, and time.  Skin: Skin is warm and dry. She is not diaphoretic.  Psychiatric: She has a normal mood and affect. Her behavior is normal. Judgment and thought content normal.    MAU Course  Procedures Results for orders placed or performed in visit on 05/27/17 (from the past 24 hour(s))  ABO/Rh     Status: None   Collection Time: 05/27/17  2:45 AM  Result Value Ref Range   ABO/RH(D)      A POS Performed at [facility], [address] [place], [place] [address]    [place] Ob Comp Less 14 Wks  Result Date: 05/27/2017 CLINICAL DATA:  Pregnant patient in first-trimester pregnancy with pain. EXAM: OBSTETRIC <14 WK US AND TRANSVAGINAL OB US TECHNIQUE: Both transabdominal and transvaginal ultrasound examinations were performed for complete evaluation of the gestation as well as the maternal uterus, adnexal regions, and pelvic cul-de-sac. Transvaginal technique was performed to assess early pregnancy. COMPARISON:  None. FINDINGS: Intrauterine gestational sac: Single Yolk sac:  Visualized. Embryo:  Not Visualized. Cardiac Activity: Not Visualized. MSD: 5.8 mm   5 w   2 d Subchorionic hemorrhage:  None visualized. Maternal uterus/adnexae: Minimal fluid in the endometrial canal appears separate from the gestational sac and not appear to represent subchorionic hemorrhage. This does not have a well-defined border to suggest a second gestational sac. Both ovaries are visualized with blood flow. Corpus luteal cyst on the left. No pelvic free fluid. IMPRESSION: 1. Intrauterine gestational sac with a yolk sac but no fetal pole or cardiac activity yet visualized. Recommend follow-up quantitative B-HCG levels and follow-up US in 10--14 days to assess viability. This recommendation follows SRU consensus guidelines: Diagnostic Criteria for Nonviable Pregnancy Early in the First Trimester. Malva Limes Engl J Med 2013;  604:5409-81369:1443-51. 2. Minimal fluid in the endometrial canal appears separate from the gestational sac and does not have the appearance subchorionic hemorrhage or a second gestational sac, and is of uncertain significance. Electronically Signed   By: Rubye OaksMelanie  Ehinger M.D.   On: 05/27/2017 05:06   [place] Ob Transvaginal  Result Date: 05/27/2017 CLINICAL DATA:  Pregnant patient in first-trimester pregnancy with pain. EXAM: OBSTETRIC <14 WK US AND TRANSVAGINAL OB US TECHNIQUE: Both transabdominal and transvaginal ultrasound examinations were performed for complete evaluation of the gestation as well as the maternal uterus, adnexal regions, and pelvic cul-de-sac. Transvaginal technique was performed to assess early pregnancy. COMPARISON:  None. FINDINGS: Intrauterine gestational sac: Single Yolk sac:  Visualized. Embryo:  Not Visualized. Cardiac Activity: Not Visualized. MSD: 5.8 mm   5 w   2 d Subchorionic hemorrhage:  None visualized. Maternal uterus/adnexae: Minimal fluid in the endometrial canal appears separate from the gestational sac and  not appear to represent subchorionic hemorrhage. This does not have a well-defined border to suggest a second gestational sac. Both ovaries are visualized with blood flow. Corpus luteal cyst on the left. No pelvic free fluid. IMPRESSION: 1. Intrauterine gestational sac with a yolk sac but no fetal pole or cardiac activity yet visualized. Recommend follow-up quantitative B-HCG levels and follow-up US in 10--14 days to assess viability. This recommendation follows SRU consensus guidelines: Diagnostic Criteria for Nonviable Pregnancy Early in the First Trimester. Malva Limes Med 2013; 161:0960-45. 2. Minimal fluid in the endometrial canal appears separate from the gestational sac and does not have the appearance subchorionic hemorrhage or a second gestational sac, and is of uncertain significance. Electronically Signed   By: Rubye Oaks M.D.   On: 05/27/2017 05:06    MDM +UPT UA,  wet prep, GC/chlamydia, CBC, ABO/Rh, quant hCG, HIV, and [place] today to rule out ectopic pregnancy Ultrasound shows IUGS with yolk sac Wet prep + yeast  Assessment and Plan  A: 1. Normal IUP (intrauterine pregnancy) on prenatal ultrasound, first trimester   2. Abdominal pain affecting pregnancy   3. Vaginal yeast infection    P: Discharge home Rx teraezol Discussed reasons to return to MAU Start prenatal care GC/CT pending   Judeth Horn 05/27/2017, 4:38 AM

## 2017-05-28 LAB — GC/CHLAMYDIA PROBE AMP (~~LOC~~) NOT AT ARMC
CHLAMYDIA, DNA PROBE: NEGATIVE
NEISSERIA GONORRHEA: NEGATIVE

## 2017-06-12 ENCOUNTER — Inpatient Hospital Stay (HOSPITAL_COMMUNITY)
Admission: AD | Admit: 2017-06-12 | Discharge: 2017-06-12 | Disposition: A | Discharge status: Home / Self Care | Payer: Medicaid Other | Source: Ambulatory Visit | Attending: Obstetrics and Gynecology | Admitting: Obstetrics and Gynecology

## 2017-06-12 ENCOUNTER — Encounter (HOSPITAL_COMMUNITY): Payer: Self-pay | Admitting: *Deleted

## 2017-06-12 ENCOUNTER — Inpatient Hospital Stay (HOSPITAL_COMMUNITY): Payer: Medicaid Other

## 2017-06-12 DIAGNOSIS — Z880 Allergy status to penicillin: Secondary | ICD-10-CM | POA: Diagnosis not present

## 2017-06-12 DIAGNOSIS — O26891 Other specified pregnancy related conditions, first trimester: Secondary | ICD-10-CM | POA: Diagnosis not present

## 2017-06-12 DIAGNOSIS — R109 Unspecified abdominal pain: Secondary | ICD-10-CM | POA: Insufficient documentation

## 2017-06-12 DIAGNOSIS — Z3A01 Less than 8 weeks gestation of pregnancy: Secondary | ICD-10-CM | POA: Insufficient documentation

## 2017-06-12 DIAGNOSIS — Z3491 Encounter for supervision of normal pregnancy, unspecified, first trimester: Secondary | ICD-10-CM

## 2017-06-12 LAB — URINALYSIS, ROUTINE W REFLEX MICROSCOPIC
Bacteria, UA: NONE SEEN
Bilirubin Urine: NEGATIVE
Glucose, UA: NEGATIVE mg/dL
Hgb urine dipstick: NEGATIVE
Ketones, ur: 5 mg/dL — AB
Nitrite: NEGATIVE
Protein, ur: NEGATIVE mg/dL
Specific Gravity, Urine: 1.028 (ref 1.005–1.030)
WBC, UA: >50 WBC/hpf — ABNORMAL HIGH (ref 0–5)
pH: 6 (ref 5.0–8.0)

## 2017-06-12 MED ORDER — ACETAMINOPHEN 500 MG PO TABS
1000.0000 mg | ORAL_TABLET | Freq: Once | ORAL | Status: AC
  Administered 2017-06-12: 1000 mg via ORAL
  Filled 2017-06-12: qty 2

## 2017-06-12 NOTE — Discharge Instructions (Signed)

## 2017-06-12 NOTE — MAU Note (Signed)
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 Having some pain in L lower abd and just wanted to be sure everything is ok. Denies vag bleeding or d/c. Pain present for 3-4 days

## 2017-06-12 NOTE — MAU Provider Note (Signed)
Chief Complaint: Abdominal Pain   First Provider Initiated Contact with Patient 06/12/17 0142      SUBJECTIVE HPI: Janice White is a 27 y.o. G1P0000 at [redacted]w[redacted]d by LMP who presents to maternity admissions reporting abdominal pain. She reports abdominal pain specific to her left side. 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 She reports that her abdominal pain has been intermittent for 3-4 days she describes the pain as aching, has taken tylenol 2 days ago for the pain with some relief. She rates the pain 4/10 currently. She denies vaginal bleeding, discharge, or abdominal cramping. She denies vaginal itching/burning, urinary symptoms, h/a, dizziness, n/v, or fever/chills. Patient reports that she is concerned and want to make sure baby is okay.  Past Medical History:  Diagnosis Date  . Asthma   . PONV (postoperative nausea and vomiting)    Past Surgical History:  Procedure Laterality Date  . WISDOM TOOTH EXTRACTION     Social History   Socioeconomic History  . Marital status: Single    Spouse name: Not on file  . Number of children: Not on file  . Years of education: Not on file  . Highest education level: Not on file  Occupational History  . Not on file  Social Needs  . Financial resource strain: Not on file  . Food insecurity:    Worry: Not on file    Inability: Not on file  . Transportation needs:    Medical: Not on file    Non-medical: Not on file  Tobacco Use  . Smoking status: Never Smoker  . Smokeless tobacco: Never Used  Substance and Sexual Activity  . Alcohol use: No  . Drug use: No  . Sexual activity: Yes    Birth control/protection: None  Lifestyle  . Physical activity:    Days per week: Not on file    Minutes per session: Not on file  . Stress: Not on file  Relationships  . Social connections:    Talks on phone: Not on file    Gets together: Not on file   Attends religious service: Not on file    Active member of club or organization: Not on file    Attends meetings of clubs or organizations: Not on file    Relationship status: Not on file  . Intimate partner violence:    Fear of current or ex partner: Not on file    Emotionally abused: Not on file    Physically abused: Not on file    Forced sexual activity: Not on file  Other Topics Concern  . Not on file  Social History Narrative  . Not on file   No current facility-administered medications on file prior to encounter.    Current Outpatient Medications on File Prior to Encounter  Medication Sig Dispense Refill  . benzonatate (TESSALON) 100 MG capsule Take 1 capsule (100 mg total) by mouth every 8 (eight) hours. 21 capsule 0  . fluticasone (FLONASE) 50 MCG/ACT nasal spray Place 2 sprays into both nostrils daily. 1 g 0  . ipratropium (ATROVENT) 0.06 % nasal spray Place 2 sprays into both nostrils 4 (four) times daily. 15 mL 0  . terconazole (TERAZOL 3) 0.8 % vaginal cream Place 1 applicator vaginally at bedtime. 20 g 0   Allergies  Allergen Reactions  . Penicillins Itching    Has patient had a PCN reaction causing  immediate rash, facial/tongue/throat swelling, SOB or lightheadedness with hypotension: No Has patient had a PCN reaction causing severe rash involving mucus membranes or skin necrosis: No Has patient had a PCN reaction that required hospitalization No Has patient had a PCN reaction occurring within the last 10 years: Yes If all of the above answers are "NO", then may proceed with Cephalosporin use.     ROS:  Review of Systems  Constitutional: Negative.   Respiratory: Negative.   Cardiovascular: Negative.   Gastrointestinal: Positive for abdominal pain. Negative for constipation, diarrhea, nausea and vomiting.       Left side   Genitourinary: Negative.   Musculoskeletal: Negative.    I have reviewed patient's Past Medical Hx, Surgical Hx, Family Hx, Social Hx,  medications and allergies.   Physical Exam   Patient Vitals for the past 24 hrs:  BP Temp Pulse Resp Height Weight  06/12/17 0240 110/64 98.4 F (36.9 C) 71 18 - -  06/12/17 0121 109/68 98.3 F (36.8 C) 73 18  (1.499 m) 127 lb (57.6 kg)   Constitutional: Well-developed, well-nourished female in no acute distress.  Cardiovascular: normal rate Respiratory: normal effort GI: Abd soft, non-tender. Pos BS x 4, no tenderness when palpating on left side of abdomen.  MS: Extremities nontender, no edema, normal ROM Neurologic: Alert and oriented x 4.  GU: Neg CVAT.  Bimanual exam: Cervix 0/long/high, firm, anterior, neg CMT, uterus nontender, nonenlarged, adnexa without tenderness, enlargement, or mass   LAB RESULTS Results for orders placed or performed during the hospital encounter of 06/12/17 (from the past 24 hour(s))  Urinalysis, Routine w reflex microscopic     Status: Abnormal   Collection Time: 06/12/17  1:30 AM  Result Value Ref Range   Color, Urine YELLOW YELLOW   APPearance CLEAR CLEAR   Specific Gravity, Urine 1.028 1.005 - 1.030   pH 6.0 5.0 - 8.0   Glucose, UA NEGATIVE NEGATIVE mg/dL   Hgb urine dipstick NEGATIVE NEGATIVE   Bilirubin Urine NEGATIVE NEGATIVE   Ketones, ur 5 (A) NEGATIVE mg/dL   Protein, ur NEGATIVE NEGATIVE mg/dL   Nitrite NEGATIVE NEGATIVE   Leukocytes, UA MODERATE (A) NEGATIVE   RBC / HPF 6-10 0 - 5 RBC/hpf   WBC, UA >50 (H) 0 - 5 WBC/hpf   Bacteria, UA NONE SEEN NONE SEEN   Squamous Epithelial / LPF 0-5 0 - 5   Mucus PRESENT     --/--/A POS, A POS Performed at [facility], [address], [place], [place] [address]  [phone])  IMAGING US Ob Transvaginal  Result Date: 06/12/2017 CLINICAL DATA:  Left lower quadrant pain. Follow-up from 05/27/2017. Estimated gestational age by LMP is 7 weeks 5 days. Quantitative beta HCG is 28,974. EXAM: TRANSVAGINAL OB ULTRASOUND TECHNIQUE: Transvaginal ultrasound was performed for complete  evaluation of the gestation as well as the maternal uterus, adnexal regions, and pelvic cul-de-sac. COMPARISON: 05/27/2017 FINDINGS: Intrauterine gestational sac: A single intrauterine pregnancy is identified. Yolk sac:  Visualized. Embryo:  Visualized. Cardiac Activity: Visualized. Heart Rate: 142 bpm CRL: 13.6 mm   7 w 4 d                  [place] EDC: 01/25/2018 Subchorionic hemorrhage:  None visualized. Maternal uterus/adnexae: The uterus is anteverted. No myometrial mass lesions identified. Both ovaries are visualized and appear normal. No abnormal adnexal masses. No abnormal pelvic fluid collections. IMPRESSION: Single intrauterine pregnancy. Estimated gestational age by crown-rump length is 7 weeks 4 days.  No acute complication demonstrated sonographically. Electronically Signed   By: Burman Nieves M.D.   On: 06/12/2017 02:44   MAU Management/MDM: Orders Placed This Encounter  Procedures  . US OB Transvaginal  . Urinalysis, Routine w reflex microscopic   UA- negative   Meds ordered this encounter  Medications  . acetaminophen (TYLENOL) tablet 1,000 mg    Treatments in MAU included 1,000mg  Tylenol for abdominal pain. Patient reports decreased abdominal pain after treatment. Pt discharged. Pt stable at time of discharge.   Educated and discussed safe medication to take in pregnancy. 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 Discussed letting her OBGYN know if she starts having cramping or bleeding while at work to let us know immediately. Patient verbalizes understanding.   ASSESSMENT 1. Normal IUP (intrauterine pregnancy) on prenatal ultrasound, first trimester   2. Abdominal pain during pregnancy in first trimester   3. [redacted] weeks gestation of pregnancy     PLAN Discharge home Make initial prenatal appointment to start care  Return to MAU as needed for emergencies, vaginal bleeding and or abdominal  cramping    Allergies as of 06/12/2017      Reactions   Penicillins Itching   Has patient had a PCN reaction causing immediate rash, facial/tongue/throat swelling, SOB or lightheadedness with hypotension: No Has patient had a PCN reaction causing severe rash involving mucus membranes or skin necrosis: No Has patient had a PCN reaction that required hospitalization No Has patient had a PCN reaction occurring within the last 10 years: Yes If all of the above answers are "NO", then may proceed with Cephalosporin use.      Medication List    STOP taking these medications   ipratropium 0.06 % nasal spray Commonly known as:  ATROVENT   terconazole 0.8 % vaginal cream Commonly known as:  TERAZOL 3     TAKE these medications   benzonatate 100 MG capsule Commonly known as:  TESSALON Take 1 capsule (100 mg total) by mouth every 8 (eight) hours.   fluticasone 50 MCG/ACT nasal spray Commonly known as:  FLONASE Place 2 sprays into both nostrils daily.      Steward Drone  Certified Nurse-Midwife 06/12/2017  4:46 AM

## 2017-06-12 NOTE — Progress Notes (Signed)
Steward Drone CNM in earlier to discuss test results. Written and verbal d/c instructions given and understanding voiced

## 2017-06-13 LAB — CULTURE, OB URINE: Culture: NO GROWTH

## 2017-06-24 ENCOUNTER — Other Ambulatory Visit (HOSPITAL_COMMUNITY): Payer: Self-pay | Admitting: Nurse Practitioner

## 2017-06-24 DIAGNOSIS — Z369 Encounter for antenatal screening, unspecified: Secondary | ICD-10-CM

## 2017-06-24 LAB — OB RESULTS CONSOLE HIV ANTIBODY (ROUTINE TESTING): HIV: NONREACTIVE

## 2017-06-24 LAB — OB RESULTS CONSOLE GC/CHLAMYDIA
Chlamydia: NEGATIVE
Gonorrhea: NEGATIVE

## 2017-06-24 LAB — OB RESULTS CONSOLE RPR: RPR: NONREACTIVE

## 2017-06-24 LAB — OB RESULTS CONSOLE HEPATITIS B SURFACE ANTIGEN: HEP B S AG: NEGATIVE

## 2017-06-24 LAB — OB RESULTS CONSOLE RUBELLA ANTIBODY, IGM: RUBELLA: IMMUNE

## 2017-07-05 ENCOUNTER — Ambulatory Visit (INDEPENDENT_AMBULATORY_CARE_PROVIDER_SITE_OTHER): Payer: Medicaid Other | Admitting: Advanced Practice Midwife

## 2017-07-05 DIAGNOSIS — Z3400 Encounter for supervision of normal first pregnancy, unspecified trimester: Secondary | ICD-10-CM

## 2017-07-05 LAB — POCT URINALYSIS DIP (DEVICE)
GLUCOSE, UA: NEGATIVE mg/dL
Hgb urine dipstick: NEGATIVE
Ketones, ur: 40 mg/dL — AB
Nitrite: NEGATIVE
PROTEIN: 30 mg/dL — AB
SPECIFIC GRAVITY, URINE: 1.025 (ref 1.005–1.030)
UROBILINOGEN UA: 4 mg/dL — AB (ref 0.0–1.0)
pH: 7 (ref 5.0–8.0)

## 2017-07-05 NOTE — Progress Notes (Signed)
Entered in error.  [facility]  Thressa Sheller 11:15 AM 07/05/17

## 2017-07-05 NOTE — Progress Notes (Signed)
Here for New OB- in conversation verfiying if she had started prenatal care she states she had started at [facility] . States she thought she was coming here for [place]. I explained she has a First Screen scheduled in MFM in June, but she was scheduled today for to start ob care. She states she is already going to [facility] and was told to come her for [place]. I explained [facility] will do her anatomy US at 18-20 wks.  I called [facility] and verified she did have a NEW ob appt. And she missed her first centering appt and her next ob appt is 07/28/17 10:00.

## 2017-07-13 ENCOUNTER — Encounter (HOSPITAL_COMMUNITY): Payer: Self-pay

## 2017-07-20 ENCOUNTER — Encounter (HOSPITAL_COMMUNITY): Payer: Self-pay

## 2017-07-20 ENCOUNTER — Ambulatory Visit (HOSPITAL_COMMUNITY)
Admission: RE | Admit: 2017-07-20 | Discharge: 2017-07-20 | Disposition: A | Discharge status: Home / Self Care | Payer: Medicaid Other | Source: Ambulatory Visit | Attending: Nurse Practitioner | Admitting: Nurse Practitioner

## 2017-07-20 ENCOUNTER — Other Ambulatory Visit (HOSPITAL_COMMUNITY): Payer: Self-pay | Admitting: Nurse Practitioner

## 2017-07-20 DIAGNOSIS — Z3A13 13 weeks gestation of pregnancy: Secondary | ICD-10-CM | POA: Diagnosis not present

## 2017-07-20 DIAGNOSIS — Z369 Encounter for antenatal screening, unspecified: Secondary | ICD-10-CM

## 2017-07-20 DIAGNOSIS — Z3682 Encounter for antenatal screening for nuchal translucency: Secondary | ICD-10-CM

## 2017-07-20 DIAGNOSIS — J45909 Unspecified asthma, uncomplicated: Secondary | ICD-10-CM | POA: Insufficient documentation

## 2017-07-20 DIAGNOSIS — O9989 Other specified diseases and conditions complicating pregnancy, childbirth and the puerperium: Secondary | ICD-10-CM | POA: Insufficient documentation

## 2017-07-21 ENCOUNTER — Other Ambulatory Visit: Payer: Self-pay

## 2017-08-10 ENCOUNTER — Emergency Department (HOSPITAL_COMMUNITY)
Admission: EM | Admit: 2017-08-10 | Discharge: 2017-08-11 | Disposition: A | Discharge status: Home / Self Care | Payer: Medicaid Other | Attending: Emergency Medicine | Admitting: Emergency Medicine

## 2017-08-10 ENCOUNTER — Other Ambulatory Visit: Payer: Self-pay

## 2017-08-10 ENCOUNTER — Encounter (HOSPITAL_COMMUNITY): Payer: Self-pay | Admitting: Emergency Medicine

## 2017-08-10 DIAGNOSIS — B3731 Acute candidiasis of vulva and vagina: Secondary | ICD-10-CM

## 2017-08-10 DIAGNOSIS — J029 Acute pharyngitis, unspecified: Secondary | ICD-10-CM

## 2017-08-10 DIAGNOSIS — B373 Candidiasis of vulva and vagina: Secondary | ICD-10-CM

## 2017-08-10 DIAGNOSIS — J45909 Unspecified asthma, uncomplicated: Secondary | ICD-10-CM | POA: Insufficient documentation

## 2017-08-10 DIAGNOSIS — O3462 Maternal care for abnormality of vagina, second trimester: Secondary | ICD-10-CM | POA: Insufficient documentation

## 2017-08-10 DIAGNOSIS — O99512 Diseases of the respiratory system complicating pregnancy, second trimester: Secondary | ICD-10-CM | POA: Insufficient documentation

## 2017-08-10 DIAGNOSIS — O23592 Infection of other part of genital tract in pregnancy, second trimester: Secondary | ICD-10-CM | POA: Insufficient documentation

## 2017-08-10 DIAGNOSIS — N898 Other specified noninflammatory disorders of vagina: Secondary | ICD-10-CM | POA: Insufficient documentation

## 2017-08-10 NOTE — ED Triage Notes (Addendum)
Pt states she began to have a "weird" feeling in her throat tonight. Does not really hurt and is not scratchy.  Pt also endorses symptoms of a yeast infection and would like to be checked.  Pt is [redacted] weeks pregnant and is up to date on prenatal care.

## 2017-08-11 DIAGNOSIS — O23592 Infection of other part of genital tract in pregnancy, second trimester: Secondary | ICD-10-CM | POA: Diagnosis not present

## 2017-08-11 DIAGNOSIS — J45909 Unspecified asthma, uncomplicated: Secondary | ICD-10-CM | POA: Diagnosis not present

## 2017-08-11 DIAGNOSIS — O99512 Diseases of the respiratory system complicating pregnancy, second trimester: Secondary | ICD-10-CM | POA: Diagnosis not present

## 2017-08-11 DIAGNOSIS — N898 Other specified noninflammatory disorders of vagina: Secondary | ICD-10-CM | POA: Diagnosis not present

## 2017-08-11 DIAGNOSIS — J029 Acute pharyngitis, unspecified: Secondary | ICD-10-CM | POA: Diagnosis not present

## 2017-08-11 DIAGNOSIS — O3462 Maternal care for abnormality of vagina, second trimester: Secondary | ICD-10-CM | POA: Diagnosis present

## 2017-08-11 DIAGNOSIS — B373 Candidiasis of vulva and vagina: Secondary | ICD-10-CM | POA: Diagnosis not present

## 2017-08-11 LAB — GC/CHLAMYDIA PROBE AMP (~~LOC~~) NOT AT ARMC
Chlamydia: NEGATIVE
NEISSERIA GONORRHEA: NEGATIVE

## 2017-08-11 LAB — URINALYSIS, ROUTINE W REFLEX MICROSCOPIC
BILIRUBIN URINE: NEGATIVE
Glucose, UA: NEGATIVE mg/dL
HGB URINE DIPSTICK: NEGATIVE
Ketones, ur: 5 mg/dL — AB
Nitrite: NEGATIVE
PROTEIN: NEGATIVE mg/dL
Specific Gravity, Urine: 1.03 (ref 1.005–1.030)
pH: 5 (ref 5.0–8.0)

## 2017-08-11 LAB — GROUP A STREP BY PCR: Group A Strep by PCR: NOT DETECTED

## 2017-08-11 LAB — WET PREP, GENITAL
Clue Cells Wet Prep HPF POC: NONE SEEN
Sperm: NONE SEEN
Trich, Wet Prep: NONE SEEN

## 2017-08-11 MED ORDER — FLUCONAZOLE 150 MG PO TABS
150.0000 mg | ORAL_TABLET | Freq: Once | ORAL | Status: DC
  Filled 2017-08-11: qty 1

## 2017-08-11 MED ORDER — ONDANSETRON 4 MG PO TBDP
4.0000 mg | ORAL_TABLET | Freq: Once | ORAL | Status: AC
  Administered 2017-08-11: 4 mg via ORAL

## 2017-08-11 MED ORDER — ONDANSETRON 4 MG PO TBDP: 4.0000 mg | ORAL_TABLET | Freq: Three times a day (TID) | ORAL | 0 refills | Status: DC | PRN

## 2017-08-11 MED ORDER — ONDANSETRON 4 MG PO TBDP
8.0000 mg | ORAL_TABLET | Freq: Once | ORAL | Status: DC
  Filled 2017-08-11: qty 2

## 2017-08-11 MED ORDER — CEFTRIAXONE SODIUM 250 MG IJ SOLR
250.0000 mg | Freq: Once | INTRAMUSCULAR | Status: AC
  Administered 2017-08-11: 250 mg via INTRAMUSCULAR
  Filled 2017-08-11: qty 250

## 2017-08-11 MED ORDER — LIDOCAINE HCL (PF) 1 % IJ SOLN
INTRAMUSCULAR | Status: AC
  Administered 2017-08-11: 1 mL
  Filled 2017-08-11: qty 5

## 2017-08-11 MED ORDER — FLUCONAZOLE 150 MG PO TABS
150.0000 mg | ORAL_TABLET | Freq: Once | ORAL | 0 refills | Status: AC
Start: 2017-08-11 — End: 2017-08-11

## 2017-08-11 MED ORDER — AZITHROMYCIN 250 MG PO TABS
1000.0000 mg | ORAL_TABLET | Freq: Once | ORAL | Status: AC
  Administered 2017-08-11: 1000 mg via ORAL
  Filled 2017-08-11: qty 4

## 2017-08-11 NOTE — ED Provider Notes (Signed)
[facility]   CSN: [id number] Arrival date & time: 08/10/17  2342     History   Chief Complaint Chief Complaint  Patient presents with  . Vaginal Itching  . Sore Throat    HPI Janice White is a 27 y.o. female.  Patient who is currently 14-[redacted] weeks pregnant presents with vaginal itching like previous yeast infections. No vaginal bleeding, discharge, pelvic pain or abdominal pain. No urinary symptoms, fever. She is not vomiting. No recent antibiotics. Last sexual encounter was 2 weeks ago, no barrier protection. She also complains of a sore throat. No difficulty swallowing. No congestion or cough.  The history is provided by the patient. No language interpreter was used.    Past Medical History:  Diagnosis Date  . Asthma   . PONV (postoperative nausea and vomiting)     Patient Active Problem List   Diagnosis Date Noted  . Irregular menstrual cycle 06/09/2016    Past Surgical History:  Procedure Laterality Date  . WISDOM TOOTH EXTRACTION       OB History    Gravida  1   Para  0   Term  0   Preterm  0   AB  0   Living  0     SAB  0   TAB  0   Ectopic  0   Multiple  0   Live Births               Home Medications    Prior to Admission medications   Medication Sig Start Date End Date Taking? Authorizing Provider  benzonatate (TESSALON) 100 MG capsule Take 1 capsule (100 mg total) by mouth every 8 (eight) hours. Patient not taking: Reported on 07/20/2017 05/10/17   Belinda FisherYu, Amy V, PA-C  fluconazole (DIFLUCAN) 150 MG tablet Take 1 tablet (150 mg total) by mouth once for 1 dose. Take it in 48 hours as you just received antibiotics. 08/11/17 08/11/17  Elpidio AnisUpstill, Berania Peedin, PA-C  fluticasone (FLONASE) 50 MCG/ACT nasal spray Place 2 sprays into both nostrils daily. Patient not taking: Reported on 07/20/2017 05/10/17   Belinda FisherYu, Amy V, PA-C  Prenatal Vit-Fe Fumarate-FA (PRENATAL VITAMIN PO) Take by mouth.    [provider]    Family History Family History  Problem Relation Age of Onset  . Diabetes Mother   . Hypertension Mother   . Heart failure Mother   . Multiple sclerosis Mother   . Heart disease Father   . Stroke Father   . Diabetes Maternal Grandmother     Social History Social History   Tobacco Use  . Smoking status: Never Smoker  . Smokeless tobacco: Never Used  Substance Use Topics  . Alcohol use: No  . Drug use: No     Allergies   Penicillins   Review of Systems Review of Systems  Constitutional: Negative for chills and fever.  HENT: Positive for sore throat. Negative for congestion and trouble swallowing.   Respiratory: Negative.  Negative for cough and shortness of breath.   Cardiovascular: Negative.  Negative for chest pain.  Gastrointestinal: Negative.  Negative for abdominal pain and vomiting.  Genitourinary: Negative for dysuria, vaginal bleeding and vaginal discharge.       See HPI.  Musculoskeletal: Negative.  Negative for back pain.  Skin: Negative.   Neurological: Negative.      Physical Exam Updated Vital Signs BP 98/64   Pulse 86   Temp 98.3 F (36.8 C) (  Oral)   Resp 18   LMP 04/19/2017   SpO2 99%   Physical Exam  Constitutional: She appears well-developed and well-nourished.  HENT:  Head: Normocephalic.  Mouth/Throat: Uvula is midline, oropharynx is clear and moist and mucous membranes are normal. No oropharyngeal exudate or posterior oropharyngeal erythema.  Neck: Normal range of motion. Neck supple.  Cardiovascular: Normal rate and regular rhythm.  Pulmonary/Chest: Effort normal and breath sounds normal.  Abdominal: Soft. Bowel sounds are normal. There is no tenderness. There is no rebound and no guarding.  Genitourinary:  Genitourinary Comments: "cottage cheese" consistency discharge that is slightly yellowish. There is thick, yellowish cervical discharge present without CMT, adnexal tenderness or mass. No bleeding.     Musculoskeletal: Normal range of motion.  Neurological: She is alert. No cranial nerve deficit.  Skin: Skin is warm and dry. No rash noted.  Psychiatric: She has a normal mood and affect.     ED Treatments / Results  Labs (all labs ordered are listed, but only abnormal results are displayed) Labs Reviewed  WET PREP, GENITAL - Abnormal; Notable for the following components:      Result Value   Yeast Wet Prep HPF POC PRESENT (*)    WBC, Wet Prep HPF POC MANY (*)    All other components within normal limits  URINALYSIS, ROUTINE W REFLEX MICROSCOPIC - Abnormal; Notable for the following components:   APPearance HAZY (*)    Ketones, ur 5 (*)    Leukocytes, UA SMALL (*)    Bacteria, UA RARE (*)    All other components within normal limits  GROUP A STREP BY PCR  GC/CHLAMYDIA PROBE AMP ([place]) NOT AT [facility]    EKG None  Radiology No results found.  Procedures Procedures (including critical care time)  Medications Ordered in ED Medications  cefTRIAXone (ROCEPHIN) injection 250 mg (250 mg Intramuscular Given 08/11/17 0220)  azithromycin (ZITHROMAX) tablet 1,000 mg (1,000 mg Oral Given 08/11/17 0221)  lidocaine (PF) (XYLOCAINE) 1 % injection (1 mL  Given 08/11/17 0237)     Initial Impression / Assessment and Plan / ED Course  I have reviewed the triage vital signs and the nursing notes.  Pertinent labs & imaging results that were available during my care of the patient were reviewed by me and considered in my medical decision making (see chart for details).     Patient presents with complaint of vaginal itching. She denies visualized discharge, pelvic pain, vaginal bleeding or abdominal pain. She is 14-15 weeks into an uncomplicated pregnancy and has started prenatal care. She confirms an ultrasound has been performed, IUP and even sex determined.  She has yeast on her wet prep. Discussed WBC's on Wet prep as well and she elects to have the antibiotic STD coverage. Will  provide Rx for Diflucan 150 mg x 1 to take in 48 hours after antibiotics.   She requests a strep test in eval of sore throat. I have a low suspicion for positive test but will perform the test for her.   Strep negative. She is stable for discharge home.    Final Clinical Impressions(s) / ED Diagnoses   Final diagnoses:  Yeast vaginitis  Pharyngitis, unspecified etiology    ED Discharge Orders        Ordered    fluconazole (DIFLUCAN) 150 MG tablet   Once     08/11/17 0307       Elpidio Anis, PA-C 08/11/17 4098    Derwood Kaplan, MD 08/11/17 0730

## 2017-08-11 NOTE — ED Notes (Signed)
Patient vomiting at this time.

## 2017-08-11 NOTE — ED Notes (Signed)
Pelvic cart at bedside. 

## 2017-09-11 ENCOUNTER — Inpatient Hospital Stay (HOSPITAL_COMMUNITY)
Admission: AD | Admit: 2017-09-11 | Discharge: 2017-09-11 | Disposition: A | Discharge status: Home / Self Care | Payer: Medicaid Other | Source: Ambulatory Visit | Attending: Obstetrics and Gynecology | Admitting: Obstetrics and Gynecology

## 2017-09-11 DIAGNOSIS — B3731 Acute candidiasis of vulva and vagina: Secondary | ICD-10-CM

## 2017-09-11 DIAGNOSIS — O98812 Other maternal infectious and parasitic diseases complicating pregnancy, second trimester: Secondary | ICD-10-CM | POA: Insufficient documentation

## 2017-09-11 DIAGNOSIS — Z8249 Family history of ischemic heart disease and other diseases of the circulatory system: Secondary | ICD-10-CM | POA: Insufficient documentation

## 2017-09-11 DIAGNOSIS — Z8269 Family history of other diseases of the musculoskeletal system and connective tissue: Secondary | ICD-10-CM | POA: Diagnosis not present

## 2017-09-11 DIAGNOSIS — O99512 Diseases of the respiratory system complicating pregnancy, second trimester: Secondary | ICD-10-CM | POA: Insufficient documentation

## 2017-09-11 DIAGNOSIS — Z9889 Other specified postprocedural states: Secondary | ICD-10-CM | POA: Insufficient documentation

## 2017-09-11 DIAGNOSIS — B373 Candidiasis of vulva and vagina: Secondary | ICD-10-CM | POA: Diagnosis present

## 2017-09-11 DIAGNOSIS — Z880 Allergy status to penicillin: Secondary | ICD-10-CM | POA: Diagnosis not present

## 2017-09-11 DIAGNOSIS — Z833 Family history of diabetes mellitus: Secondary | ICD-10-CM | POA: Insufficient documentation

## 2017-09-11 DIAGNOSIS — Z823 Family history of stroke: Secondary | ICD-10-CM | POA: Diagnosis not present

## 2017-09-11 DIAGNOSIS — O9989 Other specified diseases and conditions complicating pregnancy, childbirth and the puerperium: Secondary | ICD-10-CM

## 2017-09-11 DIAGNOSIS — Z3A20 20 weeks gestation of pregnancy: Secondary | ICD-10-CM | POA: Diagnosis not present

## 2017-09-11 DIAGNOSIS — Z79899 Other long term (current) drug therapy: Secondary | ICD-10-CM | POA: Diagnosis not present

## 2017-09-11 LAB — URINALYSIS, ROUTINE W REFLEX MICROSCOPIC
Bilirubin Urine: NEGATIVE
GLUCOSE, UA: NEGATIVE mg/dL
Hgb urine dipstick: NEGATIVE
KETONES UR: 5 mg/dL — AB
Nitrite: NEGATIVE
PH: 7 (ref 5.0–8.0)
Protein, ur: NEGATIVE mg/dL
Specific Gravity, Urine: 1.019 (ref 1.005–1.030)

## 2017-09-11 LAB — WET PREP, GENITAL
CLUE CELLS WET PREP: NONE SEEN
Sperm: NONE SEEN
TRICH WET PREP: NONE SEEN

## 2017-09-11 NOTE — Progress Notes (Signed)
Jennifer Rasch NP in to discuss test results and d/c plan. Written and verbal d/c instructions given and understanding voiced °

## 2017-09-11 NOTE — MAU Provider Note (Signed)
History     CSN: [id number]  Arrival date and time: 09/11/17 2144   First Provider Initiated Contact with Patient 09/11/17 2245      Chief Complaint  Patient presents with  . Vaginal Itching   HPI   Ms.Janice White is a 27 y.o. female G1P0000 @ 638w4d  Here in MAU with complaints of vaginal itching. The itching started 2-3 days ago. She has tried OTC medications which are helping. She also wanted to be tested for STI's tonight to be safe. The itching is constant. No bumps or discharge.   OB History    Gravida  1   Para  0   Term  0   Preterm  0   AB  0   Living  0     SAB  0   TAB  0   Ectopic  0   Multiple  0   Live Births              Past Medical History:  Diagnosis Date  . Asthma   . PONV (postoperative nausea and vomiting)     Past Surgical History:  Procedure Laterality Date  . WISDOM TOOTH EXTRACTION      Family History  Problem Relation Age of Onset  . Diabetes Mother   . Hypertension Mother   . Heart failure Mother   . Multiple sclerosis Mother   . Heart disease Father   . Stroke Father   . Diabetes Maternal Grandmother     Social History   Tobacco Use  . Smoking status: Never Smoker  . Smokeless tobacco: Never Used  Substance Use Topics  . Alcohol use: No  . Drug use: No    Allergies:  Allergies  Allergen Reactions  . Penicillins Itching    Has patient had a PCN reaction causing immediate rash, facial/tongue/throat swelling, SOB or lightheadedness with hypotension: No Has patient had a PCN reaction causing severe rash involving mucus membranes or skin necrosis: No Has patient had a PCN reaction that required hospitalization No Has patient had a PCN reaction occurring within the last 10 years: Yes If all of the above answers are "NO", then may proceed with Cephalosporin use.     Medications Prior to Admission  Medication Sig Dispense Refill Last Dose  . benzonatate (TESSALON) 100 MG capsule Take 1 capsule (100 mg  total) by mouth every 8 (eight) hours. (Patient not taking: Reported on 07/20/2017) 21 capsule 0 Not Taking  . fluticasone (FLONASE) 50 MCG/ACT nasal spray Place 2 sprays into both nostrils daily. (Patient not taking: Reported on 07/20/2017) 1 g 0 Not Taking  . ondansetron (ZOFRAN ODT) 4 MG disintegrating tablet Take 1 tablet (4 mg total) by mouth every 8 (eight) hours as needed for nausea or vomiting. 20 tablet 0   . Prenatal Vit-Fe Fumarate-FA (PRENATAL VITAMIN PO) Take by mouth.   Taking   Results for orders placed or performed during the hospital encounter of 09/11/17 (from the past 48 hour(s))  Urinalysis, Routine w reflex microscopic     Status: Abnormal   Collection Time: 09/11/17  9:57 PM  Result Value Ref Range   Color, Urine YELLOW YELLOW   APPearance HAZY (A) CLEAR   Specific Gravity, Urine 1.019 1.005 - 1.030   pH 7.0 5.0 - 8.0   Glucose, UA NEGATIVE NEGATIVE mg/dL   Hgb urine dipstick NEGATIVE NEGATIVE   Bilirubin Urine NEGATIVE NEGATIVE   Ketones, ur 5 (A) NEGATIVE mg/dL   Protein, ur NEGATIVE  NEGATIVE mg/dL   Nitrite NEGATIVE NEGATIVE   Leukocytes, UA SMALL (A) NEGATIVE   RBC / HPF 0-5 0 - 5 RBC/hpf   WBC, UA 0-5 0 - 5 WBC/hpf   Bacteria, UA RARE (A) NONE SEEN   Squamous Epithelial / LPF 11-20 0 - 5   Mucus PRESENT     Comment: Performed at [facility], [address] [place], [place] [address]  Wet prep, genital     Status: Abnormal   Collection Time: 09/11/17  9:57 PM  Result Value Ref Range   Yeast Wet Prep HPF POC PRESENT (A) NONE SEEN   Trich, Wet Prep NONE SEEN NONE SEEN   Clue Cells Wet Prep HPF POC NONE SEEN NONE SEEN   WBC, Wet Prep HPF POC MODERATE (A) NONE SEEN    Comment: MODERATE BACTERIA SEEN   Sperm NONE SEEN     Comment: Performed at [facility], [address] [place], [place] [address]   Review of Systems  Constitutional: Negative for fever.  Gastrointestinal: Negative for abdominal pain.  Genitourinary: Negative for dysuria and vaginal  discharge.   Physical Exam   Blood pressure 121/61, pulse 88, temperature 98 F (36.7 C), resp. rate 18, height 4\' 11"  (1.499 m), weight 127 lb (57.6 kg), last menstrual period 04/19/2017.  Physical Exam  Constitutional: She is oriented to person, place, and time. She appears well-developed and well-nourished. No distress.  HENT:  Head: Normocephalic.  Eyes: Pupils are equal, round, and reactive to light.  Genitourinary:  Genitourinary Comments: Wet prep and gc collected blindly by the patient  Musculoskeletal: Normal range of motion.  Neurological: She is alert and oriented to person, place, and time.  Skin: Skin is warm. She is not diaphoretic.  Psychiatric: Her behavior is normal.    MAU Course  Procedures None  MDM  + fetal heart tones via doppler Wet prep and GC  Assessment and Plan   A:  1. Yeast vaginitis   2. [redacted] weeks gestation of pregnancy     P:  Discharge home in stable condition Return to MAU for emergencies only Increase oral fluid intake No Rx given, patient has medication at home for yeast  Rasch, Harolyn Rutherford, NP 09/11/2017 10:52 PM

## 2017-09-11 NOTE — Discharge Instructions (Signed)

## 2017-09-11 NOTE — MAU Note (Addendum)
Think I have yeast infection. Vaginal itching for day or two. Vaginal irritation. No vag d/c. Would like to be tested for STIs. No pain but had Trich in past

## 2017-09-11 NOTE — MAU Note (Signed)
Discussed pt and symptoms with J Rasch NP. Will let pt self-swab and wait in lobby for results. PT agrees with plan.

## 2017-09-13 LAB — GC/CHLAMYDIA PROBE AMP (~~LOC~~) NOT AT ARMC
Chlamydia: NEGATIVE
Neisseria Gonorrhea: NEGATIVE

## 2017-09-30 IMAGING — CR DG LUMBAR SPINE COMPLETE 4+V
5 series · 5 of 5 positions shown · non-contrast
Comparison: None.

CLINICAL DATA: Initial evaluation for low back pain for 2-3 weeks.
No injury.

EXAM:
LUMBAR SPINE - COMPLETE 4+ VIEW

[l-spine ap]
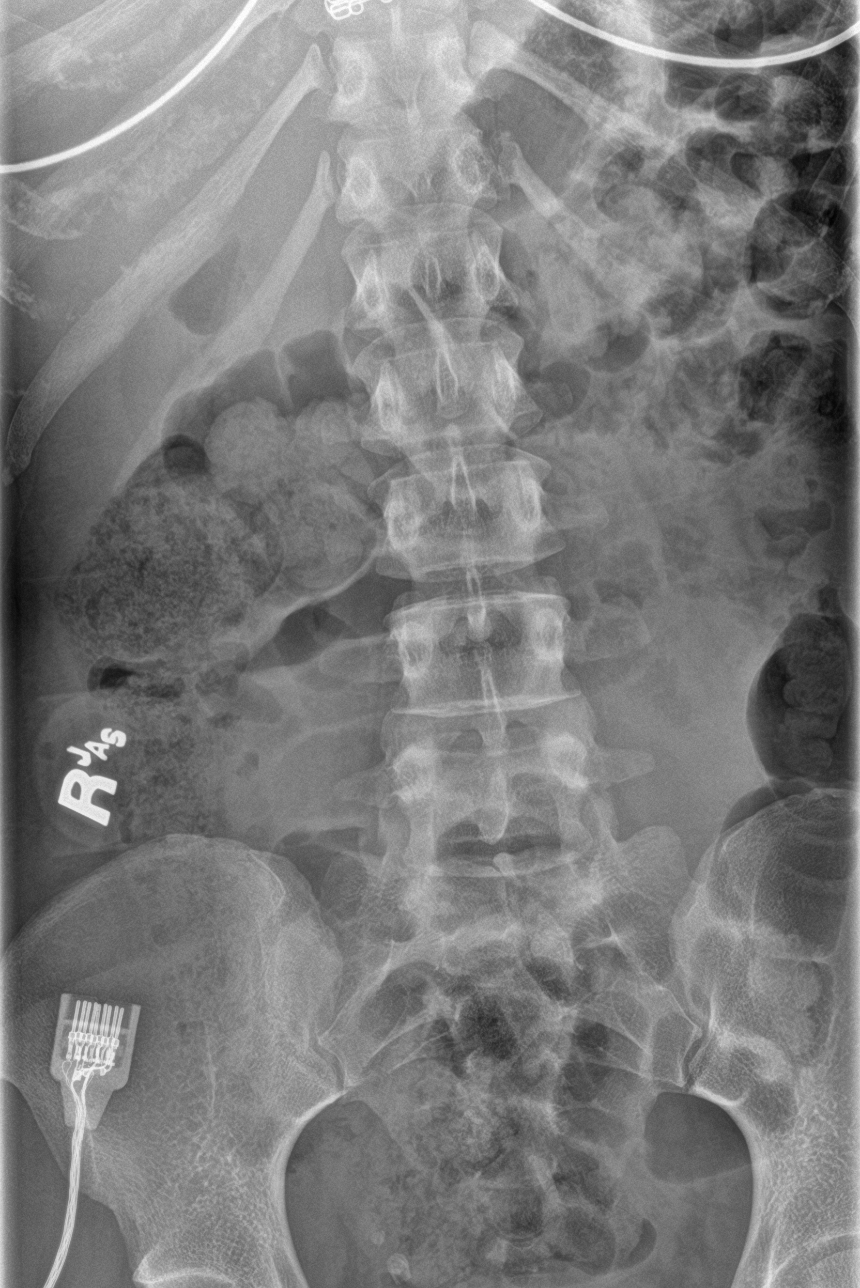

[l-spine obl (1 of 2)]
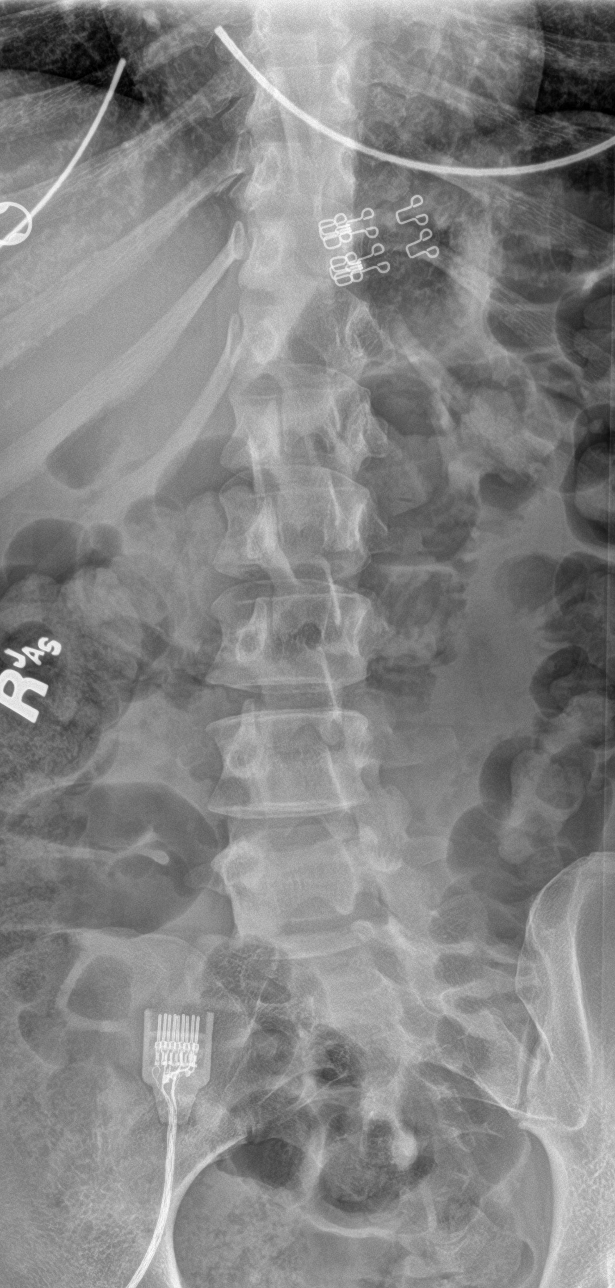

[l-spine obl (2 of 2)]
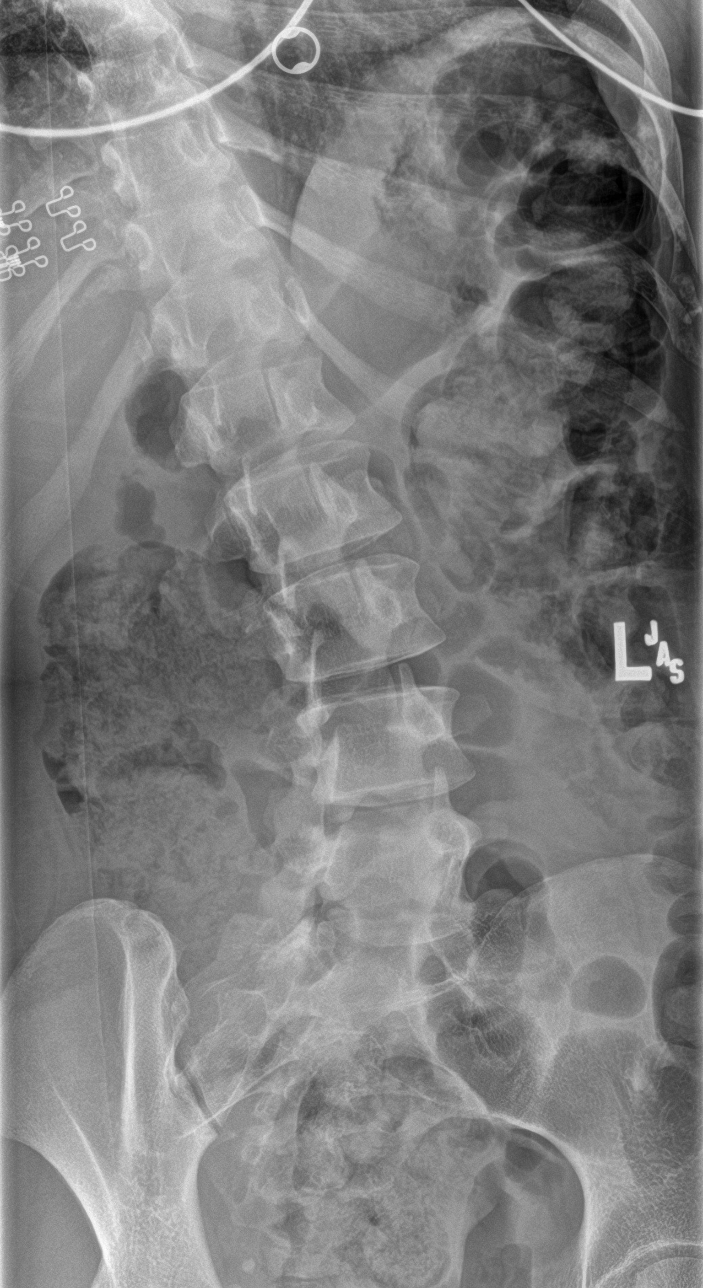

[l-spine lat]
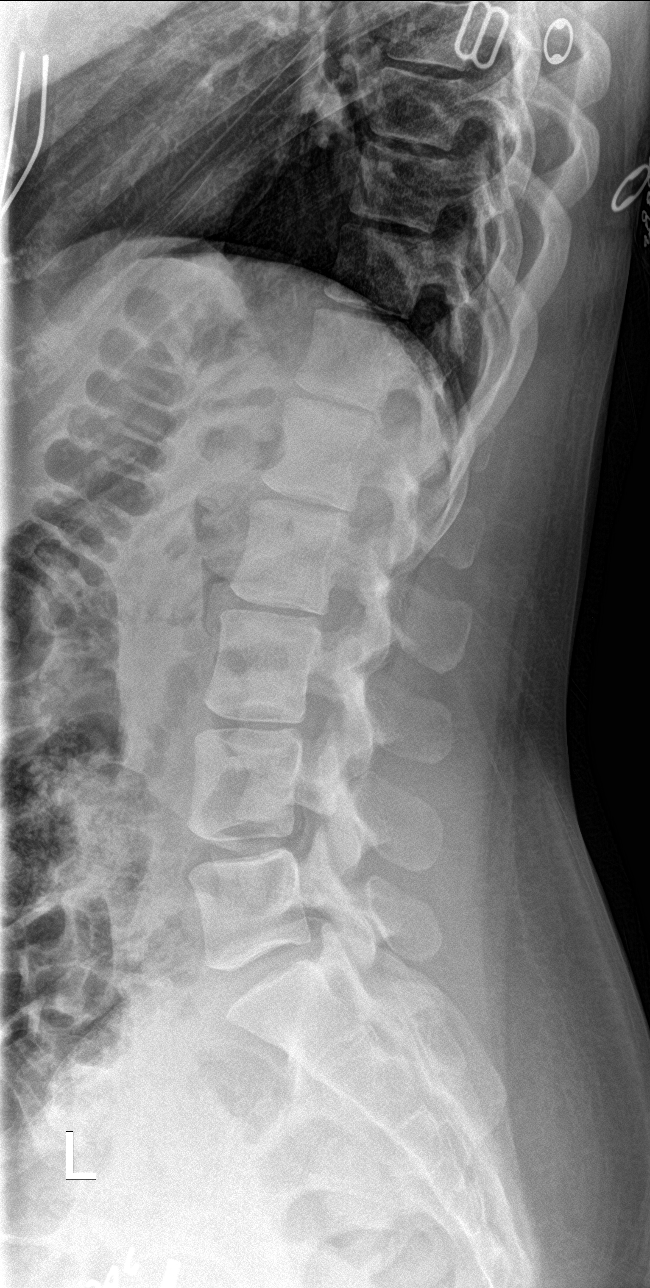

[l-spine spot]
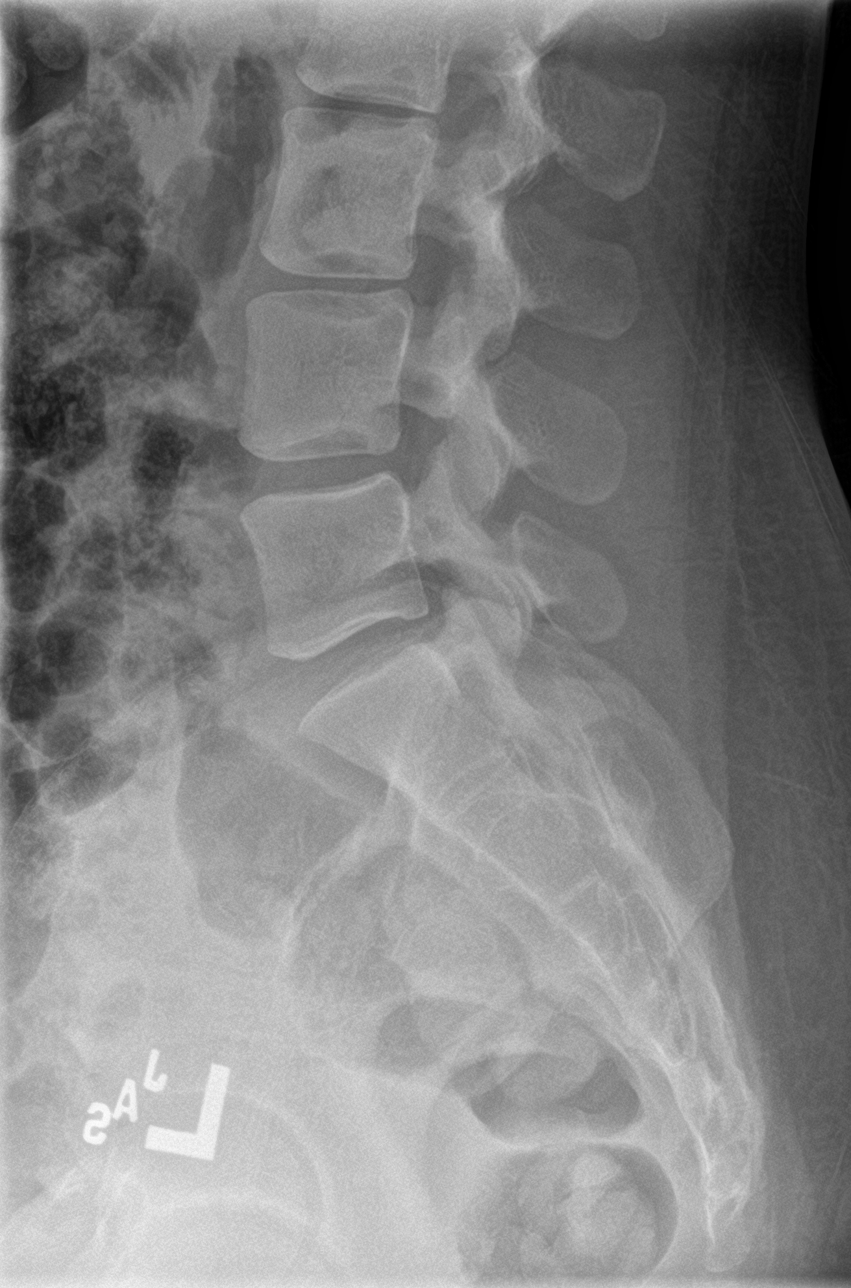

[5 of 5 positions shown; findings below may reference images not displayed]

FINDINGS: There is no evidence of lumbar spine fracture. Alignment is normal.
Intervertebral disc spaces are maintained.
IMPRESSION: Negative.

## 2017-10-05 ENCOUNTER — Ambulatory Visit (HOSPITAL_COMMUNITY)
Admission: EM | Admit: 2017-10-05 | Discharge: 2017-10-05 | Disposition: A | Discharge status: Home / Self Care | Payer: Medicaid Other | Attending: Nurse Practitioner | Admitting: Nurse Practitioner

## 2017-10-05 ENCOUNTER — Other Ambulatory Visit: Payer: Self-pay

## 2017-10-05 ENCOUNTER — Encounter (HOSPITAL_COMMUNITY): Payer: Self-pay

## 2017-10-05 DIAGNOSIS — Z820 Family history of epilepsy and other diseases of the nervous system: Secondary | ICD-10-CM | POA: Insufficient documentation

## 2017-10-05 DIAGNOSIS — Z880 Allergy status to penicillin: Secondary | ICD-10-CM | POA: Diagnosis not present

## 2017-10-05 DIAGNOSIS — O26892 Other specified pregnancy related conditions, second trimester: Secondary | ICD-10-CM | POA: Insufficient documentation

## 2017-10-05 DIAGNOSIS — Z3A25 25 weeks gestation of pregnancy: Secondary | ICD-10-CM | POA: Insufficient documentation

## 2017-10-05 DIAGNOSIS — Z113 Encounter for screening for infections with a predominantly sexual mode of transmission: Secondary | ICD-10-CM | POA: Diagnosis not present

## 2017-10-05 DIAGNOSIS — Z331 Pregnant state, incidental: Secondary | ICD-10-CM | POA: Diagnosis not present

## 2017-10-05 DIAGNOSIS — N898 Other specified noninflammatory disorders of vagina: Secondary | ICD-10-CM | POA: Diagnosis not present

## 2017-10-05 DIAGNOSIS — Z823 Family history of stroke: Secondary | ICD-10-CM | POA: Diagnosis not present

## 2017-10-05 DIAGNOSIS — Z8249 Family history of ischemic heart disease and other diseases of the circulatory system: Secondary | ICD-10-CM | POA: Insufficient documentation

## 2017-10-05 DIAGNOSIS — Z833 Family history of diabetes mellitus: Secondary | ICD-10-CM | POA: Diagnosis not present

## 2017-10-05 DIAGNOSIS — Z3492 Encounter for supervision of normal pregnancy, unspecified, second trimester: Secondary | ICD-10-CM

## 2017-10-05 LAB — POCT URINALYSIS DIP (DEVICE)
BILIRUBIN URINE: NEGATIVE
GLUCOSE, UA: NEGATIVE mg/dL
HGB URINE DIPSTICK: NEGATIVE
LEUKOCYTES UA: NEGATIVE
Nitrite: NEGATIVE
Protein, ur: NEGATIVE mg/dL
SPECIFIC GRAVITY, URINE: 1.02 (ref 1.005–1.030)
UROBILINOGEN UA: 0.2 mg/dL (ref 0.0–1.0)
pH: 6.5 (ref 5.0–8.0)

## 2017-10-05 NOTE — Discharge Instructions (Signed)
Your urine test did not show any signs of infection. The swab will be sent out for further testing. Results should be available within the next couple of days. We will call you with the results when they become available. Drink plenty of fluids and continue your prenatal vitamins. Follow-up with your OB-GYN as scheduled or sooner if needed.

## 2017-10-05 NOTE — ED Provider Notes (Signed)
[facility]    CSN: [id number] Arrival date & time: 10/05/17  1434     History   Chief Complaint Chief Complaint  Patient presents with  . Vaginitis    HPI Janice White is a 27 y.o. female.   Subjective:   Janice White is a 27 y.o. female who presents for evaluation of vaginal irritation. Symptoms have been present for the past several days. She has had several evaluations over the past couple of months for the same. She denies any bleeding, blisters, bumps, burning, vaginal discharge, lesions, odor, pain, chills, dysuria, lower abdominal pain, urinary frequency, urinary urgency, vomiting, nausea, vulvar itching or warts. The patient is currently about [redacted] weeks pregnant, G1P0. She is concerned about possible STD exposure as she suspects that her boyfriend has been sexually unfaithful. She denies multiple sexual partners. She denies any barrier protection use. Patient has been using over the counter yeast cream with minimal relief in irritation.   The following portions of the patient's history were reviewed and updated as appropriate: allergies, current medications, past family history, past medical history, past social history, past surgical history and problem list.           Past Medical History:  Diagnosis Date  . Asthma   . PONV (postoperative nausea and vomiting)     Patient Active Problem List   Diagnosis Date Noted  . Irregular menstrual cycle 06/09/2016    Past Surgical History:  Procedure Laterality Date  . WISDOM TOOTH EXTRACTION      OB History    Gravida  1   Para  0   Term  0   Preterm  0   AB  0   Living  0     SAB  0   TAB  0   Ectopic  0   Multiple  0   Live Births               Home Medications    Prior to Admission medications   Medication Sig Start Date End Date Taking? Authorizing Provider  ondansetron (ZOFRAN ODT) 4 MG disintegrating tablet Take 1 tablet (4 mg total) by mouth every 8 (eight)  hours as needed for nausea or vomiting. Patient not taking: Reported on 10/05/2017 08/11/17   Elpidio AnisUpstill, Shari, PA-C  Prenatal Vit-Fe Fumarate-FA (PRENATAL VITAMIN PO) Take by mouth.    [provider]    Family History Family History  Problem Relation Age of Onset  . Diabetes Mother   . Hypertension Mother   . Heart failure Mother   . Multiple sclerosis Mother   . Heart disease Father   . Stroke Father   . Diabetes Maternal Grandmother     Social History Social History   Tobacco Use  . Smoking status: Never Smoker  . Smokeless tobacco: Never Used  Substance Use Topics  . Alcohol use: No  . Drug use: No     Allergies   Penicillins   Review of Systems Review of Systems  Gastrointestinal: Negative for abdominal pain, nausea and vomiting.  Genitourinary: Negative for difficulty urinating, dysuria, flank pain, frequency, genital sores, pelvic pain, urgency, vaginal bleeding, vaginal discharge and vaginal pain.  Musculoskeletal: Negative for back pain.  All other systems reviewed and are negative.    Physical Exam Triage Vital Signs ED Triage Vitals  Enc Vitals Group     BP 10/05/17 1448 110/68     Pulse Rate 10/05/17 1448 (!) 104  Resp 10/05/17 1448 16     Temp 10/05/17 1448 98.6 F (37 C)     Temp src --      SpO2 10/05/17 1448 100 %     Weight 10/05/17 1447 130 lb (59 kg)     Height --      Head Circumference --      Peak Flow --      Pain Score 10/05/17 1447 0     Pain Loc --      Pain Edu? --      Excl. in GC? --    No data found.  Updated Vital Signs BP 110/68   Pulse (!) 104   Temp 98.6 F (37 C)   Resp 16   Wt 130 lb (59 kg)   LMP 04/19/2017   SpO2 100%   BMI 26.26 kg/m   Visual Acuity Right Eye Distance:   Left Eye Distance:   Bilateral Distance:    Right Eye Near:   Left Eye Near:    Bilateral Near:     Physical Exam  Constitutional: She is oriented to person, place, and time. She appears well-developed and  well-nourished.  Neck: Normal range of motion.  Cardiovascular: Normal rate and regular rhythm.  Pulmonary/Chest: Effort normal.  Musculoskeletal: Normal range of motion.  Neurological: She is alert and oriented to person, place, and time.  Skin: Skin is warm and dry.  Psychiatric: She has a normal mood and affect.     UC Treatments / Results  Labs (all labs ordered are listed, but only abnormal results are displayed) Labs Reviewed  POCT URINALYSIS DIP (DEVICE) - Abnormal; Notable for the following components:      Result Value   Ketones, ur TRACE (*)    All other components within normal limits  CERVICOVAGINAL ANCILLARY ONLY    EKG None  Radiology No results found.  Procedures Procedures (including critical care time)  Medications Ordered in UC Medications - No data to display  Initial Impression / Assessment and Plan / UC Course  I have reviewed the triage vital signs and the nursing notes.  Pertinent labs & imaging results that were available during my care of the patient were reviewed by me and considered in my medical decision making (see chart for details).    27 yo G1P0 female that presents with vaginal irritation. She denies any other symptoms. She has been evaluated several times over the past couple of months for the same. Urine dipstick negative for glucose, negative for hemoglobin, trace for ketones, negative for leukocyte esterase, negative for nitrites, negative for protein and negative for urobilinogen.  . Patient performed self swab to test for GC/Chlamydia, trichomonas, yeast and BV. Results currently pending. We will notify patient of results. Patient encouraged to adhere to safe sex practices. She is followed by [facility] and reports having an appointment with them later this month. Will defer any treatment at this time and await results of swab. Patient advised to follow-up with OB-GYN at scheduled date or sooner if needed. Discussed  indications for immediate  follow-up.    Today's evaluation has revealed no signs of a dangerous process. Discussed diagnosis with patient. Patient aware of their diagnosis, possible red flag symptoms to watch out for and need for close follow up. Patient understands verbal and written discharge instructions. Patient comfortable with plan and disposition.  Patient has a clear mental status at this time, good insight into illness (after discussion and teaching) and has clear  judgment to make decisions regarding their care.  Documentation was completed with the aid of voice recognition software. Transcription may contain typographical errors. Final Clinical Impressions(s) / UC Diagnoses   Final diagnoses:  Vaginal irritation  Second trimester pregnancy     Discharge Instructions     Your urine test did not show any signs of infection. The swab will be sent out for further testing. Results should be available within the next couple of days. We will call you with the results when they become available. Drink plenty of fluids and continue your prenatal vitamins. Follow-up with your OB-GYN as scheduled or sooner if needed.     ED Prescriptions    None     Controlled Substance Prescriptions [place] Controlled Substance Registry consulted? Not Applicable   Lurline IdolMurrill, Laakea Pereira, [place]NP 10/05/17 [phone]

## 2017-10-05 NOTE — ED Triage Notes (Signed)
Vaginal itching x 3days. Pt is [redacted] weeks pregnant.

## 2017-10-06 LAB — CERVICOVAGINAL ANCILLARY ONLY
BACTERIAL VAGINITIS: NEGATIVE
Candida vaginitis: POSITIVE — AB
Chlamydia: NEGATIVE
Neisseria Gonorrhea: NEGATIVE
TRICH (WINDOWPATH): NEGATIVE

## 2017-10-07 ENCOUNTER — Telehealth (HOSPITAL_COMMUNITY): Payer: Self-pay

## 2017-10-07 NOTE — Telephone Encounter (Signed)
Please disregard previous note, patient is pregnant and cannot have diflucan.  Offered the patient cream and she requested to just call her obgyn for treatment.

## 2017-10-12 ENCOUNTER — Inpatient Hospital Stay (HOSPITAL_COMMUNITY)
Admission: AD | Admit: 2017-10-12 | Discharge: 2017-10-13 | Disposition: A | Discharge status: Other Acute Inpatient Hospital | Payer: Medicaid Other | Source: Ambulatory Visit | Attending: Obstetrics & Gynecology | Admitting: Obstetrics & Gynecology

## 2017-10-12 ENCOUNTER — Other Ambulatory Visit: Payer: Self-pay

## 2017-10-12 ENCOUNTER — Encounter (HOSPITAL_COMMUNITY): Payer: Self-pay | Admitting: Emergency Medicine

## 2017-10-12 DIAGNOSIS — Y9389 Activity, other specified: Secondary | ICD-10-CM | POA: Insufficient documentation

## 2017-10-12 DIAGNOSIS — Z79899 Other long term (current) drug therapy: Secondary | ICD-10-CM | POA: Diagnosis not present

## 2017-10-12 DIAGNOSIS — I4949 Other premature depolarization: Secondary | ICD-10-CM | POA: Diagnosis not present

## 2017-10-12 DIAGNOSIS — F419 Anxiety disorder, unspecified: Secondary | ICD-10-CM | POA: Diagnosis not present

## 2017-10-12 DIAGNOSIS — J45909 Unspecified asthma, uncomplicated: Secondary | ICD-10-CM | POA: Insufficient documentation

## 2017-10-12 DIAGNOSIS — R1032 Left lower quadrant pain: Secondary | ICD-10-CM | POA: Diagnosis not present

## 2017-10-12 DIAGNOSIS — Z3A25 25 weeks gestation of pregnancy: Secondary | ICD-10-CM | POA: Diagnosis not present

## 2017-10-12 DIAGNOSIS — Y92410 Unspecified street and highway as the place of occurrence of the external cause: Secondary | ICD-10-CM | POA: Diagnosis not present

## 2017-10-12 DIAGNOSIS — Y999 Unspecified external cause status: Secondary | ICD-10-CM | POA: Insufficient documentation

## 2017-10-12 DIAGNOSIS — O26892 Other specified pregnancy related conditions, second trimester: Secondary | ICD-10-CM | POA: Diagnosis not present

## 2017-10-12 DIAGNOSIS — T1490XD Injury, unspecified, subsequent encounter: Secondary | ICD-10-CM | POA: Diagnosis not present

## 2017-10-12 LAB — CBC WITH DIFFERENTIAL/PLATELET
Abs Immature Granulocytes: 0.1 10*3/uL (ref 0.0–0.1)
Basophils Absolute: 0 10*3/uL (ref 0.0–0.1)
Basophils Relative: 0 %
EOS ABS: 0 10*3/uL (ref 0.0–0.7)
EOS PCT: 0 %
HEMATOCRIT: 33.2 % — AB (ref 36.0–46.0)
HEMOGLOBIN: 10.4 g/dL — AB (ref 12.0–15.0)
Immature Granulocytes: 0 %
LYMPHS ABS: 1.7 10*3/uL (ref 0.7–4.0)
LYMPHS PCT: 15 %
MCH: 27.4 pg (ref 26.0–34.0)
MCHC: 31.3 g/dL (ref 30.0–36.0)
MCV: 87.6 fL (ref 78.0–100.0)
Monocytes Absolute: 1 10*3/uL (ref 0.1–1.0)
Monocytes Relative: 8 %
Neutro Abs: 8.9 10*3/uL — ABNORMAL HIGH (ref 1.7–7.7)
Neutrophils Relative %: 77 %
Platelets: 210 10*3/uL (ref 150–400)
RBC: 3.79 MIL/uL — AB (ref 3.87–5.11)
RDW: 13.2 % (ref 11.5–15.5)
WBC: 11.7 10*3/uL — AB (ref 4.0–10.5)

## 2017-10-12 LAB — RH IG WORKUP (INCLUDES ABO/RH)
ABO/RH(D): A POS
Gestational Age(Wks): 25

## 2017-10-12 LAB — URINALYSIS, ROUTINE W REFLEX MICROSCOPIC
Bilirubin Urine: NEGATIVE
Glucose, UA: NEGATIVE mg/dL
Hgb urine dipstick: NEGATIVE
Ketones, ur: 80 mg/dL — AB
Nitrite: POSITIVE — AB
Protein, ur: 30 mg/dL — AB
Specific Gravity, Urine: 1.018 (ref 1.005–1.030)
pH: 6 (ref 5.0–8.0)

## 2017-10-12 LAB — COMPREHENSIVE METABOLIC PANEL
ALBUMIN: 3.4 g/dL — AB (ref 3.5–5.0)
ALK PHOS: 81 U/L (ref 38–126)
ALT: 16 U/L (ref 0–44)
ANION GAP: 10 (ref 5–15)
AST: 23 U/L (ref 15–41)
BILIRUBIN TOTAL: 0.7 mg/dL (ref 0.3–1.2)
BUN: <5 mg/dL — ABNORMAL LOW (ref 6–20)
CALCIUM: 9.3 mg/dL (ref 8.9–10.3)
CO2: 23 mmol/L (ref 22–32)
Chloride: 105 mmol/L (ref 98–111)
Creatinine, Ser: 0.49 mg/dL (ref 0.44–1.00)
GLUCOSE: 75 mg/dL (ref 70–99)
POTASSIUM: 3.7 mmol/L (ref 3.5–5.1)
Sodium: 138 mmol/L (ref 135–145)
TOTAL PROTEIN: 6.5 g/dL (ref 6.5–8.1)

## 2017-10-12 MED ORDER — SODIUM CHLORIDE 0.9 % IV SOLN
1.0000 g | INTRAVENOUS | Status: DC
  Administered 2017-10-12: 1 g via INTRAVENOUS
  Filled 2017-10-12: qty 10

## 2017-10-12 MED ORDER — CEFTRIAXONE SODIUM 1 G IJ SOLR
1.0000 g | Freq: Once | INTRAMUSCULAR | Status: DC
Start: 2017-10-12 — End: 2017-10-12

## 2017-10-12 MED ORDER — SULFAMETHOXAZOLE-TRIMETHOPRIM 800-160 MG PO TABS: 1.0000 | ORAL_TABLET | Freq: Two times a day (BID) | ORAL | Status: DC

## 2017-10-12 MED ORDER — LACTATED RINGERS IV BOLUS: 1000.0000 mL | Freq: Once | INTRAVENOUS | Status: DC
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
## 2017-10-12 NOTE — ED Notes (Signed)
OB rapid response  

## 2017-10-12 NOTE — Progress Notes (Signed)
[facility] called for transfer
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
## 2017-10-12 NOTE — Progress Notes (Addendum)
Dr Macon LargeAnyanwu notified of patient arrival and complaints at this time; [facility] hours of monitoring at this time; patient also has UTI so orders placed for urine culture and abx initiation; will notify ED provider at this time
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
## 2017-10-13 DIAGNOSIS — T1490XD Injury, unspecified, subsequent encounter: Secondary | ICD-10-CM

## 2017-10-13 DIAGNOSIS — Z3A25 25 weeks gestation of pregnancy: Secondary | ICD-10-CM
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
## 2017-10-13 NOTE — Discharge Instructions (Signed)
Your baby looked great on the monitoring and you were not having consistent uterine contractions during your period of monitoring. You are safe to go home. Please return if you have decreased fetal movement, have contractions, vaginal bleeding, or leaking of fluid.

## 2017-10-14 LAB — CULTURE, OB URINE

## 2017-11-11 ENCOUNTER — Encounter (HOSPITAL_COMMUNITY): Payer: Self-pay | Admitting: *Deleted

## 2017-11-11 ENCOUNTER — Inpatient Hospital Stay (HOSPITAL_COMMUNITY)
Admission: AD | Admit: 2017-11-11 | Discharge: 2017-11-11 | Disposition: A | Discharge status: Home / Self Care | Payer: Medicaid Other | Source: Ambulatory Visit | Attending: Obstetrics and Gynecology | Admitting: Obstetrics and Gynecology

## 2017-11-11 DIAGNOSIS — E860 Dehydration: Secondary | ICD-10-CM | POA: Diagnosis not present

## 2017-11-11 DIAGNOSIS — R42 Dizziness and giddiness: Secondary | ICD-10-CM | POA: Insufficient documentation

## 2017-11-11 DIAGNOSIS — Z880 Allergy status to penicillin: Secondary | ICD-10-CM | POA: Insufficient documentation

## 2017-11-11 DIAGNOSIS — Z3A29 29 weeks gestation of pregnancy: Secondary | ICD-10-CM | POA: Diagnosis not present

## 2017-11-11 DIAGNOSIS — R0602 Shortness of breath: Secondary | ICD-10-CM | POA: Insufficient documentation

## 2017-11-11 DIAGNOSIS — O26893 Other specified pregnancy related conditions, third trimester: Secondary | ICD-10-CM | POA: Diagnosis not present

## 2017-11-11 DIAGNOSIS — R51 Headache: Secondary | ICD-10-CM | POA: Insufficient documentation

## 2017-11-11 DIAGNOSIS — O99891 Other specified diseases and conditions complicating pregnancy: Secondary | ICD-10-CM

## 2017-11-11 DIAGNOSIS — O9989 Other specified diseases and conditions complicating pregnancy, childbirth and the puerperium: Secondary | ICD-10-CM

## 2017-11-11 LAB — BASIC METABOLIC PANEL
Anion gap: 10 (ref 5–15)
BUN: 6 mg/dL (ref 6–20)
CO2: 21 mmol/L — ABNORMAL LOW (ref 22–32)
CREATININE: 0.46 mg/dL (ref 0.44–1.00)
Calcium: 9.3 mg/dL (ref 8.9–10.3)
Chloride: 104 mmol/L (ref 98–111)
GFR calc Af Amer: >60 mL/min (ref >60)
GFR calc non Af Amer: >60 mL/min (ref >60)
GLUCOSE: 65 mg/dL — AB (ref 70–99)
Potassium: 4 mmol/L (ref 3.5–5.1)
Sodium: 135 mmol/L (ref 135–145)

## 2017-11-11 LAB — CBC
HCT: 31.5 % — ABNORMAL LOW (ref 36.0–46.0)
Hemoglobin: 10.3 g/dL — ABNORMAL LOW (ref 12.0–15.0)
MCH: 27.4 pg (ref 26.0–34.0)
MCHC: 32.7 g/dL (ref 30.0–36.0)
MCV: 83.8 fL (ref 78.0–100.0)
PLATELETS: 182 10*3/uL (ref 150–400)
RBC: 3.76 MIL/uL — AB (ref 3.87–5.11)
RDW: 13.8 % (ref 11.5–15.5)
WBC: 10 10*3/uL (ref 4.0–10.5)

## 2017-11-11 LAB — URINALYSIS, ROUTINE W REFLEX MICROSCOPIC
BILIRUBIN URINE: NEGATIVE
GLUCOSE, UA: NEGATIVE mg/dL
Hgb urine dipstick: NEGATIVE
KETONES UR: 80 mg/dL — AB
Nitrite: NEGATIVE
PH: 5 (ref 5.0–8.0)
Protein, ur: 30 mg/dL — AB
Specific Gravity, Urine: 1.02 (ref 1.005–1.030)

## 2017-11-11 MED ORDER — ACETAMINOPHEN 500 MG PO TABS
1000.0000 mg | ORAL_TABLET | Freq: Once | ORAL | Status: AC
  Administered 2017-11-11: 1000 mg via ORAL
  Filled 2017-11-11: qty 2

## 2017-11-11 NOTE — Progress Notes (Signed)
Pt sitting up eating, FHR not tracing secondary pt position.

## 2017-11-11 NOTE — MAU Provider Note (Signed)
Chief Complaint:  Dizziness; Chest Pain; Shortness of Breath; and Headache   First Provider Initiated Contact with Patient 11/11/17 1238     HPI: Janice White is a 27 y.o. G1P0000 at 73w2dwho presents to maternity admissions reporting dizziness, chest pain, SOB, and headache. Symptoms began today while at work. 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 Thinks he is dehydrated as well. Had cereal this morning & 1 glass of water.  Hx of asthma; last used albuterol inhaler last night. Denies wheezing, cough, fever. Had left sided chest pain while at work that felt sharp. Pain has since resolved.  No abdominal pain, n/v/d, vaginal bleeding, or LOF. Positive fetal movement.   Location: head Quality: dull Severity: 7/10 in pain scale Duration: 2 hours Timing: constant Modifying factors: hasn't treated symptoms. Nothing makes worse.  Associated signs and symptoms: dizziness, sob, chest pain   Past Medical History:  Diagnosis Date  . Asthma   . PONV (postoperative nausea and vomiting)    OB History  Gravida Para Term Preterm AB Living  1 0 0 0 0 0  SAB TAB Ectopic Multiple Live Births  0 0 0 0      # Outcome Date GA Lbr Len/2nd Weight Sex Delivery Anes PTL Lv  1 Current            Past Surgical History:  Procedure Laterality Date  . WISDOM TOOTH EXTRACTION     Family History  Problem Relation Age of Onset  . Diabetes Mother   . Hypertension Mother   . Multiple sclerosis Mother   . Heart disease Father   . Stroke Father   . Diabetes Maternal Grandmother    Social History   Tobacco Use  . Smoking status: Never Smoker  . Smokeless tobacco: Never Used  Substance Use Topics  . Alcohol use: No  . Drug use: No   Allergies  Allergen Reactions  . Penicillins Itching    Has patient had a PCN reaction causing immediate rash, facial/tongue/throat swelling, SOB or lightheadedness with hypotension: No Has patient had a PCN reaction causing severe rash involving  mucus membranes or skin necrosis: No Has patient had a PCN reaction that required hospitalization No Has patient had a PCN reaction occurring within the last 10 years: Yes If all of the above answers are "NO", then may proceed with Cephalosporin use.    Medications Prior to Admission  Medication Sig Dispense Refill Last Dose  . ondansetron (ZOFRAN ODT) 4 MG disintegrating tablet Take 1 tablet (4 mg total) by mouth every 8 (eight) hours as needed for nausea or vomiting. (Patient not taking: Reported on 10/05/2017) 20 tablet 0 Not Taking at Unknown time  . Prenatal Vit-Fe Fumarate-FA (PRENATAL VITAMIN PO) Take by mouth.   10/11/2017 at Unknown time    I have reviewed patient's Past Medical Hx, Surgical Hx, Family Hx, Social Hx, medications and allergies.   ROS:  Review of Systems  Constitutional: Negative.   Eyes: Negative for visual disturbance.  Respiratory: Positive for shortness of breath. Negative for wheezing.   Cardiovascular: Positive for chest pain and palpitations.  Gastrointestinal: Negative for abdominal pain, diarrhea, nausea and vomiting.  Genitourinary: Negative.   Neurological: Positive for dizziness and headaches. Negative for syncope.    Physical Exam   Patient Vitals for the past 24 hrs:  BP Temp Temp src Pulse Resp SpO2 Height Weight  11/11/17 1226 - - - - - 100 % - -  11/11/17 1208 101/64  98.4 F (36.9 C) Oral 90 16 100 % '4\' 11"'  (1.499 m) 58.5 kg    Constitutional: Well-developed, well-nourished female in no acute distress.  Cardiovascular: normal rate & rhythm, no murmur Respiratory: normal effort, lung sounds clear throughout GI: Abd soft, non-tender, gravid appropriate for gestational age. Pos BS x 4 MS: Extremities nontender, no edema, normal ROM Neurologic: Alert and oriented x 4.  GU:    Cervix closed/thick/high  NST:  Baseline: 145 bpm, Variability: Good {> 6 bpm), Accelerations: Reactive and Decelerations: Absent   Labs: Results for orders placed  or performed during the hospital encounter of 11/11/17 (from the past 48 hour(s))  Urinalysis, Routine w reflex microscopic     Status: Abnormal   Collection Time: 11/11/17 12:23 PM  Result Value Ref Range   Color, Urine AMBER (A) YELLOW    Comment: BIOCHEMICALS MAY BE AFFECTED BY COLOR   APPearance CLOUDY (A) CLEAR   Specific Gravity, Urine 1.020 1.005 - 1.030   pH 5.0 5.0 - 8.0   Glucose, UA NEGATIVE NEGATIVE mg/dL   Hgb urine dipstick NEGATIVE NEGATIVE   Bilirubin Urine NEGATIVE NEGATIVE   Ketones, ur 80 (A) NEGATIVE mg/dL   Protein, ur 30 (A) NEGATIVE mg/dL   Nitrite NEGATIVE NEGATIVE   Leukocytes, UA LARGE (A) NEGATIVE   RBC / HPF 0-5 0 - 5 RBC/hpf   WBC, UA 6-10 0 - 5 WBC/hpf   Bacteria, UA RARE (A) NONE SEEN   Squamous Epithelial / LPF 11-20 0 - 5   Mucus PRESENT     Comment: Performed at [facility], [address], [place], [place] [address]  CBC     Status: Abnormal   Collection Time: 11/11/17  1:04 PM  Result Value Ref Range   WBC 10.0 4.0 - 10.5 K/uL   RBC 3.76 (L) 3.87 - 5.11 MIL/uL   Hemoglobin 10.3 (L) 12.0 - 15.0 g/dL   HCT 31.5 (L) 36.0 - 46.0 %   MCV 83.8 78.0 - 100.0 fL   MCH 27.4 26.0 - 34.0 pg   MCHC 32.7 30.0 - 36.0 g/dL   RDW 13.8 11.5 - 15.5 %   Platelets 182 150 - 400 K/uL    Comment: Performed at [facility], [address], [place], [place] [address]  Basic metabolic panel     Status: Abnormal   Collection Time: 11/11/17  1:04 PM  Result Value Ref Range   Sodium 135 135 - 145 mmol/L   Potassium 4.0 3.5 - 5.1 mmol/L   Chloride 104 98 - 111 mmol/L   CO2 21 (L) 22 - 32 mmol/L   Glucose, Bld 65 (L) 70 - 99 mg/dL   BUN 6 6 - 20 mg/dL   Creatinine, Ser 0.46 0.44 - 1.00 mg/dL   Calcium 9.3 8.9 - 10.3 mg/dL   GFR calc non Af Amer >60 >60 mL/min   GFR calc Af Amer >60 >60 mL/min    Comment: (NOTE) The eGFR has been calculated using the CKD EPI equation. This calculation has not been validated in all clinical situations. eGFR's  persistently <60 mL/min signify possible Chronic Kidney Disease.    Anion gap 10 5 - 15    Comment: Performed at [facility], [address] [place], [place] [address]    MAU Course: Orders Placed This Encounter  Procedures  . Urinalysis, Routine w reflex microscopic  . ED EKG   Meds ordered this encounter  Medications  . acetaminophen (TYLENOL) tablet 1,000 mg    MDM:  Reactive tracing. Some contractions on monitor that spaced out after eating & drinking. Pt denies abdominal pain. Cervix closed/thick.  VSS. Normal EKG. SpO2 100%. Lung sounds clear throughout.  Tylenol 1 gm PO Pt reports improvement in symptoms after eating and taking tylenol  Assessment: 1. Dehydration   2. Pregnancy headache in third trimester   3. [redacted] weeks gestation of pregnancy   4. Dizziness   5. Shortness of breath during pregnancy     Plan: Discharge home in stable condition.  Stressed importance of staying hydrated and eating at regular intervals, especially while working Discussed reasons to return to MAU   Allergies as of 11/11/2017      Reactions   Penicillins Itching   Has patient had a PCN reaction causing immediate rash, facial/tongue/throat swelling, SOB or lightheadedness with hypotension: No Has patient had a PCN reaction causing severe rash involving mucus membranes or skin necrosis: No Has patient had a PCN reaction that required hospitalization No Has patient had a PCN reaction occurring within the last 10 years: Yes If all of the above answers are "NO", then may proceed with Cephalosporin use.      Medication List    TAKE these medications   PRENATAL VITAMIN PO Take by mouth.     ASK your doctor about these medications   ondansetron 4 MG disintegrating tablet Commonly known as:  ZOFRAN-ODT Take 1 tablet (4 mg total) by mouth every 8 (eight) hours as needed for nausea or vomiting.       Jorje Guild, NP 11/11/2017 12:38 PM

## 2017-11-11 NOTE — MAU Note (Signed)
Pt feels lightheaded & dizzy, feels like having palpitations.  Has HA, has L side chest pain - not as severe as earlier today.  Also C/O SOB.  All sx's started today.  Denies abd pain, bleeding or LOF.  Reports fetal movement.

## 2017-11-11 NOTE — Discharge Instructions (Signed)
Hypoglycemia Hypoglycemia occurs when the level of sugar (glucose) in the blood is too low. Glucose is a type of sugar that provides the body's main source of energy. Certain hormones (insulin and glucagon) control the level of glucose in the blood. Insulin lowers blood glucose, and glucagon increases blood glucose. Hypoglycemia can result from having too much insulin in the bloodstream, or from not eating enough food that contains glucose. Hypoglycemia can happen in people who do or do not have diabetes. It can develop quickly, and it can be a medical emergency. What are the causes? Hypoglycemia occurs most often in people who have diabetes. If you have diabetes, hypoglycemia may be caused by:  Diabetes medicine.  Not eating enough, or not eating often enough.  Increased physical activity.  Drinking alcohol, especially when you have not eaten recently.  If you do not have diabetes, hypoglycemia may be caused by:  A tumor in the pancreas. The pancreas is the organ that makes insulin.  Not eating enough, or not eating for long periods at a time (fasting).  Severe infection or illness that affects the liver, heart, or kidneys.  Certain medicines.  You may also have reactive hypoglycemia. This condition causes hypoglycemia within 4 hours of eating a meal. This may occur after having stomach surgery. Sometimes, the cause of reactive hypoglycemia is not known. What increases the risk? Hypoglycemia is more likely to develop in:  People who have diabetes and take medicines to lower blood glucose.  People who abuse alcohol.  People who have a severe illness.  What are the signs or symptoms? Hypoglycemia may not cause any symptoms. If you have symptoms, they may include:  Hunger.  Anxiety.  Sweating and feeling clammy.  Confusion.  Dizziness or feeling light-headed.  Sleepiness.  Nausea.  Increased heart rate.  Headache.  Blurry  vision.  Seizure.  Nightmares.  Tingling or numbness around the mouth, lips, or tongue.  A change in speech.  Decreased ability to concentrate.  A change in coordination.  Restless sleep.  Tremors or shakes.  Fainting.  Irritability.  How is this diagnosed? Hypoglycemia is diagnosed with a blood test to measure your blood glucose level. This blood test is done while you are having symptoms. Your health care provider may also do a physical exam and review your medical history. If you do not have diabetes, other tests may be done to find the cause of your hypoglycemia. How is this treated? This condition can often be treated by immediately eating or drinking something that contains glucose, such as:  3-4 sugar tablets (glucose pills).  Glucose gel, 15-gram tube.  Fruit juice, 4 oz (120 mL).  Regular soda (not diet soda), 4 oz (120 mL).  Low-fat milk, 4 oz (120 mL).  Several pieces of hard candy.  Sugar or honey, 1 Tbsp.  Treating Hypoglycemia If You Have Diabetes  If you are alert and able to swallow safely, follow the 15:15 rule:  Take 15 grams of a rapid-acting carbohydrate. Rapid-acting options include: ? 1 tube of glucose gel. ? 3 glucose pills. ? 6-8 pieces of hard candy. ? 4 oz (120 mL) of fruit juice. ? 4 oz (120 ml) of regular (not diet) soda.  Treating Severe Hypoglycemia Severe hypoglycemia is when your blood glucose level is at or below 54 mg/dL (3 mmol/L).  Severe hypoglycemia is an emergency. Do not wait to see if the symptoms will go away. Get medical help right away. Call your local emergency services (911 in  the [place] Do not drive yourself to the hospital. If you have severe hypoglycemia and you cannot eat or drink, you may need an injection of glucagon. A family member or close friend should learn how to check your blood glucose and how to give you a glucagon injection. Ask your health care provider if you need to have an emergency glucagon  injection kit available. Severe hypoglycemia may need to be treated in a hospital. The treatment may include getting glucose through an IV tube. You may also need treatment for the cause of your hypoglycemia. Follow these instructions at home: General instructions  Avoid any diets that cause you to not eat enough food. Talk with your health care provider before you start any new diet.  Take over-the-counter and prescription medicines only as told by your health care provider.  Limit alcohol intake to no more than 1 drink per day for nonpregnant women and 2 drinks per day for men. One drink equals 12 oz of beer, 5 oz of wine, or 1 oz of hard liquor.  Keep all follow-up visits as told by your health care provider. This is important.  Contact a health care provider if:  You have problems keeping your blood glucose in your target range.  You have frequent episodes of hypoglycemia. Get help right away if:  You continue to have hypoglycemia symptoms after eating or drinking something containing glucose.  Your blood glucose is at or below 54 mg/dL (3 mmol/L).  You have a seizure.  You faint. These symptoms may represent a serious problem that is an emergency. Do not wait to see if the symptoms will go away. Get medical help right away. Call your local emergency services (911 in the [place] Do not drive yourself to the hospital. This information is not intended to replace advice given to you by your health care provider. Make sure you discuss any questions you have with your health care provider. Document Released: 02/02/2005 Document Revised: 07/17/2015 Document Reviewed: 03/08/2015 Elsevier Interactive Patient Education  [employer].

## 2017-11-18 ENCOUNTER — Encounter (HOSPITAL_COMMUNITY): Payer: Self-pay

## 2017-11-18 ENCOUNTER — Inpatient Hospital Stay (HOSPITAL_COMMUNITY): Payer: Medicaid Other

## 2017-11-18 ENCOUNTER — Inpatient Hospital Stay (HOSPITAL_COMMUNITY)
Admission: AD | Admit: 2017-11-18 | Discharge: 2017-11-18 | Disposition: A | Discharge status: Home / Self Care | Payer: Medicaid Other | Source: Ambulatory Visit | Attending: Obstetrics and Gynecology | Admitting: Obstetrics and Gynecology

## 2017-11-18 DIAGNOSIS — O9989 Other specified diseases and conditions complicating pregnancy, childbirth and the puerperium: Secondary | ICD-10-CM | POA: Diagnosis not present

## 2017-11-18 DIAGNOSIS — Z820 Family history of epilepsy and other diseases of the nervous system: Secondary | ICD-10-CM | POA: Insufficient documentation

## 2017-11-18 DIAGNOSIS — R0602 Shortness of breath: Secondary | ICD-10-CM | POA: Diagnosis not present

## 2017-11-18 DIAGNOSIS — Z3A30 30 weeks gestation of pregnancy: Secondary | ICD-10-CM | POA: Insufficient documentation

## 2017-11-18 DIAGNOSIS — O26893 Other specified pregnancy related conditions, third trimester: Secondary | ICD-10-CM | POA: Diagnosis not present

## 2017-11-18 DIAGNOSIS — Z8249 Family history of ischemic heart disease and other diseases of the circulatory system: Secondary | ICD-10-CM | POA: Insufficient documentation

## 2017-11-18 DIAGNOSIS — Z8709 Personal history of other diseases of the respiratory system: Secondary | ICD-10-CM | POA: Insufficient documentation

## 2017-11-18 DIAGNOSIS — Z880 Allergy status to penicillin: Secondary | ICD-10-CM | POA: Diagnosis not present

## 2017-11-18 DIAGNOSIS — R079 Chest pain, unspecified: Secondary | ICD-10-CM | POA: Diagnosis not present

## 2017-11-18 DIAGNOSIS — Z833 Family history of diabetes mellitus: Secondary | ICD-10-CM | POA: Diagnosis not present

## 2017-11-18 DIAGNOSIS — Z823 Family history of stroke: Secondary | ICD-10-CM | POA: Insufficient documentation

## 2017-11-18 MED ORDER — ACETAMINOPHEN 325 MG PO TABS: 650.0000 mg | ORAL_TABLET | Freq: Four times a day (QID) | ORAL | Status: DC | PRN

## 2017-11-18 MED ORDER — HYDROXYZINE PAMOATE 50 MG PO CAPS: 50.0000 mg | ORAL_CAPSULE | Freq: Two times a day (BID) | ORAL | 0 refills | Status: DC | PRN

## 2017-11-18 NOTE — MAU Note (Addendum)
Heart palpitations and SOB.  Reports this has been happening intermittently throughout her 3rd trimester.  Hasn't seen a cardiologist for anything.  Patient not currently in any obvious distress, scrolling through social media apps on her phone.  Hx of asthma-used inhaler last night without relief.   Reports + FM.  No LOF/VB.

## 2017-11-18 NOTE — MAU Provider Note (Addendum)
Chief Complaint:  Shortness of Breath   First Provider Initiated Contact with Patient 11/18/17 [phone]     HPI: Janice White is a 27 y.o. G1P0000 at 25w2dwho presents to maternity admissions reporting shortness of breath and chest pain  Was seen for this same complaint on 11/11/17.  States used her inhaler at midnight and it did not help.  Does not appear short of breath, but states she feels it.  Chest pain is left mid-chest and is similar to what she had last week and over the past month.  States this all started when she started getting ready for work this morning.  She reports good fetal movement, denies LOF, vaginal bleeding, vaginal itching/burning, urinary symptoms, h/a, dizziness, n/v, diarrhea, constipation or fever/chills.   Shortness of Breath  This is a recurrent problem. The current episode started 1 to 4 weeks ago. The problem occurs constantly. The problem has been unchanged. Pertinent negatives include no abdominal pain, fever, rhinorrhea, sputum production or syncope. Nothing aggravates the symptoms. She has tried beta agonist inhalers for the symptoms. The treatment provided no relief.    Past Medical History: Past Medical History:  Diagnosis Date  . Asthma   . PONV (postoperative nausea and vomiting)     Past obstetric history: OB History  Gravida Para Term Preterm AB Living  1 0 0 0 0 0  SAB TAB Ectopic Multiple Live Births  0 0 0 0      # Outcome Date GA Lbr Len/2nd Weight Sex Delivery Anes PTL Lv  1 Current             Past Surgical History: Past Surgical History:  Procedure Laterality Date  . WISDOM TOOTH EXTRACTION      Family History: Family History  Problem Relation Age of Onset  . Diabetes Mother   . Hypertension Mother   . Multiple sclerosis Mother   . Heart disease Father   . Stroke Father   . Diabetes Maternal Grandmother     Social History: Social History   Tobacco Use  . Smoking status: Never Smoker  . Smokeless tobacco: Never Used   Substance Use Topics  . Alcohol use: No  . Drug use: No    Allergies:  Allergies  Allergen Reactions  . Penicillins Itching    Has patient had a PCN reaction causing immediate rash, facial/tongue/throat swelling, SOB or lightheadedness with hypotension: No Has patient had a PCN reaction causing severe rash involving mucus membranes or skin necrosis: No Has patient had a PCN reaction that required hospitalization No Has patient had a PCN reaction occurring within the last 10 years: Yes If all of the above answers are "NO", then may proceed with Cephalosporin use.     Meds:  Medications Prior to Admission  Medication Sig Dispense Refill Last Dose  . Prenatal Vit-Fe Fumarate-FA (PRENATAL VITAMIN PO) Take by mouth.   11/17/2017 at Unknown time  . ondansetron (ZOFRAN ODT) 4 MG disintegrating tablet Take 1 tablet (4 mg total) by mouth every 8 (eight) hours as needed for nausea or vomiting. (Patient not taking: Reported on 10/05/2017) 20 tablet 0 Not Taking at Unknown time    I have reviewed patient's Past Medical Hx, Surgical Hx, Family Hx, Social Hx, medications and allergies.   ROS:  Review of Systems  Constitutional: Negative for fever.  HENT: Negative for rhinorrhea.   Respiratory: Positive for shortness of breath. Negative for sputum production.   Cardiovascular: Negative for syncope.  Gastrointestinal: Negative for abdominal  pain.   Other systems negative  Physical Exam   Patient Vitals for the past 24 hrs:  BP Temp Temp src Pulse Resp SpO2 Height Weight  11/18/17 0833 112/63 98.4 F (36.9 C) Oral 89 18 100 % - -  11/18/17 0750 - - - - - 98 % - -  11/18/17 0636 112/62 98.2 F (36.8 C) - 88 19 100 % 4\' 11"  (1.499 m) 134 lb 2 oz (60.8 kg)   Constitutional: Well-developed, well-nourished female in no acute distress. Does not appear uncomfortable or short of breath Cardiovascular: normal rate and rhythm Respiratory: normal effort, clear to auscultation bilaterally GI: Abd  soft, non-tender, gravid appropriate for gestational age.   No rebound or guarding. MS: Extremities nontender, no edema, normal ROM Neurologic: Alert and oriented x 4.  GU: Neg CVAT.  PELVIC EXAM: deferred   FHT:  Baseline 140 , moderate variability, accelerations present, no decelerations Contractions:   Rare   Labs: No results found for this or any previous visit (from the past 24 hour(s)).  --/--/A POS (08/27 2058)  Imaging:    MAU Course/MDM: I have ordered chest xray and reviewed results.  NST reviewed, reassuring   Treatments in MAU included EFM.    Assessment: 1. [redacted] weeks gestation of pregnancy   2. Shortness of breath   3. Chest pain     Plan: Report to oncoming provider  [name] CNM, MSN Certified Nurse-Midwife 11/18/2017 0800  VSS, NAD. Chest xray & EKG normal   P: Discharge home in stable condition Work note given to be out of work today & work restrictions note given.  Pt has next ob Appt on Monday Discussed reasons to return to MAU  Judeth Horn, NP

## 2017-11-18 NOTE — Discharge Instructions (Signed)
Research childbirth classes and hospital preregistration at [email or web address]  Fetal Movement Counts Patient Name: ________________________________________________ Patient Due Date: ____________________ What is a fetal movement count? A fetal movement count is the number of times that you feel your baby move during a certain amount of time. This may also be called a fetal kick count. A fetal movement count is recommended for every pregnant woman. You may be asked to start counting fetal movements as early as week 28 of your pregnancy. Pay attention to when your baby is most active. You may notice your baby's sleep and wake cycles. You may also notice things that make your baby move more. You should do a fetal movement count:  When your baby is normally most active.  At the same time each day.  A good time to count movements is while you are resting, after having something to eat and drink. How do I count fetal movements? 1. Find a quiet, comfortable area. Sit, or lie down on your side. 2. Write down the date, the start time and stop time, and the number of movements that you felt between those two times. Take this information with you to your health care visits. 3. For 2 hours, count kicks, flutters, swishes, rolls, and jabs. You should feel at least 10 movements during 2 hours. 4. You may stop counting after you have felt 10 movements. 5. If you do not feel 10 movements in 2 hours, have something to eat and drink. Then, keep resting and counting for 1 hour. If you feel at least 4 movements during that hour, you may stop counting. Contact a health care provider if:  You feel fewer than 4 movements in 2 hours.  Your baby is not moving like he or she usually does. Date: ____________ Start time: ____________ Stop time: ____________ Movements: ____________ Date: ____________ Start time: ____________ Stop time: ____________ Movements: ____________ Date: ____________ Start time: ____________  Stop time: ____________ Movements: ____________ Date: ____________ Start time: ____________ Stop time: ____________ Movements: ____________ Date: ____________ Start time: ____________ Stop time: ____________ Movements: ____________ Date: ____________ Start time: ____________ Stop time: ____________ Movements: ____________ Date: ____________ Start time: ____________ Stop time: ____________ Movements: ____________ Date: ____________ Start time: ____________ Stop time: ____________ Movements: ____________ Date: ____________ Start time: ____________ Stop time: ____________ Movements: ____________ This information is not intended to replace advice given to you by your health care provider. Make sure you discuss any questions you have with your health care provider. Document Released: 03/04/2006 Document Revised: 10/02/2015 Document Reviewed: 03/14/2015 Elsevier Interactive Patient Education  2018 [employer]  Braxton Hicks Contractions Contractions of the uterus can occur throughout pregnancy, but they are not always a sign that you are in labor. You may have practice contractions called Braxton Hicks contractions. These false labor contractions are sometimes confused with true labor. What are Braxton Hicks contractions? Braxton Hicks contractions are tightening movements that occur in the muscles of the uterus before labor. Unlike true labor contractions, these contractions do not result in opening (dilation) and thinning of the cervix. Toward the end of pregnancy (32-34 weeks), Braxton Hicks contractions can happen more often and may become stronger. These contractions are sometimes difficult to tell apart from true labor because they can be very uncomfortable. You should not feel embarrassed if you go to the hospital with false labor. Sometimes, the only way to tell if you are in true labor is for your health care provider to look for changes in the cervix. The health care provider will   do a physical  exam and may monitor your contractions. If you are not in true labor, the exam should show that your cervix is not dilating and your water has not broken. If there are other health problems associated with your pregnancy, it is completely safe for you to be sent home with false labor. You may continue to have Braxton Hicks contractions until you go into true labor. How to tell the difference between true labor and false labor True labor  Contractions last 30-70 seconds.  Contractions become very regular.  Discomfort is usually felt in the top of the uterus, and it spreads to the lower abdomen and low back.  Contractions do not go away with walking.  Contractions usually become more intense and increase in frequency.  The cervix dilates and gets thinner. False labor  Contractions are usually shorter and not as strong as true labor contractions.  Contractions are usually irregular.  Contractions are often felt in the front of the lower abdomen and in the groin.  Contractions may go away when you walk around or change positions while lying down.  Contractions get weaker and are shorter-lasting as time goes on.  The cervix usually does not dilate or become thin. Follow these instructions at home:  Take over-the-counter and prescription medicines only as told by your health care provider.  Keep up with your usual exercises and follow other instructions from your health care provider.  Eat and drink lightly if you think you are going into labor.  If Braxton Hicks contractions are making you uncomfortable: ? Change your position from lying down or resting to walking, or change from walking to resting. ? Sit and rest in a tub of warm water. ? Drink enough fluid to keep your urine pale yellow. Dehydration may cause these contractions. ? Do slow and deep breathing several times an hour.  Keep all follow-up prenatal visits as told by your health care provider. This is  important. Contact a health care provider if:  You have a fever.  You have continuous pain in your abdomen. Get help right away if:  Your contractions become stronger, more regular, and closer together.  You have fluid leaking or gushing from your vagina.  You pass blood-tinged mucus (bloody show).  You have bleeding from your vagina.  You have low back pain that you never had before.  You feel your baby's head pushing down and causing pelvic pressure.  Your baby is not moving inside you as much as it used to. Summary  Contractions that occur before labor are called Braxton Hicks contractions, false labor, or practice contractions.  Braxton Hicks contractions are usually shorter, weaker, farther apart, and less regular than true labor contractions. True labor contractions usually become progressively stronger and regular and they become more frequent.  Manage discomfort from Braxton Hicks contractions by changing position, resting in a warm bath, drinking plenty of water, or practicing deep breathing. This information is not intended to replace advice given to you by your health care provider. Make sure you discuss any questions you have with your health care provider. Document Released: 06/18/2016 Document Revised: 06/18/2016 Document Reviewed: 06/18/2016 Elsevier Interactive Patient Education  2018 [employer]    

## 2017-12-07 ENCOUNTER — Other Ambulatory Visit: Payer: Self-pay

## 2017-12-07 ENCOUNTER — Encounter (HOSPITAL_COMMUNITY): Payer: Self-pay

## 2017-12-07 ENCOUNTER — Inpatient Hospital Stay (HOSPITAL_COMMUNITY)
Admission: AD | Admit: 2017-12-07 | Discharge: 2017-12-07 | Disposition: A | Discharge status: Home / Self Care | Payer: Medicaid Other | Source: Ambulatory Visit | Attending: Obstetrics and Gynecology | Admitting: Obstetrics and Gynecology

## 2017-12-07 DIAGNOSIS — N760 Acute vaginitis: Secondary | ICD-10-CM

## 2017-12-07 DIAGNOSIS — B9689 Other specified bacterial agents as the cause of diseases classified elsewhere: Secondary | ICD-10-CM | POA: Insufficient documentation

## 2017-12-07 DIAGNOSIS — Z3A33 33 weeks gestation of pregnancy: Secondary | ICD-10-CM | POA: Insufficient documentation

## 2017-12-07 DIAGNOSIS — O23593 Infection of other part of genital tract in pregnancy, third trimester: Secondary | ICD-10-CM | POA: Diagnosis not present

## 2017-12-07 DIAGNOSIS — O4703 False labor before 37 completed weeks of gestation, third trimester: Secondary | ICD-10-CM | POA: Diagnosis not present

## 2017-12-07 LAB — URINALYSIS, ROUTINE W REFLEX MICROSCOPIC
Bacteria, UA: NONE SEEN
Bilirubin Urine: NEGATIVE
Glucose, UA: NEGATIVE mg/dL
Hgb urine dipstick: NEGATIVE
Ketones, ur: 80 mg/dL — AB
Nitrite: NEGATIVE
PH: 5 (ref 5.0–8.0)
Protein, ur: 30 mg/dL — AB
SPECIFIC GRAVITY, URINE: 1.026 (ref 1.005–1.030)

## 2017-12-07 LAB — WET PREP, GENITAL
Sperm: NONE SEEN
TRICH WET PREP: NONE SEEN
Yeast Wet Prep HPF POC: NONE SEEN

## 2017-12-07 MED ORDER — METRONIDAZOLE 500 MG PO TABS: 500.0000 mg | ORAL_TABLET | Freq: Two times a day (BID) | ORAL | 0 refills | Status: AC

## 2017-12-07 MED ORDER — COMFORT FIT MATERNITY SUPP SM MISC: 1.0000 [IU] | Freq: Every day | 0 refills | Status: DC | PRN

## 2017-12-07 NOTE — MAU Provider Note (Signed)
Chief Complaint:  Contractions   First Provider Initiated Contact with Patient 12/07/17 1845     HPI: Janice White is a 27 y.o. G1P0000 at [redacted]w[redacted]d who presents to maternity admissions reporting contractions. Feels like she was having contractions every 6 minutes today. Noted pink discharge on toilet paper this afternoon which hasn't continued. Denies n/v/d, dysuria, vaginal itching/irritation, or LOF. Positive fetal movement. Feels like contractions have stopped since coming to MAU.  Location: lower abdomen Quality: contractions Severity: 5/10 in pain scale Duration: 1 day Timing: every 6 minutes Modifying factors: none Associated signs and symptoms: none   Past Medical History:  Diagnosis Date  . Asthma   . PONV (postoperative nausea and vomiting)    OB History  Gravida Para Term Preterm AB Living  1 0 0 0 0 0  SAB TAB Ectopic Multiple Live Births  0 0 0 0      # Outcome Date GA Lbr Len/2nd Weight Sex Delivery Anes PTL Lv  1 Current            Past Surgical History:  Procedure Laterality Date  . WISDOM TOOTH EXTRACTION     Family History  Problem Relation Age of Onset  . Diabetes Mother   . Hypertension Mother   . Multiple sclerosis Mother   . Heart disease Father   . Stroke Father   . Diabetes Maternal Grandmother    Social History   Tobacco Use  . Smoking status: Never Smoker  . Smokeless tobacco: Never Used  Substance Use Topics  . Alcohol use: No  . Drug use: No   Allergies  Allergen Reactions  . Penicillins Itching    Has patient had a PCN reaction causing immediate rash, facial/tongue/throat swelling, SOB or lightheadedness with hypotension: No Has patient had a PCN reaction causing severe rash involving mucus membranes or skin necrosis: No Has patient had a PCN reaction that required hospitalization No Has patient had a PCN reaction occurring within the last 10 years: Yes If all of the above answers are "NO", then may proceed with Cephalosporin  use.    No medications prior to admission.    I have reviewed patient's Past Medical Hx, Surgical Hx, Family Hx, Social Hx, medications and allergies.   ROS:  Review of Systems  Constitutional: Negative.   Gastrointestinal: Positive for abdominal pain. Negative for constipation, diarrhea, nausea and vomiting.  Genitourinary: Positive for vaginal discharge. Negative for dysuria and vaginal bleeding.    Physical Exam   Patient Vitals for the past 24 hrs:  BP Temp Temp src Pulse Resp SpO2  12/07/17 1925 107/61 - - 88 - -  12/07/17 1808 109/66 98.8 F (37.1 C) Oral 92 18 100 %    Constitutional: Well-developed, well-nourished female in no acute distress.  Cardiovascular: normal rate & rhythm, no murmur Respiratory: normal effort, lung sounds clear throughout GI: Abd soft, non-tender, gravid appropriate for gestational age. Pos BS x 4 MS: Extremities nontender, no edema, normal ROM Neurologic: Alert and oriented x 4.  GU:     No blood. Small amount of thin frothy gray discharge.   Dilation: Closed Effacement (%): Thick Cervical Position: Posterior Exam by:: Judeth Horn NP  NST:  Baseline: 135 bpm, Variability: Good {> 6 bpm), Accelerations: Reactive and Decelerations: Absent   Labs: Results for orders placed or performed during the hospital encounter of 12/07/17 (from the past 24 hour(s))  Urinalysis, Routine w reflex microscopic     Status: Abnormal   Collection Time: 12/07/17  6:27 PM  Result Value Ref Range   Color, Urine YELLOW YELLOW   APPearance HAZY (A) CLEAR   Specific Gravity, Urine 1.026 1.005 - 1.030   pH 5.0 5.0 - 8.0   Glucose, UA NEGATIVE NEGATIVE mg/dL   Hgb urine dipstick NEGATIVE NEGATIVE   Bilirubin Urine NEGATIVE NEGATIVE   Ketones, ur 80 (A) NEGATIVE mg/dL   Protein, ur 30 (A) NEGATIVE mg/dL   Nitrite NEGATIVE NEGATIVE   Leukocytes, UA TRACE (A) NEGATIVE   RBC / HPF 0-5 0 - 5 RBC/hpf   WBC, UA 0-5 0 - 5 WBC/hpf   Bacteria, UA NONE SEEN NONE  SEEN   Squamous Epithelial / LPF 11-20 0 - 5   Mucus PRESENT   Wet prep, genital     Status: Abnormal   Collection Time: 12/07/17  7:00 PM  Result Value Ref Range   Yeast Wet Prep HPF POC NONE SEEN NONE SEEN   Trich, Wet Prep NONE SEEN NONE SEEN   Clue Cells Wet Prep HPF POC PRESENT (A) NONE SEEN   WBC, Wet Prep HPF POC MANY (A) NONE SEEN   Sperm NONE SEEN     Imaging:  No results found.  MAU Course: Orders Placed This Encounter  Procedures  . Wet prep, genital  . Urinalysis, Routine w reflex microscopic  . Discharge patient   Meds ordered this encounter  Medications  . metroNIDAZOLE (FLAGYL) 500 MG tablet    Sig: Take 1 tablet (500 mg total) by mouth 2 (two) times daily for 7 days.    Dispense:  14 tablet    Refill:  0    Order Specific Question:   Supervising Provider    Answer:   ERVIN, MICHAEL L [[id number]]  . Elastic Bandages & Supports (COMFORT FIT MATERNITY SUPP SM) MISC    Sig: 1 Units by Does not apply route daily as needed.    Dispense:  1 each    Refill:  0    Order Specific Question:   Supervising Provider    Answer:   ERVIN, MICHAEL L [[id number]]    MDM: Reactive NST. Some ctx on monitor but says she is not feeling any contractions since being in MAU. No blood on exam. Cervix closed/thick.  Will tx for BV. GC pending Reports low back pain & sciatica that she's discussed in office. Given maternity support belt rx & info for [employer] med supply  Assessment: 1. Preterm uterine contractions in third trimester, antepartum   2. [redacted] weeks gestation of pregnancy   3. BV (bacterial vaginosis)     Plan: Discharge home in stable condition.  Preterm Labor precautions and fetal kick counts  Follow-up Information    Department, [facility] Follow up.   Contact information: [address] [place] [address] [phone]        [facility] Follow up.   Why:  go for maternity support belt Contact information: [address] [place] [address] [phone]           Allergies as of 12/07/2017      Reactions   Penicillins Itching   Has patient had a PCN reaction causing immediate rash, facial/tongue/throat swelling, SOB or lightheadedness with hypotension: No Has patient had a PCN reaction causing severe rash involving mucus membranes or skin necrosis: No Has patient had a PCN reaction that required hospitalization No Has patient had a PCN reaction occurring within the last 10 years: Yes If all of  the above answers are "NO", then may proceed with Cephalosporin use.      Medication List    TAKE these medications   COMFORT FIT MATERNITY SUPP SM Misc 1 Units by Does not apply route daily as needed.   hydrOXYzine 50 MG capsule Commonly known as:  VISTARIL Take 1 capsule (50 mg total) by mouth 2 (two) times daily as needed.   metroNIDAZOLE 500 MG tablet Commonly known as:  FLAGYL Take 1 tablet (500 mg total) by mouth 2 (two) times daily for 7 days.   ondansetron 4 MG disintegrating tablet Commonly known as:  ZOFRAN-ODT Take 1 tablet (4 mg total) by mouth every 8 (eight) hours as needed for nausea or vomiting.   PRENATAL VITAMIN PO Take by mouth.       Judeth Horn, NP 12/07/2017 7:52 PM

## 2017-12-07 NOTE — MAU Note (Signed)
Pt with cxts starting today around 7:30a. Cxts are irregular with pain 5/10 when occurs. Pos FM. No watery leakage. Pt noticed light pink d/c after wiping. No odor. Denies IC <24 hrs.   Adah Perl RN

## 2017-12-07 NOTE — Discharge Instructions (Signed)
Back Pain in Pregnancy Back pain during pregnancy is common. Back pain may be caused by several factors that are related to changes during your pregnancy. Follow these instructions at home: Managing pain, stiffness, and swelling  If directed, apply ice for sudden (acute) back pain. ? Put ice in a plastic bag. ? Place a towel between your skin and the bag. ? Leave the ice on for 20 minutes, 2-3 times per day.  If directed, apply heat to the affected area before you exercise: ? Place a towel between your skin and the heat pack or heating pad. ? Leave the heat on for 20-30 minutes. ? Remove the heat if your skin turns bright red. This is especially important if you are unable to feel pain, heat, or cold. You may have a greater risk of getting burned. Activity  Exercise as told by your health care provider. Exercising is the best way to prevent or manage back pain.  Listen to your body when lifting. If lifting hurts, ask for help or bend your knees. This uses your leg muscles instead of your back muscles.  Squat down when picking up something from the floor. Do not bend over.  Only use bed rest as told by your health care provider. Bed rest should only be used for the most severe episodes of back pain. Standing, Sitting, and Lying Down  Do not stand in one place for long periods of time.  Use good posture when sitting. Make sure your head rests over your shoulders and is not hanging forward. Use a pillow on your lower back if necessary.  Try sleeping on your side, preferably the left side, with a pillow or two between your legs. If you are sore after a night's rest, your bed may be too soft. A firm mattress may provide more support for your back during pregnancy. General instructions  Do not wear high heels.  Eat a healthy diet. Try to gain weight within your health care provider's recommendations.  Use a maternity girdle, elastic sling, or back brace as told by your health care  provider.  Take over-the-counter and prescription medicines only as told by your health care provider.  Keep all follow-up visits as told by your health care provider. This is important. This includes any visits with any specialists, such as a physical therapist. Contact a health care provider if:  Your back pain interferes with your daily activities.  You have increasing pain in other parts of your body. Get help right away if:  You develop numbness, tingling, weakness, or problems with the use of your arms or legs.  You develop severe back pain that is not controlled with medicine.  You have a sudden change in bowel or bladder control.  You develop shortness of breath, dizziness, or you faint.  You develop nausea, vomiting, or sweating.  You have back pain that is a rhythmic, cramping pain similar to labor pains. Labor pain is usually 1-2 minutes apart, lasts for about 1 minute, and involves a bearing down feeling or pressure in your pelvis.  You have back pain and your water breaks or you have vaginal bleeding.  You have back pain or numbness that travels down your leg.  Your back pain developed after you fell.  You develop pain on one side of your back.  You see blood in your urine.  You develop skin blisters in the area of your back pain. This information is not intended to replace advice given to you   by your health care provider. Make sure you discuss any questions you have with your health care provider. Document Released: 05/13/2005 Document Revised: 07/11/2015 Document Reviewed: 10/17/2014 Elsevier Interactive Patient Education  2018 Elsevier Inc.  

## 2017-12-08 LAB — GC/CHLAMYDIA PROBE AMP (~~LOC~~) NOT AT ARMC
Chlamydia: NEGATIVE
NEISSERIA GONORRHEA: NEGATIVE

## 2017-12-28 LAB — OB RESULTS CONSOLE GBS: GBS: NEGATIVE

## 2018-01-18 ENCOUNTER — Inpatient Hospital Stay (HOSPITAL_COMMUNITY)
Admission: AD | Admit: 2018-01-18 | Discharge: 2018-01-18 | Disposition: A | Discharge status: Home / Self Care | Payer: Medicaid Other | Source: Ambulatory Visit | Attending: Obstetrics and Gynecology | Admitting: Obstetrics and Gynecology

## 2018-01-18 ENCOUNTER — Other Ambulatory Visit: Payer: Self-pay

## 2018-01-18 ENCOUNTER — Encounter (HOSPITAL_COMMUNITY): Payer: Self-pay | Admitting: *Deleted

## 2018-01-18 DIAGNOSIS — Z3A39 39 weeks gestation of pregnancy: Secondary | ICD-10-CM | POA: Diagnosis not present

## 2018-01-18 DIAGNOSIS — O26893 Other specified pregnancy related conditions, third trimester: Secondary | ICD-10-CM | POA: Diagnosis not present

## 2018-01-18 DIAGNOSIS — Z0371 Encounter for suspected problem with amniotic cavity and membrane ruled out: Secondary | ICD-10-CM | POA: Diagnosis not present

## 2018-01-18 DIAGNOSIS — Z3689 Encounter for other specified antenatal screening: Secondary | ICD-10-CM

## 2018-01-18 DIAGNOSIS — N898 Other specified noninflammatory disorders of vagina: Secondary | ICD-10-CM

## 2018-01-18 LAB — POCT FERN TEST

## 2018-01-18 NOTE — MAU Note (Signed)
PT SAYS AT 0130-  FELT  FLUID COME OUT IN BED   THEN  WENT TO B-ROOM  AND VOIDED.    PNC WITH  HD.  DENIES HSV AND MRSA. GBS-  NEG.    NO UC'S.    VE - IN OFFICE  CLOSED LAST WEEK.

## 2018-01-18 NOTE — MAU Provider Note (Signed)
First Provider Initiated Contact with Patient 01/18/18 [phone]     S: Ms. Janice White is a 27 y.o. G1P0000 at 4834w0d  who presents to MAU today complaining of leaking of fluid since 0130. She reports having a small amount of fluid come out while laying in bed- has not felt any more fluid pass since one occurrence. She denies vaginal bleeding. She denies contractions. She reports normal fetal movement.    O: BP 117/72 (BP Location: Right Arm)   Pulse 84   Temp 97.6 F (36.4 C) (Oral)   Resp 20   Ht 4\' 11"  (1.499 m)   Wt 62.6 kg   LMP 04/19/2017   SpO2 99%   BMI 27.87 kg/m  GENERAL: Well-developed, well-nourished female in no acute distress.  HEAD: Normocephalic, atraumatic.  CHEST: Normal effort of breathing, regular heart rate ABDOMEN: Soft, nontender, gravid PELVIC: Normal external female genitalia. Vagina is pink and rugated. Cervix with normal contour, no lesions. Normal discharge.  Negative pooling.   Cervical exam:  Dilation: 1 Effacement (%): Thick Cervical Position: Posterior Station: Ballotable Presentation: Vertex Exam by:: Lanice ShirtsV Neysa Arts CNM    Fetal Monitoring: Baseline: 130 Variability: moderate Accelerations: present Decelerations: none Contractions: 2 UC/ mild by palpation   Results for orders placed or performed during the hospital encounter of 01/18/18 (from the past 24 hour(s))  POCT fern test     Status: None   Collection Time: 01/18/18  5:20 AM  Result Value Ref Range   POCT Fern Test     PinelandFern negative    A: [facility] at 3234w0d  Membranes intact NST reactive  P: Discharge home  Follow up as scheduled for prenatal appointments  Return to MAU as needed   Sharyon CableRogers, Hau Sanor C, CNM 01/18/2018 5:42 AM

## 2018-01-18 NOTE — Discharge Instructions (Signed)
Braxton Hicks Contractions °Contractions of the uterus can occur throughout pregnancy, but they are not always a sign that you are in labor. You may have practice contractions called Braxton Hicks contractions. These false labor contractions are sometimes confused with true labor. °What are Braxton Hicks contractions? °Braxton Hicks contractions are tightening movements that occur in the muscles of the uterus before labor. Unlike true labor contractions, these contractions do not result in opening (dilation) and thinning of the cervix. Toward the end of pregnancy (32-34 weeks), Braxton Hicks contractions can happen more often and may become stronger. These contractions are sometimes difficult to tell apart from true labor because they can be very uncomfortable. You should not feel embarrassed if you go to the hospital with false labor. °Sometimes, the only way to tell if you are in true labor is for your health care provider to look for changes in the cervix. The health care provider will do a physical exam and may monitor your contractions. If you are not in true labor, the exam should show that your cervix is not dilating and your water has not broken. °If there are other health problems associated with your pregnancy, it is completely safe for you to be sent home with false labor. You may continue to have Braxton Hicks contractions until you go into true labor. °How to tell the difference between true labor and false labor °True labor °· Contractions last 30-70 seconds. °· Contractions become very regular. °· Discomfort is usually felt in the top of the uterus, and it spreads to the lower abdomen and low back. °· Contractions do not go away with walking. °· Contractions usually become more intense and increase in frequency. °· The cervix dilates and gets thinner. °False labor °· Contractions are usually shorter and not as strong as true labor contractions. °· Contractions are usually irregular. °· Contractions  are often felt in the front of the lower abdomen and in the groin. °· Contractions may go away when you walk around or change positions while lying down. °· Contractions get weaker and are shorter-lasting as time goes on. °· The cervix usually does not dilate or become thin. °Follow these instructions at home: °· Take over-the-counter and prescription medicines only as told by your health care provider. °· Keep up with your usual exercises and follow other instructions from your health care provider. °· Eat and drink lightly if you think you are going into labor. °· If Braxton Hicks contractions are making you uncomfortable: °? Change your position from lying down or resting to walking, or change from walking to resting. °? Sit and rest in a tub of warm water. °? Drink enough fluid to keep your urine pale yellow. Dehydration may cause these contractions. °? Do slow and deep breathing several times an hour. °· Keep all follow-up prenatal visits as told by your health care provider. This is important. °Contact a health care provider if: °· You have a fever. °· You have continuous pain in your abdomen. °Get help right away if: °· Your contractions become stronger, more regular, and closer together. °· You have fluid leaking or gushing from your vagina. °· You pass blood-tinged mucus (bloody show). °· You have bleeding from your vagina. °· You have low back pain that you never had before. °· You feel your baby’s head pushing down and causing pelvic pressure. °· Your baby is not moving inside you as much as it used to. °Summary °· Contractions that occur before labor are called Braxton   Hicks contractions, false labor, or practice contractions. °· Braxton Hicks contractions are usually shorter, weaker, farther apart, and less regular than true labor contractions. True labor contractions usually become progressively stronger and regular and they become more frequent. °· Manage discomfort from Braxton Hicks contractions by  changing position, resting in a warm bath, drinking plenty of water, or practicing deep breathing. °This information is not intended to replace advice given to you by your health care provider. Make sure you discuss any questions you have with your health care provider. °Document Released: 06/18/2016 Document Revised: 06/18/2016 Document Reviewed: 06/18/2016 °Elsevier Interactive Patient Education © 2018 Elsevier Inc. ° °

## 2018-01-25 ENCOUNTER — Other Ambulatory Visit: Payer: Self-pay

## 2018-01-25 ENCOUNTER — Inpatient Hospital Stay (HOSPITAL_COMMUNITY)
Admission: AD | Admit: 2018-01-25 | Discharge: 2018-01-26 | Disposition: A | Discharge status: Home / Self Care | Payer: Medicaid Other | Source: Home / Self Care | Attending: Family Medicine | Admitting: Family Medicine

## 2018-01-25 ENCOUNTER — Encounter (HOSPITAL_COMMUNITY): Payer: Self-pay | Admitting: *Deleted

## 2018-01-25 DIAGNOSIS — O471 False labor at or after 37 completed weeks of gestation: Secondary | ICD-10-CM

## 2018-01-25 DIAGNOSIS — Z3A37 37 weeks gestation of pregnancy: Secondary | ICD-10-CM

## 2018-01-25 NOTE — MAU Note (Signed)
Pt presents to MAU c/o ctx every 5-667mins. Pt denies bleeding or LOF. +FM. Lost mucous plug at 2200.

## 2018-01-26 ENCOUNTER — Encounter (HOSPITAL_COMMUNITY): Payer: Self-pay | Admitting: *Deleted

## 2018-01-26 ENCOUNTER — Inpatient Hospital Stay (HOSPITAL_COMMUNITY): Payer: Medicaid Other | Admitting: Anesthesiology

## 2018-01-26 ENCOUNTER — Inpatient Hospital Stay (HOSPITAL_COMMUNITY)
Admission: AD | Admit: 2018-01-26 | Discharge: 2018-01-29 | DRG: 807 | Disposition: A | Discharge status: Home / Self Care | Payer: Medicaid Other | Attending: Obstetrics and Gynecology | Admitting: Obstetrics and Gynecology

## 2018-01-26 DIAGNOSIS — Z3A40 40 weeks gestation of pregnancy: Secondary | ICD-10-CM

## 2018-01-26 DIAGNOSIS — Z880 Allergy status to penicillin: Secondary | ICD-10-CM

## 2018-01-26 DIAGNOSIS — Z3483 Encounter for supervision of other normal pregnancy, third trimester: Secondary | ICD-10-CM | POA: Diagnosis present

## 2018-01-26 DIAGNOSIS — O480 Post-term pregnancy: Secondary | ICD-10-CM | POA: Diagnosis not present

## 2018-01-26 LAB — CBC
HEMATOCRIT: 34.5 % — AB (ref 36.0–46.0)
Hemoglobin: 11 g/dL — ABNORMAL LOW (ref 12.0–15.0)
MCH: 26 pg (ref 26.0–34.0)
MCHC: 31.9 g/dL (ref 30.0–36.0)
MCV: 81.6 fL (ref 80.0–100.0)
Platelets: 214 10*3/uL (ref 150–400)
RBC: 4.23 MIL/uL (ref 3.87–5.11)
RDW: 16 % — ABNORMAL HIGH (ref 11.5–15.5)
WBC: 9.1 10*3/uL (ref 4.0–10.5)
nRBC: 0 % (ref 0.0–0.2)

## 2018-01-26 LAB — TYPE AND SCREEN
ABO/RH(D): A POS
Antibody Screen: NEGATIVE

## 2018-01-26 MED ORDER — BUTORPHANOL TARTRATE 1 MG/ML IJ SOLN
1.0000 mg | Freq: Once | INTRAMUSCULAR | Status: AC
  Administered 2018-01-26: 1 mg via INTRAMUSCULAR
  Filled 2018-01-26: qty 1

## 2018-01-26 MED ORDER — DIPHENHYDRAMINE HCL 50 MG/ML IJ SOLN
12.5000 mg | INTRAMUSCULAR | Status: AC | PRN
  Administered 2018-01-26 – 2018-01-27 (×3): 12.5 mg via INTRAVENOUS
  Filled 2018-01-26 (×3): qty 1

## 2018-01-26 MED ORDER — OXYCODONE-ACETAMINOPHEN 5-325 MG PO TABS: 1.0000 | ORAL_TABLET | ORAL | Status: DC | PRN

## 2018-01-26 MED ORDER — PHENYLEPHRINE 40 MCG/ML (10ML) SYRINGE FOR IV PUSH (FOR BLOOD PRESSURE SUPPORT)
80.0000 ug | PREFILLED_SYRINGE | INTRAVENOUS | Status: DC | PRN
  Filled 2018-01-26 (×2): qty 10

## 2018-01-26 MED ORDER — OXYTOCIN BOLUS FROM INFUSION
500.0000 mL | Freq: Once | INTRAVENOUS | Status: AC
  Administered 2018-01-27: 500 mL via INTRAVENOUS

## 2018-01-26 MED ORDER — LIDOCAINE HCL (PF) 1 % IJ SOLN
INTRAMUSCULAR | Status: DC | PRN
  Administered 2018-01-26: 13 mL via EPIDURAL

## 2018-01-26 MED ORDER — PROMETHAZINE HCL 25 MG/ML IJ SOLN
12.5000 mg | Freq: Once | INTRAMUSCULAR | Status: AC
  Administered 2018-01-26: 12.5 mg via INTRAMUSCULAR
  Filled 2018-01-26: qty 1

## 2018-01-26 MED ORDER — SOD CITRATE-CITRIC ACID 500-334 MG/5ML PO SOLN: 30.0000 mL | ORAL | Status: DC | PRN

## 2018-01-26 MED ORDER — EPHEDRINE 5 MG/ML INJ
10.0000 mg | INTRAVENOUS | Status: DC | PRN
  Filled 2018-01-26: qty 2

## 2018-01-26 MED ORDER — PHENYLEPHRINE 40 MCG/ML (10ML) SYRINGE FOR IV PUSH (FOR BLOOD PRESSURE SUPPORT)
80.0000 ug | PREFILLED_SYRINGE | INTRAVENOUS | Status: DC | PRN
  Filled 2018-01-26: qty 10

## 2018-01-26 MED ORDER — ACETAMINOPHEN 325 MG PO TABS
650.0000 mg | ORAL_TABLET | ORAL | Status: DC | PRN
  Administered 2018-01-26: 650 mg via ORAL
  Filled 2018-01-26: qty 2

## 2018-01-26 MED ORDER — FLEET ENEMA 7-19 GM/118ML RE ENEM: 1.0000 | ENEMA | RECTAL | Status: DC | PRN

## 2018-01-26 MED ORDER — LACTATED RINGERS IV SOLN: 500.0000 mL | Freq: Once | INTRAVENOUS | Status: DC

## 2018-01-26 MED ORDER — ONDANSETRON HCL 4 MG/2ML IJ SOLN: 4.0000 mg | Freq: Four times a day (QID) | INTRAMUSCULAR | Status: DC | PRN

## 2018-01-26 MED ORDER — LACTATED RINGERS IV SOLN
500.0000 mL | INTRAVENOUS | Status: DC | PRN
  Administered 2018-01-26: 500 mL via INTRAVENOUS

## 2018-01-26 MED ORDER — OXYTOCIN 40 UNITS IN LACTATED RINGERS INFUSION - SIMPLE MED
2.5000 [IU]/h | INTRAVENOUS | Status: DC
  Filled 2018-01-26: qty 1000

## 2018-01-26 MED ORDER — LACTATED RINGERS IV SOLN
INTRAVENOUS | Status: DC
  Administered 2018-01-26 – 2018-01-27 (×3): via INTRAVENOUS

## 2018-01-26 MED ORDER — OXYCODONE-ACETAMINOPHEN 5-325 MG PO TABS: 2.0000 | ORAL_TABLET | ORAL | Status: DC | PRN

## 2018-01-26 MED ORDER — FENTANYL 2.5 MCG/ML BUPIVACAINE 1/10 % EPIDURAL INFUSION (WH - ANES)
14.0000 mL/h | INTRAMUSCULAR | Status: DC | PRN
  Administered 2018-01-26 – 2018-01-27 (×3): 14 mL/h via EPIDURAL
  Filled 2018-01-26 (×3): qty 100

## 2018-01-26 MED ORDER — LIDOCAINE HCL (PF) 1 % IJ SOLN
30.0000 mL | INTRAMUSCULAR | Status: DC | PRN
  Filled 2018-01-26: qty 30

## 2018-01-26 NOTE — MAU Note (Signed)
Electronic signature pad in room not functioning-patient and RN signed paper copy-placed in chart.

## 2018-01-26 NOTE — Plan of Care (Signed)
  Problem: Education: Goal: Knowledge of General Education information will improve Description Including pain rating scale, medication(s)/side effects and non-pharmacologic comfort measures Outcome: Progressing   Problem: Health Behavior/Discharge Planning: Goal: Ability to manage health-related needs will improve Outcome: Progressing   Problem: Clinical Measurements: Goal: Ability to maintain clinical measurements within normal limits will improve Outcome: Progressing Goal: Will remain free from infection Outcome: Progressing Goal: Diagnostic test results will improve Outcome: Progressing Goal: Respiratory complications will improve Outcome: Progressing Goal: Cardiovascular complication will be avoided Outcome: Progressing   Problem: Activity: Goal: Risk for activity intolerance will decrease Outcome: Progressing   Problem: Nutrition: Goal: Adequate nutrition will be maintained Outcome: Progressing   Problem: Coping: Goal: Level of anxiety will decrease Outcome: Progressing   Problem: Elimination: Goal: Will not experience complications related to bowel motility Outcome: Progressing Goal: Will not experience complications related to urinary retention Outcome: Progressing   Problem: Pain Managment: Goal: General experience of comfort will improve Outcome: Progressing   Problem: Safety: Goal: Ability to remain free from injury will improve Outcome: Progressing   Problem: Skin Integrity: Goal: Risk for impaired skin integrity will decrease Outcome: Progressing   Problem: Education: Goal: Knowledge of Childbirth will improve Outcome: Progressing Goal: Ability to make informed decisions regarding treatment and plan of care will improve Outcome: Progressing Goal: Ability to state and carry out methods to decrease the pain will improve Outcome: Progressing   Problem: Coping: Goal: Ability to verbalize concerns and feelings about labor and delivery will  improve Outcome: Progressing   Problem: Life Cycle: Goal: Ability to make normal progression through stages of labor will improve Outcome: Progressing Goal: Ability to effectively push during vaginal delivery will improve Outcome: Progressing   Problem: Role Relationship: Goal: Ability to demonstrate positive interaction with the child will improve Outcome: Progressing   Problem: Safety: Goal: Risk of complications during labor and delivery will decrease Outcome: Progressing   Problem: Pain Management: Goal: Relief or control of pain from uterine contractions will improve Outcome: Progressing   

## 2018-01-26 NOTE — Progress Notes (Signed)
Pt and family are requesting dr Dareen Pianoanderson come to bedside, states she can come see patient in 20-25 minutes

## 2018-01-26 NOTE — Progress Notes (Signed)
Patient ID: Janice CommonJasmine L White, female   DOB: 10/15/1990, 27 y.o.   MRN: [id number] Vitals:   01/26/18 2100 01/26/18 2117 01/26/18 2130 01/26/18 2201  BP: 112/69  120/75 107/76  Pulse: (!) 110  (!) 115 (!) 122  Resp: 18 18 18 19   Temp: 98.7 F (37.1 C)   99.3 F (37.4 C)  TempSrc: Oral   Oral  SpO2:       FHR stable UCs every 2 min Dilation: 8 Effacement (%): 90 Cervical Position: Anterior Station: 0 Presentation: Vertex Exam by:: Casey Burkitt. Hernandez, RN  Will continue observe

## 2018-01-26 NOTE — Discharge Instructions (Signed)

## 2018-01-26 NOTE — Progress Notes (Signed)
LABOR PROGRESS NOTE  Janice White is a 27 y.o. G1P0000 at 2039w1d  admitted for SOL  Subjective: Patient is not feeling any contractions at this time.  Objective: BP 120/71   Pulse 100   Temp 97.8 F (36.6 C) (Oral)   Resp 16   LMP 04/19/2017   SpO2 99%  or  Vitals:   01/26/18 1701 01/26/18 1730 01/26/18 1800 01/26/18 1830  BP: 121/74 126/72 110/63 120/71  Pulse: (!) 105 (!) 103 90 100  Resp: 16 16 16 16   Temp:  97.8 F (36.6 C)    TempSrc:  Oral    SpO2:        ~1830 Dilation: 4.5 Effacement (%): 90 Station: -2 Presentation: Vertex Exam by:: Dr. Marita KansasAnderson FHT: baseline rate 155, moderate varibility, 15x15 acel, none decel Toco: 2-4 minutes moderate  Labs: Lab Results  Component Value Date   WBC 9.1 01/26/2018   HGB 11.0 (L) 01/26/2018   HCT 34.5 (L) 01/26/2018   MCV 81.6 01/26/2018   PLT 214 01/26/2018    Patient Active Problem List   Diagnosis Date Noted  . Normal labor and delivery 01/26/2018  . Irregular menstrual cycle 06/09/2016    Assessment / Plan: 27 y.o. G1P0000 at 3739w1d here for SOL.  Labor: Patient cervix is progressing well. This exam showed 6cm/90%/0 station. Frequent contractions were felt during exam. Ruptured membranes at this time with clear fluids, vernix stained Fetal Wellbeing:  Category I Pain Control:  Epidural in place Anticipated MOD:  vaginal  Kwinton Maahs DO 01/26/2018, 6:45 PM

## 2018-01-26 NOTE — H&P (Addendum)
LABOR AND DELIVERY ADMISSION HISTORY AND PHYSICAL NOTE  Janice White is a 27 y.o. female G1P0000 with IUP at 3475w1d presenting for SOL. She reports positive fetal movement. She denies leakage of fluid or vaginal bleeding.  Prenatal History/Complications: PNC at HD Pregnancy complications:  - none  Past Medical History: Past Medical History:  Diagnosis Date  . Asthma   . PONV (postoperative nausea and vomiting)     Past Surgical History: Past Surgical History:  Procedure Laterality Date  . WISDOM TOOTH EXTRACTION      Obstetrical History: OB History    Gravida  1   Para  0   Term  0   Preterm  0   AB  0   Living  0     SAB  0   TAB  0   Ectopic  0   Multiple  0   Live Births              Social History: Social History   Socioeconomic History  . Marital status: Single    Spouse name: Not on file  . Number of children: Not on file  . Years of education: Not on file  . Highest education level: Not on file  Occupational History  . Not on file  Social Needs  . Financial resource strain: Not on file  . Food insecurity:    Worry: Not on file    Inability: Not on file  . Transportation needs:    Medical: Not on file    Non-medical: Not on file  Tobacco Use  . Smoking status: Never Smoker  . Smokeless tobacco: Never Used  Substance and Sexual Activity  . Alcohol use: No  . Drug use: No  . Sexual activity: Not Currently    Comment: pregnancy  Lifestyle  . Physical activity:    Days per week: Not on file    Minutes per session: Not on file  . Stress: Not on file  Relationships  . Social connections:    Talks on phone: Not on file    Gets together: Not on file    Attends religious service: Not on file    Active member of club or organization: Not on file    Attends meetings of clubs or organizations: Not on file    Relationship status: Not on file  Other Topics Concern  . Not on file  Social History Narrative  . Not on file     Family History: Family History  Problem Relation Age of Onset  . Diabetes Mother   . Hypertension Mother   . Multiple sclerosis Mother   . Heart disease Father   . Stroke Father   . Diabetes Maternal Grandmother     Allergies: Allergies  Allergen Reactions  . Penicillins Itching    Has patient had a PCN reaction causing immediate rash, facial/tongue/throat swelling, SOB or lightheadedness with hypotension: No Has patient had a PCN reaction causing severe rash involving mucus membranes or skin necrosis: No Has patient had a PCN reaction that required hospitalization No Has patient had a PCN reaction occurring within the last 10 years: Yes If all of the above answers are "NO", then may proceed with Cephalosporin use.     Medications Prior to Admission  Medication Sig Dispense Refill Last Dose  . diphenhydramine-acetaminophen (TYLENOL PM) 25-500 MG TABS tablet Take 1 tablet by mouth at bedtime as needed.   01/26/2018 at Unknown time  . Elastic Bandages & Supports (COMFORT FIT  MATERNITY SUPP SM) MISC 1 Units by Does not apply route daily as needed. 1 each 0 01/25/2018 at Unknown time  . Prenatal Vit-Fe Fumarate-FA (PRENATAL VITAMIN PO) Take by mouth.   01/25/2018 at Unknown time  . hydrOXYzine (VISTARIL) 50 MG capsule Take 1 capsule (50 mg total) by mouth 2 (two) times daily as needed. 10 capsule 0 More than a month at Unknown time  . ondansetron (ZOFRAN ODT) 4 MG disintegrating tablet Take 1 tablet (4 mg total) by mouth every 8 (eight) hours as needed for nausea or vomiting. (Patient not taking: Reported on 10/05/2017) 20 tablet 0 Not Taking at Unknown time     Review of Systems  All systems reviewed and negative except as stated in HPI  Physical Exam Blood pressure 114/75, pulse 80, temperature 98.2 F (36.8 C), temperature source Oral, last menstrual period 04/19/2017. General appearance: alert, oriented, NAD Lungs: normal respiratory effort Heart: regular rate Abdomen:  soft, non-tender; gravid, FH appropriate for GA Extremities: No calf swelling or tenderness Presentation: cephalic Fetal monitoring: category I Uterine activity: 3-4 min, moderate Dilation: 4 Effacement (%): 90 Station: -2 Exam by:: n druebbisch rn  Prenatal labs: ABO, Rh: --/--/A POS (08/27 2058) Antibody: NEG (04/11 0245) Rubella:  immune RPR:   non reactive HBsAg:   neg HIV:   non reactive GC/Chlamydia: neg GBS:   neg 2-hr GTT: neg Genetic screening:  negative Anatomy US: wnl  Prenatal Transfer Tool  Maternal Diabetes: No Genetic Screening: Normal Maternal Ultrasounds/Referrals: Normal Fetal Ultrasounds or other Referrals:  None Maternal Substance Abuse:  No Significant Maternal Medications:  None Significant Maternal Lab Results: None  No results found for this or any previous visit (from the past 24 hour(s)).  Patient Active Problem List   Diagnosis Date Noted  . Irregular menstrual cycle 06/09/2016    Assessment: Janice White is a 27 y.o. G1P0000 at [redacted]w[redacted]d here for SOL. Membranes intact.   #Labor: patient is moderately uncomfortable with contractions. She prefers to proceed naturally without augmentation at this time but is agreeable to using pitocin in the future #Pain: No pain medications planned #FWB: Category I, HR 140, positive accels, no decels #ID:  GBS neg #MOF: breast #MOC:undecided #Circ:  Yes, and undecided where at this time  Janice White 01/26/2018, 11:50 AM   Attestation: I have seen this patient and agree with the resident's documentation. I have examined them separately, and we have discussed the plan of care.  Janice White. Janice Plater, [facility] OB[facility] Fellow

## 2018-01-26 NOTE — MAU Note (Signed)
Pt reports to MAU via EMS c/o ctx every 2-543min. No bleeding or LOF. +FM.

## 2018-01-26 NOTE — Anesthesia Pain Management Evaluation Note (Signed)
  CRNA Pain Management Visit Note  Patient: Janice White, 27 y.o., female  "Hello I am a member of the anesthesia team at [facility]. We have an anesthesia team available at all times to provide care throughout the hospital, including epidural management and anesthesia for C-section. I don't know your plan for the delivery whether it a natural birth, water birth, IV sedation, nitrous supplementation, doula or epidural, but we want to meet your pain goals."   1.Was your pain managed to your expectations on prior hospitalizations?   No prior hospitalizations  2.What is your expectation for pain management during this hospitalization?     Epidural  3.How can we help you reach that goal?   Record the patient's initial score and the patient's pain goal.   Pain: 0  Pain Goal: 4 The [facility] wants you to be able to say your pain was always managed very well.  Laban EmperorMalinova,Lulani Bour Hristova 01/26/2018

## 2018-01-26 NOTE — Anesthesia Preprocedure Evaluation (Signed)
Anesthesia Evaluation  Patient identified by MRN, date of birth, ID band Patient awake    Reviewed: Allergy & Precautions, NPO status , Patient's Chart, lab work & pertinent test results  History of Anesthesia Complications (+) PONV  Airway Mallampati: II  TM Distance: >3 FB Neck ROM: Full    Dental no notable dental hx.    Pulmonary asthma ,    Pulmonary exam normal breath sounds clear to auscultation       Cardiovascular negative cardio ROS Normal cardiovascular exam Rhythm:Regular Rate:Normal     Neuro/Psych negative neurological ROS  negative psych ROS   GI/Hepatic negative GI ROS, Neg liver ROS,   Endo/Other  negative endocrine ROS  Renal/GU negative Renal ROS  negative genitourinary   Musculoskeletal negative musculoskeletal ROS (+)   Abdominal   Peds negative pediatric ROS (+)  Hematology negative hematology ROS (+)   Anesthesia Other Findings   Reproductive/Obstetrics (+) Pregnancy                             Anesthesia Physical Anesthesia Plan  ASA: II  Anesthesia Plan: Epidural   Post-op Pain Management:    Induction:   PONV Risk Score and Plan:   Airway Management Planned:   Additional Equipment:   Intra-op Plan:   Post-operative Plan:   Informed Consent:   Plan Discussed with:   Anesthesia Plan Comments:         Anesthesia Quick Evaluation

## 2018-01-26 NOTE — Anesthesia Procedure Notes (Signed)
Epidural Patient location during procedure: OB Start time: 01/26/2018 2:05 PM End time: 01/26/2018 2:18 PM  Staffing Anesthesiologist: Lowella CurbMiller, Pegeen Stiger Ray, MD Performed: anesthesiologist   Preanesthetic Checklist Completed: patient identified, site marked, surgical consent, pre-op evaluation, timeout performed, IV checked, risks and benefits discussed and monitors and equipment checked  Epidural Patient position: sitting Prep: ChloraPrep Patient monitoring: heart rate, cardiac monitor, continuous pulse ox and blood pressure Approach: midline Location: L2-L3 Injection technique: LOR saline  Needle:  Needle type: Tuohy  Needle gauge: 17 G Needle length: 9 cm Needle insertion depth: 5 cm Catheter type: closed end flexible Catheter size: 20 Guage Catheter at skin depth: 9 cm Test dose: negative  Assessment Events: blood not aspirated, injection not painful, no injection resistance, negative IV test and no paresthesia  Additional Notes Reason for block:procedure for pain

## 2018-01-27 ENCOUNTER — Encounter (HOSPITAL_COMMUNITY): Payer: Self-pay

## 2018-01-27 DIAGNOSIS — O480 Post-term pregnancy: Secondary | ICD-10-CM

## 2018-01-27 DIAGNOSIS — Z3A40 40 weeks gestation of pregnancy: Secondary | ICD-10-CM

## 2018-01-27 LAB — RPR: RPR Ser Ql: NONREACTIVE

## 2018-01-27 MED ORDER — COCONUT OIL OIL: 1.0000 "application " | TOPICAL_OIL | Status: DC | PRN

## 2018-01-27 MED ORDER — IBUPROFEN 600 MG PO TABS
600.0000 mg | ORAL_TABLET | Freq: Four times a day (QID) | ORAL | Status: DC
  Administered 2018-01-27 – 2018-01-29 (×9): 600 mg via ORAL
  Filled 2018-01-27 (×9): qty 1

## 2018-01-27 MED ORDER — DIBUCAINE 1 % RE OINT: 1.0000 "application " | TOPICAL_OINTMENT | RECTAL | Status: DC | PRN

## 2018-01-27 MED ORDER — OXYTOCIN 40 UNITS IN LACTATED RINGERS INFUSION - SIMPLE MED: 2.5000 [IU]/h | INTRAVENOUS | Status: DC | PRN

## 2018-01-27 MED ORDER — SIMETHICONE 80 MG PO CHEW: 80.0000 mg | CHEWABLE_TABLET | ORAL | Status: DC | PRN

## 2018-01-27 MED ORDER — DIPHENHYDRAMINE HCL 25 MG PO CAPS: 25.0000 mg | ORAL_CAPSULE | Freq: Four times a day (QID) | ORAL | Status: DC | PRN

## 2018-01-27 MED ORDER — ONDANSETRON HCL 4 MG PO TABS: 4.0000 mg | ORAL_TABLET | ORAL | Status: DC | PRN

## 2018-01-27 MED ORDER — BENZOCAINE-MENTHOL 20-0.5 % EX AERO
1.0000 "application " | INHALATION_SPRAY | CUTANEOUS | Status: DC | PRN
  Administered 2018-01-27: 1 via TOPICAL
  Filled 2018-01-27: qty 56

## 2018-01-27 MED ORDER — CLINDAMYCIN PHOSPHATE 600 MG/50ML IV SOLN: 600.0000 mg | Freq: Four times a day (QID) | INTRAVENOUS | Status: DC

## 2018-01-27 MED ORDER — WITCH HAZEL-GLYCERIN EX PADS: 1.0000 "application " | MEDICATED_PAD | CUTANEOUS | Status: DC | PRN

## 2018-01-27 MED ORDER — SENNOSIDES-DOCUSATE SODIUM 8.6-50 MG PO TABS
2.0000 | ORAL_TABLET | ORAL | Status: DC
  Administered 2018-01-27 – 2018-01-28 (×2): 2 via ORAL
  Filled 2018-01-27 (×2): qty 2

## 2018-01-27 MED ORDER — ZOLPIDEM TARTRATE 5 MG PO TABS: 5.0000 mg | ORAL_TABLET | Freq: Every evening | ORAL | Status: DC | PRN

## 2018-01-27 MED ORDER — ONDANSETRON HCL 4 MG/2ML IJ SOLN: 4.0000 mg | INTRAMUSCULAR | Status: DC | PRN

## 2018-01-27 MED ORDER — GENTAMICIN SULFATE 40 MG/ML IJ SOLN
130.0000 mg | Freq: Three times a day (TID) | INTRAVENOUS | Status: DC
  Administered 2018-01-27 (×3): 130 mg via INTRAVENOUS
  Filled 2018-01-27 (×6): qty 3.25

## 2018-01-27 MED ORDER — ACETAMINOPHEN 325 MG PO TABS: 650.0000 mg | ORAL_TABLET | ORAL | Status: DC | PRN

## 2018-01-27 MED ORDER — PRENATAL MULTIVITAMIN CH
1.0000 | ORAL_TABLET | Freq: Every day | ORAL | Status: DC
  Administered 2018-01-27 – 2018-01-29 (×3): 1 via ORAL
  Filled 2018-01-27 (×3): qty 1

## 2018-01-27 NOTE — Lactation Note (Signed)
This note was copied from a baby's chart. Lactation Consultation Note  Patient Name: Boy Quintella ReichertJasmine Digioia ZOXWR'UToday's Date: 01/27/2018 Reason for consult: Initial assessment;Primapara;Term  Visited with P1 Mom of term baby at 209 hrs old.  Baby was a Code Apgars 2, 7 & 9.  No respiratory effort at birth.    Baby hasn't latched yet and Mom was asking her RN about giving baby formula.    Mom taught breast massage and hand expression, drop of colostrum expressed.  Mom encouraged to continue periodically.  Baby swaddled and held by friend in room.   Talked about the importance of keeping baby STS until baby is breastfeeding regularly.  Mom on her phone, and made no attempt to place baby on her chest.  Explained the benefits of STS in the early days of life.  Talked about medical indications for supplementing with formula.  Mom seemed reassured that formula isn't needed at this time.  Mom encouraged to keep baby STS and watch for feeding cues.  To call for her RN to assist with latch prn.  Talked about importance of a deep latch to the breast.    Lactation brochure left with Mom.  Mom aware of IP and OP lactation support available.     Consult Status Consult Status: Follow-up Date: 01/28/18 Follow-up type: In-patient    Judee ClaraSmith, Sury Wentworth E 01/27/2018, 2:35 PM

## 2018-01-27 NOTE — Addendum Note (Signed)
Addendum  created 01/27/18 1452 by Junious SilkGilbert, Damyia Strider, CRNA   Clinical Note Signed

## 2018-01-27 NOTE — Progress Notes (Signed)
Notified by RN that patient is completely dilated  Is very uncomfortable and scared to push  Will let her labor down a while and then start pushing  AVSS  FHR reassuring UCs every 2-3 min

## 2018-01-27 NOTE — Progress Notes (Signed)
Notified by RN that temp is now elevated at 100.3 I was delivering in another room and later entered orders for antibiotics VSS  FHR reassuring UCs every 2 min  Pushing now  Will start Gent and Clinda since pt is allergic to PCN  Anticipate SVD

## 2018-01-27 NOTE — Anesthesia Postprocedure Evaluation (Signed)
Anesthesia Post Note  Patient: Janice White  Procedure(s) Performed: AN AD HOC LABOR EPIDURAL     Patient location during evaluation: Mother Baby Anesthesia Type: Epidural Level of consciousness: awake and alert Pain management: pain level controlled Vital Signs Assessment: post-procedure vital signs reviewed and stable Respiratory status: spontaneous breathing, nonlabored ventilation and respiratory function stable Cardiovascular status: stable Postop Assessment: no headache, no backache and epidural receding Anesthetic complications: no    Last Vitals:  Vitals:   01/27/18 0943 01/27/18 1329  BP: 113/69 129/81  Pulse: 86 81  Resp: 17 18  Temp: 36.8 C 36.6 C  SpO2:      Last Pain:  Vitals:   01/27/18 1330  TempSrc:   PainSc: 0-No pain   Pain Goal: Patients Stated Pain Goal: 2 (01/26/18 2010)               Junious SilkGILBERT,[name]

## 2018-01-27 NOTE — Discharge Summary (Addendum)
Postpartum Discharge Summary     Patient Name: Janice White DOB: Mar 21, 1990 MRN: [id number]  Date of admission: 01/26/2018 Delivering Provider: Penny PiaPEIFFER, SARAH A   Date of discharge: 01/29/2018  Admitting diagnosis: 40WKS CTX Intrauterine pregnancy: 4924w2d     Secondary diagnosis:  Active Problems:   Normal labor and delivery   Postpartum care following vaginal delivery  Additional problems: None     Discharge diagnosis: Term Pregnancy Delivered                                                                                                Post partum procedures:None  Augmentation: None  Complications: None  Hospital course:  Onset of Labor With Vaginal Delivery     27 y.o. yo G1P1001 at 2924w2d was admitted in Active Labor on 01/26/2018. Patient had an uncomplicated labor course as follows:  Membrane Rupture Time/Date: 6:36 PM ,01/26/2018   Intrapartum Procedures: Episiotomy: None [1]                                         Lacerations:  1st degree [2];Perineal [11]  Patient had a delivery of a Viable infant. Due to poor tone and lack of spontaneous respiration at delivery, pediatric team was called to evaluate infant. Infant had spontaneous respirations after brief NRP with bag mask. 01/27/2018  Information for the patient's newborn:  Erskin Burneteely, Boy Rudolph [[id number]][[id number]]  Delivery Method: Vag-Spont    Pateint had an uncomplicated postpartum course.  She is ambulating, tolerating a regular diet, passing flatus, and urinating well. Patient is discharged home in stable condition on 01/29/18.   Magnesium Sulfate recieved: No BMZ received: No  Physical exam  Vitals:   01/28/18 0539 01/28/18 1457 01/28/18 2251 01/29/18 0515  BP: 105/65 (!) 113/97 111/78 123/73  Pulse: 80 89 82 97  Resp: 18   18  Temp: 97.9 F (36.6 C) 98 F (36.7 C) (!) 97.3 F (36.3 C) 98 F (36.7 C)  TempSrc: Oral Oral Oral Oral  SpO2: 99% 100%  100%  Weight:      Height:       General: alert,  cooperative and no distress Lochia: appropriate Uterine Fundus: firm Incision: N/A DVT Evaluation: No evidence of DVT seen on physical exam. Negative Homan's sign. No cords or calf tenderness. No significant calf/ankle edema. Labs: Lab Results  Component Value Date   WBC 9.1 01/26/2018   HGB 11.0 (L) 01/26/2018   HCT 34.5 (L) 01/26/2018   MCV 81.6 01/26/2018   PLT 214 01/26/2018   CMP Latest Ref Rng & Units 11/11/2017  Glucose 70 - 99 mg/dL 78(G65(L)  BUN 6 - 20 mg/dL 6  Creatinine 9.560.44 - 2.131.00 mg/dL 0.860.46  Sodium 578135 - 469145 mmol/L 135  Potassium 3.5 - 5.1 mmol/L 4.0  Chloride 98 - 111 mmol/L 104  CO2 22 - 32 mmol/L 21(L)  Calcium 8.9 - 10.3 mg/dL 9.3  Total Protein 6.5 - 8.1 g/dL -  Total Bilirubin 0.3 - 1.2 mg/dL -  Alkaline Phos 38 - 126 U/L -  AST 15 - 41 U/L -  ALT 0 - 44 U/L -    Discharge instruction: per After Visit Summary and "Baby and Me Booklet".  After visit meds:  Allergies as of 01/29/2018      Reactions   Penicillins Itching   Has patient had a PCN reaction causing immediate rash, facial/tongue/throat swelling, SOB or lightheadedness with hypotension: No Has patient had a PCN reaction causing severe rash involving mucus membranes or skin necrosis: No Has patient had a PCN reaction that required hospitalization No Has patient had a PCN reaction occurring within the last 10 years: Yes If all of the above answers are "NO", then may proceed with Cephalosporin use.      Medication List    STOP taking these medications   COMFORT FIT MATERNITY SUPP SM Misc   diphenhydramine-acetaminophen 25-500 MG Tabs tablet Commonly known as:  TYLENOL PM   hydrOXYzine 50 MG capsule Commonly known as:  VISTARIL   ondansetron 4 MG disintegrating tablet Commonly known as:  ZOFRAN ODT     TAKE these medications   ibuprofen 600 MG tablet Commonly known as:  ADVIL,MOTRIN Take 1 tablet (600 mg total) by mouth every 6 (six) hours.   PRENATAL VITAMIN PO Take by mouth.    senna-docusate 8.6-50 MG tablet Commonly known as:  Senokot-S Take 2 tablets by mouth daily for 7 days.       Diet: routine diet  Activity: Advance as tolerated. Pelvic rest for 6 weeks.   Outpatient follow up:4 weeks Follow up Appt:No future appointments. Follow up Visit:   Please schedule this patient for Postpartum visit in: 4 weeks with the following provider: Any provider For C/S patients schedule nurse incision check in weeks 2 weeks: no Low risk pregnancy complicated by: None Delivery mode:  SVD Anticipated Birth Control:  IUD PP Procedures needed: none  Schedule Integrated BH visit: yes - stressors with mother at home with MS, risk for depression and anxiety  Newborn Data: Live born female  Birth Weight: 7 lb 4 oz (3289 g) APGAR: 2, 7  Newborn Delivery   Time head delivered:  01/27/2018 05:33:24 Birth date/time:  01/27/2018 05:34:00 Delivery type:  Vaginal, Spontaneous     Baby Feeding: Breast Disposition:home with mother   01/29/2018 Awilda Metro, MD  OB FELLOW DISCHARGE ATTESTATION  I have seen and examined this patient and agree with above documentation in the resident's note.   Gwenevere Abbot, MD  OB Fellow  01/29/2018, 8:24 AM

## 2018-01-27 NOTE — Anesthesia Postprocedure Evaluation (Signed)
Anesthesia Post Note  Patient: Janice White  Procedure(s) Performed: AN AD HOC LABOR EPIDURAL     Patient location during evaluation: Mother Baby Anesthesia Type: Epidural Level of consciousness: awake and alert Pain management: pain level controlled Vital Signs Assessment: post-procedure vital signs reviewed and stable Respiratory status: spontaneous breathing, nonlabored ventilation and respiratory function stable Cardiovascular status: stable Postop Assessment: no headache, no backache and epidural receding Anesthetic complications: no    Last Vitals:  Vitals:   01/27/18 0500 01/27/18 0503  BP: 129/84 129/84  Pulse: (!) 138 (!) 138  Resp:  18  Temp:    SpO2:      Last Pain:  Vitals:   01/27/18 0358  TempSrc: Axillary  PainSc:    Pain Goal: Patients Stated Pain Goal: 2 (01/26/18 2010)               Isidra Mings

## 2018-01-27 NOTE — H&P (Signed)
Pharmacy Antibiotic Note  Janice CommonJasmine L White is a 27 y.o. G1P0000 admitted on 01/26/2018 with IUP at 6322w1d with SOL. Patient now has an increased temperature in labor.  Pharmacy has been consulted for Gentamicin dosing.  Plan: Gentamicin 130 mg IV every 8 hours for expected peak 6-8 mcg/ml and troughs <1 mcg/ml. Monitor serum creatinine every 3 days while patient is on gentamicin Serum gentamicin levels as Indicated.   Temp (24hrs), Avg:98.7 F (37.1 C), Min:97.8 F (36.6 C), Max:100.3 F (37.9 C)  Recent Labs  Lab 01/26/18 1157  WBC 9.1    CrCl cannot be calculated (Patient's most recent lab result is older than the maximum 21 days allowed.).    Allergies  Allergen Reactions  . Penicillins Itching    Has patient had a PCN reaction causing immediate rash, facial/tongue/throat swelling, SOB or lightheadedness with hypotension: No Has patient had a PCN reaction causing severe rash involving mucus membranes or skin necrosis: No Has patient had a PCN reaction that required hospitalization No Has patient had a PCN reaction occurring within the last 10 years: Yes If all of the above answers are "NO", then may proceed with Cephalosporin use.     Antimicrobials this admission: Clindamycin 01/27/18>>  Dose adjustments this admission: N/A  Thank you for allowing pharmacy to be a part of this patient's care.  Arelia SneddonMason, Norwood Quezada Anne 01/27/2018 5:07 AM

## 2018-01-28 MED ORDER — IBUPROFEN 600 MG PO TABS: 600.0000 mg | ORAL_TABLET | Freq: Four times a day (QID) | ORAL | 0 refills | Status: DC

## 2018-01-28 MED ORDER — SENNOSIDES-DOCUSATE SODIUM 8.6-50 MG PO TABS: 2.0000 | ORAL_TABLET | ORAL | 0 refills | Status: DC

## 2018-01-28 NOTE — Progress Notes (Addendum)
Post Partum Day 1 Subjective: no complaints  Has questions about IUDs and Nexplanon.  Objective: Blood pressure 105/65, pulse 80, temperature 97.9 F (36.6 C), temperature source Oral, resp. rate 18, height 4\' 11"  (1.499 m), weight 62.6 kg, last menstrual period 04/19/2017, SpO2 99 %, unknown if currently breastfeeding.  Physical Exam:  General: alert, cooperative and no distress Lochia: appropriate Uterine Fundus: firm Incision: NA DVT Evaluation: No evidence of DVT seen on physical exam. Negative Homan's sign. No cords or calf tenderness. No significant calf/ankle edema.  Recent Labs    01/26/18 1157  HGB 11.0*  HCT 34.5*    Assessment/Plan: Plan for discharge tomorrow, Breastfeeding, Circumcision prior to discharge and Contraception considering IUD vs Nexplanon  Circ pending payment   LOS: 2 days   Janice White 01/28/2018, 8:06 AM

## 2018-01-28 NOTE — Progress Notes (Signed)
CSW attempted to meet with MOB again in room 113.  When CSW arrived, MOB was sound asleep.    CSW spoke with MOB's bedside nurse regarding the need for CSW to assess MOB.  Bedside nurse communicated that MOB is probably exhausted from the lack of sleep on yesterday (01/27/18).  Bedside nurse also reported that MGM has MS and was admitted to hospital on yesterday and that has been a stressor factor for MOB.   Weekend CSW will assess MOB prior to family's discharge.   Blaine HamperAngel Boyd-Gilyard, MSW, LCSW Clinical Social Work [phone]

## 2018-01-28 NOTE — Progress Notes (Signed)
CSW acknowledges consult.  At 10:50am CSW attempted to meet with MOB, however MOB was being attended to by lactation. CSW will attempt to visit with MOB at a later time.   At 12:00pm, CSW attempted to meet with MOB and MOB was sound asleep.   This will attempt to meet with MOB at a later time.   Blaine HamperAngel Boyd-Gilyard, MSW, LCSW Clinical Social Work [phone]

## 2018-01-28 NOTE — Lactation Note (Signed)
This note was copied from a baby's chart. Lactation Consultation Note  Patient Name: Janice Quintella ReichertJasmine White EXBMW'UToday's Date: 01/28/2018 Reason for consult: Follow-up assessment;Difficult latch;Term;Primapara;1st time breastfeeding  P1 mother whose infant is now 429 hours old.  Baby was awake when I entered but not showing feeding cues.  He has not breast fed or received any supplementation since birth.  I offered to assist with latching and mother accepted.  Suggested mother feed baby STS and try to awaken baby prior to latching.  He remains very sleepy but showed mother techniques to help awaken him.  Demonstrated hand expression and mother did a return demonstration.  She was able to obtain a drop from the right breast.    Mother's breasts are soft and non tender and nipples are short shafted bilaterally.  Her breast tissue is compressible. Mother positioned appropriately and attempted to latch in the football hold to the right breast.  Baby showed no interest in wanting to latch.  Once I was able to help him latch he did not suck at all.  Gentle stimulation did not promote sucking.  Burped baby and attempted a second time without success.  Suggested mother try cross cradle position on the left side.  Again, he latched with encouragement but refused to suck.  Reassured mother that he will wake up when he is ready to feed.  In the meantime I wanted her to begin pumping with the DEBP.  Mother willing to do this.  Initiated the DEBP: reviewed pump set up, assembly, disassembly and cleaning of parts.  Flange size #24 is appropriate at this time.  RN at bedside and aware of plan.  Encouraged mother to also include hand expression before/after pumping and to feed back any EBM she may obtain to baby.  Colostrum containers at bedside.  Mother will call for latch assistance at the next feeding.    Mother has no support person here now but believes her sister will be coming today.   Maternal Data Formula Feeding  for Exclusion: No Has patient been taught Hand Expression?: Yes Does the patient have breastfeeding experience prior to this delivery?: No  Feeding Feeding Type: Breast Fed  LATCH Score Latch: Too sleepy or reluctant, no latch achieved, no sucking elicited.  Audible Swallowing: None  Type of Nipple: Everted at rest and after stimulation(short shafted bilaterally)  Comfort (Breast/Nipple): Soft / non-tender  Hold (Positioning): Assistance needed to correctly position infant at breast and maintain latch.  LATCH Score: 5  Interventions Interventions: Breast feeding basics reviewed;Assisted with latch;Skin to skin;Breast massage;Hand express;Breast compression;Position options;Support pillows;Adjust position;DEBP  Lactation Tools Discussed/Used Tools: Pump   Consult Status Consult Status: Follow-up Date: 01/29/18 Follow-up type: In-patient    Chitara Clonch R Avonte Sensabaugh 01/28/2018, 11:24 AM

## 2018-01-29 ENCOUNTER — Encounter (HOSPITAL_COMMUNITY): Payer: Self-pay | Admitting: *Deleted

## 2018-01-29 MED ORDER — IBUPROFEN 600 MG PO TABS: 600.0000 mg | ORAL_TABLET | Freq: Four times a day (QID) | ORAL | 0 refills | Status: DC

## 2018-01-29 MED ORDER — SENNOSIDES-DOCUSATE SODIUM 8.6-50 MG PO TABS: 2.0000 | ORAL_TABLET | ORAL | 0 refills | Status: AC

## 2018-01-29 NOTE — Lactation Note (Signed)
This note was copied from a baby's chart. Lactation Consultation Note  Patient Name: Janice White ZOXWR'UToday's Date: 01/29/2018 Reason for consult: Follow-up assessment;Difficult latch Baby is 2853 hours old and not latching.  Mom is pumping and giving baby expressed milk and formula as supplement.  Offered assist with feeding and mom agreeable.  Baby placed skin to skin in football hold.  Nipples flat and baby unable to grasp tissue.  20 mm nipple shield applied and baby latched easily and actively sucked for 10 minutes.  No colostrum seen in shield but mom recently pumped 10 mls.  Discussed WIC loaner and mom will decide if she would like one for discharge.  Lactation outpatient services and support reviewed and encouraged prn.  Maternal Data    Feeding Feeding Type: Breast Fed Nipple Type: Slow - flow  LATCH Score Latch: Grasps breast easily, tongue down, lips flanged, rhythmical sucking.  Audible Swallowing: A few with stimulation  Type of Nipple: Flat  Comfort (Breast/Nipple): Soft / non-tender  Hold (Positioning): Assistance needed to correctly position infant at breast and maintain latch.  LATCH Score: 7  Interventions Interventions: Assisted with latch;Skin to skin;Breast massage;Breast compression;Adjust position;Support pillows;DEBP  Lactation Tools Discussed/Used Tools: Nipple Shields Nipple shield size: 20   Consult Status Consult Status: Complete    Parthena Fergeson S 01/29/2018, 11:11 AM

## 2018-01-29 NOTE — Clinical Social Work Maternal (Signed)
CLINICAL SOCIAL WORK MATERNAL/CHILD NOTE  Patient Details  Name: Janice White MRN: [id number] Date of Birth: 12-Feb-1991  Date:  01/29/2018  Clinical Social Worker Initiating Note:  Marcelino DusterMichelle Barrett-Hilton Date/Time: Initiated:  01/29/18/1000     Child's Name:  Zipporah PlantsJaylen Weathersby    Biological Parents:  Mother   Need for Interpreter:  None   Reason for Referral:  Behavioral Health Concerns   Address[place]  [address] [place] [id number]    Phone number:  [phone] (home)     Additional phone number:  Household Members/Support Persons (HM/SP):   Household Member/Support Person 1, Household Member/Support Person 2   HM/SP Name Relationship DOB or Age  HM/SP -1 maternal grandmother       HM/SP -2 mother's nephew   589  HM/SP -3        HM/SP -4        HM/SP -5        HM/SP -6        HM/SP -7        HM/SP -8          Natural Supports (not living in the home):  Extended Family, Garment/textile technologistCommunity, Friends   Herbalistrofessional Supports: None   Employment: Unemployed   Type of Work:   LawyerCNA, Photographerretail   Education:      Homebound arranged:    Surveyor, quantityinancial Resources:  Medicaid   Other Resources:  [facility]   Cultural/Religious Considerations Which May Impact Care:  none  Strengths:  Ability to meet basic needs , Home prepared for child , Pediatrician chosen   Psychotropic Medications:     None     [place]  Pediatrician List:   [facility] [place] [facility]  [place]    [place]    [place] [facility]    [place]    [place]      Pediatrician Fax Number:    Risk Factors/Current Problems:  Family/Relationship Issues    Cognitive State:  Alert    Mood/Affect:  Calm    CSW Assessment: CSW consulted for this mother with high Edinburgh scores, concerns for depression. CSW spoke with nurse who reports mother doing well with infant, but has expressed some sadness. CSW introduced self and explained role of social work. Mother  was open and friendly.   Mother lives with her mother and 26 year old nephew. Large network of extended family and friend support. As CSW concluding visit, mother's two sisters arrived. Maternal grandmother has MS and is wheelchair bound. Mother assists in her care, grandmother also has support of a home nurse aide. Mother was working as a LawyerCNA during pregnancy, but Community education officerstates physicality of job became too demanding so recently stopped working. Mother states she has previously worked for [employer] and plans to return there after released to work. Mother receives [facility], states she does not currently have food stamps, but plans to apply. Mother was observed to be loving and attentive in her interactions with infant. Mother states that father of the baby is supportive and involved, though they have "some things to work out between us."  CSW spoke with mother to further assess concerns regarding mood as mother with high score on her [place] screen. Mother endorses past episodes of mild depressed mood, described as "nothing to point I needed professional help, always just reached out to my supports." Mother states her sadness now tied to her worries and hurt regarding her own mother's health. Mother states that her mother was  in the ED and was not able to be present for baby's delivery. Mother states that her mother was also hospitalized and missed mother's baby shower. Mother processing her feelings of "still wanting and needing my Mom and seeing the things she can't and won't be able to do for her grandchild and for me." CSW offered emotional support. Mother stated she would be open to support, but feels would be most helpful if resources were specific to MS. CSW provided mother with contact information for local MS support group for family and caregivers. CSW also provided mother with information regarding mother's support group here at [facility]. Mother expressed appreciation for these resources. CSW also  discussed signs and symptoms of post partum depression with mother as well as viable treatments and ways to access care and support. Mother verbalized understanding. Mother states she has all needed supplies for patient (room full of many baby items).   Mother with strong network of friend and family support. Appropriate resources shared and emotional support provided. No further needs expressed. No barriers to discharge.   CSW Plan/Description:  Other Restaurant manager, fast food, Other Information/Referral to [facility]    Carie Caddy     [phone] 01/29/2018, 11:57 AM

## 2018-02-01 ENCOUNTER — Encounter: Payer: Self-pay | Admitting: Advanced Practice Midwife

## 2018-02-01 DIAGNOSIS — O411290 Chorioamnionitis, unspecified trimester, not applicable or unspecified: Secondary | ICD-10-CM | POA: Insufficient documentation

## 2018-03-08 ENCOUNTER — Ambulatory Visit: Payer: Self-pay

## 2018-03-08 NOTE — Lactation Note (Signed)
This note was copied from a baby's chart. 03/08/2018  Name: Janice White MRN: [id number] Date of Birth: 01/27/2017 Gestational Age: Gestational Age: [redacted]w[redacted]d Birth Weight: 116 oz Weight today:    9 pounds 1 ounce (4110 grams) with clean size 46 diaper   42 week old infant presents today with mom and maternal aunt for feeding assessment. Mom is concerned infant is not eating well or gaining well.    Infant has gained 1051 grams in the last 38 days with an average daily weight gain of 28 grams a day.   Mom reports infant feeds at the breast frequently. Infant self awakens to feed and usually feeds on both breasts. Infant sleeps 7-8 hours at night, mom is pumping 2 x during the night, enc her to continue to protect her milk supply.   Mom reports infant seems to be using her as a pacifier. Mom reports some pain with initial latch that improves with feedings. Mom reports she uses the Nipple Shield with about 2 feedings a day. Mom offers infant 3-4 bottles a day, especially at night. Mom is pumping about 3 x a day with a Hygeia pump and gets 3-6 ounces per pumping.   Mom is aware that she is to wean off the Nipple shield with feedings as able. Infant feeds using a Tommie Tippee or Nuk nipple. Mom reports he paces himself and does not have drooling or choking on the bottles.   Infant latched to the left breast and fed for 10 minutes. Infant pulled off about 1/2 way through. Mom was ready to let him stop, enc mom to relatch infant. Infant did latch back on and fed longer. Infant then latched to the right breast and fed for about 5 minutes and mom was ready to quit again but infant wanted to continue to feed. Enc mom to put him back on the breast. Infant with wet burp and mom though he has overfed, discussed some wet burps are normal for infants.   Mom was shown tongue and lip restrictions and how they can effect milk supply and BF. Website information and local provider information given.   Infant to  follow up with Dr. Hyacinth Meeker on 2/18. Family connects has been out to see infant with no plans to follow up at this time. Mom aware of BF Support Groups. Infant to follow up with Lactation as needed. Mom aware to follow up if tongue and lip released. Infant to follow up with [facility] soon also.   Mom reports she had a difficult time a few days after infant was born with depression. She was scared to be alone and had a friend come and stay with her. She reports she is feeling better now although it comes and goes. Mom has an appointment to discuss with her provider. She feels she is able to care for infant and has a lot of support with infant. She was prescribed antidepressants but she is not taking them.   Mom reports questions have been answered. Mom to call with questions/concerns as needed.    General Information: Mother's reason for visit: Feeding assessment Consult: Initial Lactation consultant: Noralee Stain RN,IBCLC Breastfeeding experience: Eats frequently during the day and sleeps 7-8 hours at night Maternal medical conditions: History post partum depression(see note) Maternal medications: Pre-natal vitamin  Breastfeeding History: Frequency of breast feeding: every 1-3 hours during the day Duration of feeding: few minutes each side  Supplementation: Supplement method: bottle(Tommie Tippee, Nuk)  Breast milk volume: 3-4 ounces Breast milk frequency: 3-4 x a day Total breast milk volume per day: 9-16 ounces a day Pump type: Other(Hygeia DEBP) Pump frequency: 3 x a day Pump volume: 3-6 ounces  Infant Output Assessment: Voids per 24 hours: 7 Urine color: Clear yellow Stools per 24 hours: 3 Stool color: Yellow  Breast Assessment: Breast: Soft, Compressible Nipple: Flat(everts with stimulation)   Pain interventions: Bra, Coconut oil, Expressed breast milk(Cocoa Butter, enc not to put directly on the nipples)  Feeding Assessment: Infant oral assessment: Variance Infant  oral assessment comment: Infant with thick labial frenulum that inserts at the bottom of the gum ridge. Upper lip flange well. Infant will sometimes need flanging on the breast. Mom's nipples are flat at rest, they do every once infant feeds and are rounded post feeding. Mom does reports pain with some feedings. She is using the NS with 1-2 feeds a day. Infant noted to have short posterior lingual frenulum. Infant with good tongue extension and lateralization. Infant with some decreased mid tongue elevation. Infant with chomping on the breast with latching. Mom has times when nipples are painful.  mom is noted to have gap between her teeth and lingual frenulum also.  Positioning: Football(left breast, 10 minutes) Latch: 1 - Repeated attempts needed to sustain latch, nipple held in mouth throughout feeding, stimulation needed to elicit sucking reflex. Audible swallowing: 2 - Spontaneous and intermittent Type of nipple: 2 - Everted at rest and after stimulation Comfort: 2 - Soft/non-tender Hold: 2 - No assistance needed to correctly position infant at breast LATCH score: 9 Latch assessment: Deep Lips flanged: Yes Suck assessment: Displays both   Pre-feed weight: 4110 grams Post feed weight: 4146 grams Amount transferred: 36 ml Amount supplemented: 0  Additional Feeding Assessment: Infant oral assessment: Variance Infant oral assessment comment: see above Positioning: Football(right breast, 10 minutes) Latch: 2 - Grasps breast easily, tongue down, lips flanged, rhythmical sucking. Audible swallowing: 2 - Spontaneous and intermittent Type of nipple: 2 - Everted at rest and after stimulation Comfort: 2 - Soft/non-tender Hold: 2 - No assistance needed to correctly position infant at breast LATCH score: 10 Latch assessment: Deep Lips flanged: Yes Suck assessment: Displays both   Pre-feed weight: 4146 grams Post feed weight: 4154 grams Amount transferred: 8 ml Amount supplemented:  0  Totals: Total amount transferred: 44 ml Total supplement given: infant got back on and BF more after diaper change   Plan:  1. Offer infant the breast with feeding cues 2. Allow infant to finish the first breast before offering the second breast 3. Relatch infant if needed due to pain 4. Continue to pump at night when infant is not feeding with at least 3-4 pumpings a day 5. Offer infant a bottle after breast feeding if he is still cueing to feed or when infant is not latching to the breast for a feeding 6. Infant needs about 77-103 ml (2.5-3.5 ounces) for 8 feedings a day or 615-820 ml (21-27 ounces) in 24 hours. Feed infant until he is satisfied. 7. Feed infant using the paced bottle feeding method (video on [email or web address].[email or web address]) 8. Consider having infant evaluated by Oral Specialist if your nipple pain continues or infant not gaining well 9. Keep up the good work 10. Thank you for allowing me to assist you today  11. Please call with any questions/concerns as needed [phone] 12. Follow up with Lactation as needed or 1-5 days after tongue and lip release if completed  Silas FloodSharon S  Rosalio Catterton RN, IBCLC                                                     Silas FloodSharon S Ollen Rao 03/08/2018, 2:03 PM

## 2018-11-03 ENCOUNTER — Other Ambulatory Visit: Payer: Self-pay

## 2018-11-03 ENCOUNTER — Ambulatory Visit (HOSPITAL_COMMUNITY)
Admission: EM | Admit: 2018-11-03 | Discharge: 2018-11-03 | Disposition: A | Discharge status: Home / Self Care | Payer: Medicaid Other | Attending: Family Medicine | Admitting: Family Medicine

## 2018-11-03 ENCOUNTER — Encounter (HOSPITAL_COMMUNITY): Payer: Self-pay | Admitting: Family Medicine

## 2018-11-03 DIAGNOSIS — R1012 Left upper quadrant pain: Secondary | ICD-10-CM

## 2018-11-03 DIAGNOSIS — Z3202 Encounter for pregnancy test, result negative: Secondary | ICD-10-CM

## 2018-11-03 LAB — POCT PREGNANCY, URINE: Preg Test, Ur: NEGATIVE

## 2018-11-03 LAB — POCT URINALYSIS DIP (DEVICE)
Bilirubin Urine: NEGATIVE
Glucose, UA: NEGATIVE mg/dL
Hgb urine dipstick: NEGATIVE
Ketones, ur: NEGATIVE mg/dL
Leukocytes,Ua: NEGATIVE
Nitrite: NEGATIVE
Protein, ur: NEGATIVE mg/dL
Specific Gravity, Urine: >=1.03 (ref 1.005–1.030)
Urobilinogen, UA: 0.2 mg/dL (ref 0.0–1.0)
pH: 6 (ref 5.0–8.0)

## 2018-11-03 NOTE — Discharge Instructions (Addendum)
Try Colace 100 mg daily over the counter  If you are not better by Monday, please return.  If you do not have a period by early October, go to the Women's part of the hospital for an ultrasound.  (across from [address] on [address])

## 2018-11-03 NOTE — ED Triage Notes (Signed)
Pt c/o L sided flank pain for the last couple of days, non tender. No n/v/d. No problems urination. No vaginal issues. Pt states she thinks her period is also late.

## 2018-11-03 NOTE — ED Provider Notes (Signed)
[facility]    CSN: [id number] Arrival date & time: 11/03/18  1301      History   Chief Complaint Chief Complaint  Patient presents with  . Flank Pain    HPI Janice White is a 28 y.o. female.   Is a 28 year old woman who complains of abdominal pain.  She is an established [facility] patient.  Pt c/o L sided flank pain for the last couple of days, non tender. No n/v/d. No problems urination. No vaginal issues. Pt states she thinks her period is also late.   She did not have a period this month (usually around the 8th or 10th)     Past Medical History:  Diagnosis Date  . Asthma   . PONV (postoperative nausea and vomiting)     Patient Active Problem List   Diagnosis Date Noted  . 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 Postpartum care following vaginal delivery 01/28/2018  . Normal labor and delivery 01/26/2018  . Irregular menstrual cycle 06/09/2016    Past Surgical History:  Procedure Laterality Date  . WISDOM TOOTH EXTRACTION      OB History    Gravida  1   Para  1   Term  1   Preterm  0   AB  0   Living  1     SAB  0   TAB  0   Ectopic  0   Multiple  0   Live Births  1            Home Medications    Prior to Admission medications   Medication Sig Start Date End Date Taking? Authorizing Provider  ibuprofen (ADVIL,MOTRIN) 600 MG tablet Take 1 tablet (600 mg total) by mouth every 6 (six) hours. 01/29/18   Arvilla MarketWallace, Catherine Lauren, DO  Prenatal Vit-Fe Fumarate-FA (PRENATAL VITAMIN PO) Take by mouth.    [provider]    Family History Family History  Problem Relation Age of Onset  . Diabetes Mother   . Hypertension Mother   . Multiple sclerosis Mother   . Heart disease Father   . Stroke Father   . Diabetes Maternal Grandmother     Social History Social History   Tobacco Use  . Smoking status: Never Smoker  . Smokeless tobacco: Never Used  Substance Use Topics  . Alcohol use: No  . Drug  use: No     Allergies   Penicillins   Review of Systems Review of Systems  Gastrointestinal: Positive for abdominal pain.  Genitourinary: Positive for menstrual problem.  All other systems reviewed and are negative.    Physical Exam Triage Vital Signs ED Triage Vitals  Enc Vitals Group     BP      Pulse      Resp      Temp      Temp src      SpO2      Weight      Height      Head Circumference      Peak Flow      Pain Score      Pain Loc      Pain Edu?      Excl. in GC?    No data found.  Updated Vital Signs BP (!) 142/69   Pulse 91   Temp 98 F (36.7 C)   Resp 18   SpO2 100%   Breastfeeding Yes    Physical Exam Vitals signs and nursing note  reviewed.  Constitutional:      Appearance: Normal appearance.  HENT:     Head: Normocephalic.  Eyes:     Conjunctiva/sclera: Conjunctivae normal.  Neck:     Musculoskeletal: Normal range of motion and neck supple.  Cardiovascular:     Rate and Rhythm: Normal rate.  Pulmonary:     Effort: Pulmonary effort is normal.  Abdominal:     General: Bowel sounds are normal.     Palpations: There is no mass.     Tenderness: There is abdominal tenderness.     Comments: Mild LUQ tenderness  Musculoskeletal: Normal range of motion.  Skin:    General: Skin is warm and dry.  Neurological:     General: No focal deficit present.     Mental Status: She is alert and oriented to person, place, and time.  Psychiatric:        Mood and Affect: Mood normal.      UC Treatments / Results  Labs (all labs ordered are listed, but only abnormal results are displayed) Labs Reviewed  POCT URINALYSIS DIP (DEVICE)  POCT PREGNANCY, URINE    EKG   Radiology No results found.  Procedures Procedures (including critical care time)  Medications Ordered in UC Medications - No data to display  Initial Impression / Assessment and Plan / UC Course  I have reviewed the triage vital signs and the nursing notes.  Pertinent  labs & imaging results that were available during my care of the patient were reviewed by me and considered in my medical decision making (see chart for details).    Final Clinical Impressions(s) / UC Diagnoses   Final diagnoses:  LUQ abdominal pain     Discharge Instructions     Try Colace 100 mg daily over the counter  If you are not better by Monday, please return.  If you do not have a period by early October, go to the Women's part of the hospital for an ultrasound.  (across from [employer] on [address])    ED Prescriptions    None     I have reviewed the PDMP during this encounter.   Robyn Haber, MD 11/03/18 1340

## 2018-12-18 ENCOUNTER — Encounter (HOSPITAL_COMMUNITY): Payer: Self-pay | Admitting: *Deleted

## 2018-12-18 ENCOUNTER — Ambulatory Visit (HOSPITAL_COMMUNITY)
Admission: EM | Admit: 2018-12-18 | Discharge: 2018-12-18 | Disposition: A | Discharge status: Home / Self Care | Payer: Self-pay | Attending: Physician Assistant | Admitting: Physician Assistant

## 2018-12-18 ENCOUNTER — Other Ambulatory Visit: Payer: Self-pay

## 2018-12-18 DIAGNOSIS — Z3202 Encounter for pregnancy test, result negative: Secondary | ICD-10-CM

## 2018-12-18 DIAGNOSIS — R109 Unspecified abdominal pain: Secondary | ICD-10-CM

## 2018-12-18 LAB — POCT PREGNANCY, URINE: Preg Test, Ur: NEGATIVE

## 2018-12-18 MED ORDER — IBUPROFEN 800 MG PO TABS: 800.0000 mg | ORAL_TABLET | Freq: Three times a day (TID) | ORAL | 0 refills | Status: DC

## 2018-12-18 NOTE — Discharge Instructions (Signed)
Take medication as prescribed. Follow up with PCP for further evaluation.

## 2018-12-18 NOTE — ED Provider Notes (Signed)
[place]    CSN: [id number] Arrival date & time: 12/18/18  1019      History   Chief Complaint Chief Complaint  Patient presents with  . Abdominal Pain    HPI Janice White is a 28 y.o. female.   Patient here concerned with L sided flank pain x 2 months, was seen here on 9/17 for same, previous chart reviewed.  Pain remains unchanged since that visit.  She states ibuprofen and tylenol have not helped.  She denies fever, chills, nausea, vomiting, diarrhea, abdominal pain, back pain, GU sx.  Pain is constant, 5/10 on pain scale, sharp, and does not radiate.       Past Medical History:  Diagnosis Date  . Asthma   . PONV (postoperative nausea and vomiting)     Patient Active Problem List   Diagnosis Date Noted  . 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 Postpartum care following vaginal delivery 01/28/2018  . Normal labor and delivery 01/26/2018  . Irregular menstrual cycle 06/09/2016    Past Surgical History:  Procedure Laterality Date  . WISDOM TOOTH EXTRACTION      OB History    Gravida  1   Para  1   Term  1   Preterm  0   AB  0   Living  1     SAB  0   TAB  0   Ectopic  0   Multiple  0   Live Births  1            Home Medications    Prior to Admission medications   Medication Sig Start Date End Date Taking? Authorizing Provider  Prenatal Vit-Fe Fumarate-FA (PRENATAL VITAMIN PO) Take by mouth.   Yes [provider]  ibuprofen (ADVIL) 800 MG tablet Take 1 tablet (800 mg total) by mouth 3 (three) times daily. 12/18/18   Peri Jefferson, PA-C    Family History Family History  Problem Relation Age of Onset  . Diabetes Mother   . Hypertension Mother   . Multiple sclerosis Mother   . Heart disease Father   . Stroke Father   . Diabetes Maternal Grandmother     Social History Social History   Tobacco Use  . Smoking status: Never Smoker  . Smokeless tobacco: Never Used  Substance Use Topics  . Alcohol use: No  .  Drug use: No     Allergies   Penicillins   Review of Systems Review of Systems  Constitutional: Negative for activity change, chills, fatigue, fever and unexpected weight change.     Physical Exam Triage Vital Signs ED Triage Vitals  Enc Vitals Group     BP 12/18/18 1042 117/77     Pulse Rate 12/18/18 1042 87     Resp 12/18/18 1042 16     Temp 12/18/18 1042 97.9 F (36.6 C)     Temp Source 12/18/18 1042 Other     SpO2 12/18/18 1042 98 %     Weight --      Height --      Head Circumference --      Peak Flow --      Pain Score 12/18/18 1044 6     Pain Loc --      Pain Edu? --      Excl. in [place]? --    No data found.  Updated Vital Signs BP 117/77   Pulse 87   Temp 97.9 F (36.6 C) (Other (Comment))   Resp  16   SpO2 98%   Breastfeeding Yes   Visual Acuity Right Eye Distance:   Left Eye Distance:   Bilateral Distance:    Right Eye Near:   Left Eye Near:    Bilateral Near:     Physical Exam   UC Treatments / Results  Labs (all labs ordered are listed, but only abnormal results are displayed) Labs Reviewed  POC URINE PREG, ED  POCT PREGNANCY, URINE  Urine pregnancy negative UA showed trace RBC  EKG   Radiology No results found.  Procedures Procedures (including critical care time)  Medications Ordered in UC Medications - No data to display  Initial Impression / Assessment and Plan / UC Course  I have reviewed the triage vital signs and the nursing notes.  Pertinent labs & imaging results that were available during my care of the patient were reviewed by me and considered in my medical decision making (see chart for details).  Clinical Course as of Dec 17 1120  Wynelle Link Dec 18, 2018  1057 POCT Urinalysis, Dipstick [JL]    Clinical Course User Index [JL] Evern Core, PA-C     Final Clinical Impressions(s) / UC Diagnoses   Final diagnoses:  Abdominal wall pain in left flank     Discharge Instructions     Take medication as  prescribed. Follow up with PCP for further evaluation.    ED Prescriptions    Medication Sig Dispense Auth. Provider   ibuprofen (ADVIL) 800 MG tablet Take 1 tablet (800 mg total) by mouth 3 (three) times daily. 21 tablet Evern Core, PA-C     PDMP not reviewed this encounter.   Evern Core, PA-C 12/18/18 1122

## 2018-12-18 NOTE — ED Triage Notes (Signed)
C/O intermittent left side pain x couple months; now states pain constant.  Denies any urinary sxs.  Also reports not having menses >1 month. Denies any n/v/d or fevers.  Denies any vaginal discharge.

## 2018-12-19 LAB — POCT URINALYSIS DIP (DEVICE)
Bilirubin Urine: NEGATIVE
Glucose, UA: NEGATIVE mg/dL
Ketones, ur: NEGATIVE mg/dL
Leukocytes,Ua: NEGATIVE
Nitrite: NEGATIVE
Protein, ur: NEGATIVE mg/dL
Specific Gravity, Urine: >=1.03 (ref 1.005–1.030)
Urobilinogen, UA: 0.2 mg/dL (ref 0.0–1.0)
pH: 6 (ref 5.0–8.0)

## 2019-03-14 ENCOUNTER — Other Ambulatory Visit: Payer: Self-pay

## 2019-03-14 ENCOUNTER — Ambulatory Visit (HOSPITAL_COMMUNITY)
Admission: EM | Admit: 2019-03-14 | Discharge: 2019-03-14 | Disposition: A | Discharge status: Home / Self Care | Payer: HRSA Program | Attending: Family Medicine | Admitting: Family Medicine

## 2019-03-14 ENCOUNTER — Encounter (HOSPITAL_COMMUNITY): Payer: Self-pay

## 2019-03-14 DIAGNOSIS — Z20822 Contact with and (suspected) exposure to covid-19: Secondary | ICD-10-CM | POA: Insufficient documentation

## 2019-03-14 NOTE — Discharge Instructions (Signed)
We will manage this as a viral syndrome. For sore throat or cough try using a honey-based tea. Use 3 teaspoons of honey with juice squeezed from half lemon. Place shaved pieces of ginger into 1/2-1 cup of water and warm over stove top. Then mix the ingredients and repeat every 4 hours as needed. Please take Tylenol 500mg every 6 hours. Hydrate very well with at least 2 liters of water. Eat light meals such as soups to replenish electrolytes and soft fruits, veggies. Start an antihistamine like Zyrtec, Allegra or Claritin for postnasal drainage, sinus congestion.  You can take this together with pseudoephedrine (Sudafed) at a dose of 60 mg 3 times a day or 120 mg twice daily as needed for the same kind of congestion.    

## 2019-03-14 NOTE — ED Provider Notes (Signed)
[facility]   MRN: [id number] DOB: 02-Aug-1990  Subjective:   Janice White is a 29 y.o. female presenting for COVID 19 testing. Patient had close exposure to a positive contact. Denies sx.   No current facility-administered medications for this encounter.  Current Outpatient Medications:  .  ibuprofen (ADVIL) 800 MG tablet, Take 1 tablet (800 mg total) by mouth 3 (three) times daily., Disp: 21 tablet, Rfl: 0 .  Prenatal Vit-Fe Fumarate-FA (PRENATAL VITAMIN PO), Take by mouth., Disp: , Rfl:    Allergies  Allergen Reactions  . Penicillins Itching    Has patient had a PCN reaction causing immediate rash, facial/tongue/throat swelling, SOB or lightheadedness with hypotension: No Has patient had a PCN reaction causing severe rash involving mucus membranes or skin necrosis: No Has patient had a PCN reaction that required hospitalization No Has patient had a PCN reaction occurring within the last 10 years: Yes If all of the above answers are "NO", then may proceed with Cephalosporin use.     Past Medical History:  Diagnosis Date  . Asthma   . PONV (postoperative nausea and vomiting)      Past Surgical History:  Procedure Laterality Date  . WISDOM TOOTH EXTRACTION      Family History  Problem Relation Age of Onset  . Diabetes Mother   . Hypertension Mother   . Multiple sclerosis Mother   . Heart disease Father   . Stroke Father   . Diabetes Maternal Grandmother     Social History   Tobacco Use  . Smoking status: Never Smoker  . Smokeless tobacco: Never Used  Substance Use Topics  . Alcohol use: No  . Drug use: No    Review of Systems  Constitutional: Negative for fever and malaise/fatigue.  HENT: Negative for congestion, ear pain, sinus pain and sore throat.   Eyes: Negative for discharge and redness.  Respiratory: Negative for cough, hemoptysis, shortness of breath and wheezing.   Cardiovascular: Negative for chest pain.  Gastrointestinal: Negative  for abdominal pain, diarrhea, nausea and vomiting.  Genitourinary: Negative for dysuria, flank pain and hematuria.  Musculoskeletal: Negative for myalgias.  Skin: Negative for rash.  Neurological: Negative for dizziness, weakness and headaches.  Psychiatric/Behavioral: Negative for depression and substance abuse.     Objective:   Vitals: BP 132/82 (BP Location: Right Arm)   Pulse 100   Temp 98.8 F (37.1 C) (Oral)   Resp 16   Wt 148 lb 12.8 oz (67.5 kg)   LMP 02/27/2019   SpO2 100%   BMI 30.05 kg/m   Physical Exam Constitutional:      General: She is not in acute distress.    Appearance: Normal appearance. She is well-developed. She is not ill-appearing, toxic-appearing or diaphoretic.  HENT:     Head: Normocephalic and atraumatic.     Nose: Nose normal.     Mouth/Throat:     Mouth: Mucous membranes are moist.     Pharynx: Oropharynx is clear.  Eyes:     General: No scleral icterus.    Extraocular Movements: Extraocular movements intact.     Pupils: Pupils are equal, round, and reactive to light.  Cardiovascular:     Rate and Rhythm: Normal rate.  Pulmonary:     Effort: Pulmonary effort is normal.  Skin:    General: Skin is warm and dry.  Neurological:     General: No focal deficit present.     Mental Status: She is alert and oriented to person,  place, and time.  Psychiatric:        Mood and Affect: Mood normal.        Behavior: Behavior normal.        Thought Content: Thought content normal.        Judgment: Judgment normal.      Assessment and Plan :   1. Exposure to COVID-19 virus     Counseled patient on nature of COVID-19 including modes of transmission, diagnostic testing, management and supportive care.  Counseled on medications used for symptomatic relief. COVID 19 testing is pending. Counseled patient on potential for adverse effects with medications prescribed/recommended today, ER and return-to-clinic precautions discussed, patient verbalized  understanding.     Jaynee Eagles, [place] 03/14/19 1334

## 2019-03-14 NOTE — ED Triage Notes (Signed)
Pt states she wants to be tested for Covid.

## 2019-03-16 LAB — NOVEL CORONAVIRUS, NAA (HOSP ORDER, SEND-OUT TO REF LAB; TAT 18-24 HRS): SARS-CoV-2, NAA: NOT DETECTED

## 2019-07-04 ENCOUNTER — Other Ambulatory Visit: Payer: Self-pay

## 2019-07-04 ENCOUNTER — Encounter (HOSPITAL_COMMUNITY): Payer: Self-pay

## 2019-07-04 ENCOUNTER — Ambulatory Visit (HOSPITAL_COMMUNITY)
Admission: EM | Admit: 2019-07-04 | Discharge: 2019-07-04 | Disposition: A | Discharge status: Home / Self Care | Payer: Self-pay | Attending: Family Medicine | Admitting: Family Medicine

## 2019-07-04 DIAGNOSIS — Z113 Encounter for screening for infections with a predominantly sexual mode of transmission: Secondary | ICD-10-CM | POA: Insufficient documentation

## 2019-07-04 DIAGNOSIS — J45909 Unspecified asthma, uncomplicated: Secondary | ICD-10-CM | POA: Insufficient documentation

## 2019-07-04 DIAGNOSIS — Z880 Allergy status to penicillin: Secondary | ICD-10-CM | POA: Insufficient documentation

## 2019-07-04 DIAGNOSIS — Z20822 Contact with and (suspected) exposure to covid-19: Secondary | ICD-10-CM | POA: Insufficient documentation

## 2019-07-04 DIAGNOSIS — R1032 Left lower quadrant pain: Secondary | ICD-10-CM | POA: Insufficient documentation

## 2019-07-04 LAB — POCT URINALYSIS DIP (DEVICE)
Bilirubin Urine: NEGATIVE
Glucose, UA: NEGATIVE mg/dL
Nitrite: POSITIVE — AB
Protein, ur: NEGATIVE mg/dL
Specific Gravity, Urine: >=1.03 (ref 1.005–1.030)
Urobilinogen, UA: 0.2 mg/dL (ref 0.0–1.0)
pH: 6 (ref 5.0–8.0)

## 2019-07-04 LAB — POC URINE PREG, ED: Preg Test, Ur: NEGATIVE

## 2019-07-04 MED ORDER — CEPHALEXIN 500 MG PO CAPS: 500.0000 mg | ORAL_CAPSULE | Freq: Two times a day (BID) | ORAL | 0 refills | Status: DC

## 2019-07-04 NOTE — ED Triage Notes (Signed)
Pt c/o 4/10 sharp dull pain in LUQ of abdomen and nauseax4 days. Pt denies V/D. Pt's last Bm was today.

## 2019-07-04 NOTE — Discharge Instructions (Addendum)
You have been seen today for abdominal pain. Your evaluation was not suggestive of any emergent condition requiring medical intervention at this time. However, some abdominal problems make take more time to appear. Therefore, it is very important for you to pay attention to any new symptoms or worsening of your current condition.  Please return here or to the Emergency Department immediately should you begin to feel worse in any way or have any of the following symptoms: increasing or different abdominal pain, persistent vomiting, inability to drink fluids, fevers, or shaking chills.   You have been given the following today for treatment of suspected gonorrhea and/or chlamydia:  We have sent testing for sexually transmitted infections. We will notify you of any positive results once they are received. If required, we will prescribe any medications you might need.

## 2019-07-05 LAB — CERVICOVAGINAL ANCILLARY ONLY
Bacterial Vaginitis (gardnerella): POSITIVE — AB
Candida Glabrata: NEGATIVE
Candida Vaginitis: POSITIVE — AB
Chlamydia: NEGATIVE
Comment: NEGATIVE
Comment: NEGATIVE
Comment: NEGATIVE
Comment: NEGATIVE
Comment: NEGATIVE
Comment: NORMAL
Neisseria Gonorrhea: NEGATIVE
Trichomonas: NEGATIVE

## 2019-07-05 LAB — SARS CORONAVIRUS 2 (TAT 6-24 HRS): SARS Coronavirus 2: NEGATIVE

## 2019-07-05 NOTE — ED Provider Notes (Signed)
[facility]   [id number] 07/04/19 Arrival Time: 1511  ASSESSMENT & PLAN:  1. Left lower quadrant abdominal pain   2. Screening for STDs (sexually transmitted diseases)     Benign abdominal exam. No indications for urgent abdominal/pelvic imaging at this time. Discussed.  Given U/A will treat for cystitis: Vaginal cytology pending. UPT negative. COVID testing sent.  Meds ordered this encounter  Medications  . cephALEXin (KEFLEX) 500 MG capsule    Sig: Take 1 capsule (500 mg total) by mouth 2 (two) times daily.    Dispense:  10 capsule    Refill:  0     Discharge Instructions     You have been seen today for abdominal pain. Your evaluation was not suggestive of any emergent condition requiring medical intervention at this time. However, some abdominal problems make take more time to appear. Therefore, it is very important for you to pay attention to any new symptoms or worsening of your current condition.  Please return here or to the Emergency Department immediately should you begin to feel worse in any way or have any of the following symptoms: increasing or different abdominal pain, persistent vomiting, inability to drink fluids, fevers, or shaking chills.   You have been given the following today for treatment of suspected gonorrhea and/or chlamydia:  We have sent testing for sexually transmitted infections. We will notify you of any positive results once they are received. If required, we will prescribe any medications you might need.     Follow-up Information    [facility] EMERGENCY DEPARTMENT.   Specialty: Emergency Medicine Why: If symptoms worsen in any way. Contact information: [address] [id number] [place] [address] [phone]           Reviewed expectations re: course of current medical issues. Questions answered. Outlined signs and symptoms indicating need for more acute intervention. Patient  verbalized understanding. After Visit Summary given.   SUBJECTIVE: History from: patient. Janice White is a 29 y.o. female who presents with complaint of a "dull pain" of loer abdomen (L>R); gradual onset 3-4 d ago; mild nausea without emesis. Normal BM today. No hematuria. No vaginal discharge or specific pelvic pain. Ambulatory without difficulty. No specific aggravating or alleviating factors reported. Pain does not wake her at night. Normal PO intake. No OTC treatment reported.   No LMP recorded. (Menstrual status: Irregular Periods).    Past Surgical History:  Procedure Laterality Date  . WISDOM TOOTH EXTRACTION       OBJECTIVE:  Vitals:   07/04/19 1531 07/04/19 1532  BP: 130/67   Pulse: 87   Resp: 16   Temp: 97.7 F (36.5 C)   TempSrc: Oral   SpO2: 100%   Weight:  59 kg  Height:  5' (1.524 m)    General appearance: alert, oriented, no acute distress HEENT: [place]; AT; oropharynx moist Lungs: unlabored respirations Abdomen: soft; without distention; mild  and poorly localized tenderness to palpation over lower abdomen; more L sided; normal bowel sounds; without masses or organomegaly; without guarding or rebound tenderness Back: without reported CVA tenderness; FROM at waist Extremities: without LE edema; symmetrical; without gross deformities Skin: warm and dry Neurologic: normal gait Psychological: alert and cooperative; normal mood and affect  Labs: Results for orders placed or performed during the hospital encounter of 07/04/19  SARS CORONAVIRUS 2 (TAT 6-24 HRS) Nasopharyngeal Nasopharyngeal Swab   Specimen: Nasopharyngeal Swab  Result Value Ref Range   SARS Coronavirus 2 NEGATIVE  NEGATIVE  POCT urinalysis dip (device)  Result Value Ref Range   Glucose, UA NEGATIVE NEGATIVE mg/dL   Bilirubin Urine NEGATIVE NEGATIVE   Ketones, ur TRACE (A) NEGATIVE mg/dL   Specific Gravity, Urine >=1.030 1.005 - 1.030   Hgb urine dipstick TRACE (A) NEGATIVE   pH 6.0 5.0  - 8.0   Protein, ur NEGATIVE NEGATIVE mg/dL   Urobilinogen, UA 0.2 0.0 - 1.0 mg/dL   Nitrite POSITIVE (A) NEGATIVE   Leukocytes,Ua TRACE (A) NEGATIVE  POC urine preg, ED (not at [facility])  Result Value Ref Range   Preg Test, Ur NEGATIVE NEGATIVE   Labs Reviewed  POCT URINALYSIS DIP (DEVICE) - Abnormal; Notable for the following components:      Result Value   Ketones, ur TRACE (*)    Hgb urine dipstick TRACE (*)    Nitrite POSITIVE (*)    Leukocytes,Ua TRACE (*)    All other components within normal limits  SARS CORONAVIRUS 2 (TAT 6-24 HRS)  URINE CULTURE  POC URINE PREG, ED  CERVICOVAGINAL ANCILLARY ONLY       Allergies  Allergen Reactions  . Penicillins Itching    Has patient had a PCN reaction causing immediate rash, facial/tongue/throat swelling, SOB or lightheadedness with hypotension: No Has patient had a PCN reaction causing severe rash involving mucus membranes or skin necrosis: No Has patient had a PCN reaction that required hospitalization No Has patient had a PCN reaction occurring within the last 10 years: Yes If all of the above answers are "NO", then may proceed with Cephalosporin use.                                                Past Medical History:  Diagnosis Date  . Asthma   . PONV (postoperative nausea and vomiting)     Social History   Socioeconomic History  . Marital status: Single    Spouse name: Not on file  . Number of children: Not on file  . Years of education: Not on file  . Highest education level: Not on file  Occupational History  . Not on file  Tobacco Use  . Smoking status: Never Smoker  . Smokeless tobacco: Never Used  Substance and Sexual Activity  . Alcohol use: No  . Drug use: No  . Sexual activity: Not Currently  Other Topics Concern  . Not on file  Social History Narrative  . Not on file   Social Determinants of Health   Financial Resource Strain:   . Difficulty of Paying Living Expenses:   Food Insecurity:   .  Worried About Charity fundraiser in the Last Year:   . Arboriculturist in the Last Year:   Transportation Needs:   . Film/video editor (Medical):   Marland Kitchen Lack of Transportation (Non-Medical):   Physical Activity:   . Days of Exercise per Week:   . Minutes of Exercise per Session:   Stress:   . Feeling of Stress :   Social Connections:   . Frequency of Communication with Friends and Family:   . Frequency of Social Gatherings with Friends and Family:   . Attends Religious Services:   . Active Member of Clubs or Organizations:   . Attends Archivist Meetings:   Marland Kitchen Marital Status:   Intimate Partner Violence:   . Fear  of Current or Ex-Partner:   . Emotionally Abused:   Marland Kitchen Physically Abused:   . Sexually Abused:     Family History  Problem Relation Age of Onset  . Diabetes Mother   . Hypertension Mother   . Multiple sclerosis Mother   . Heart disease Father   . Stroke Father   . Diabetes Maternal Dulce Sellar, MD 07/05/19 [phone]

## 2019-07-06 ENCOUNTER — Telehealth (HOSPITAL_COMMUNITY): Payer: Self-pay | Admitting: Orthopedic Surgery

## 2019-07-06 LAB — URINE CULTURE: Culture: >=100000 — AB

## 2019-07-06 MED ORDER — TERCONAZOLE 0.4 % VA CREA: 1.0000 | TOPICAL_CREAM | Freq: Every day | VAGINAL | 0 refills | Status: DC

## 2019-07-06 MED ORDER — METRONIDAZOLE 0.75 % VA GEL: 1.0000 | Freq: Every day | VAGINAL | 0 refills | Status: DC

## 2019-07-07 ENCOUNTER — Telehealth (HOSPITAL_COMMUNITY): Payer: Self-pay | Admitting: Orthopedic Surgery

## 2019-09-05 IMAGING — CR DG CHEST 2V
2 series · 2 of 2 positions shown · non-contrast
Comparison: 12/14/2012 chest radiograph.

CLINICAL DATA: Chest pain and dyspnea

EXAM:
CHEST - 2 VIEW

[chest pa]
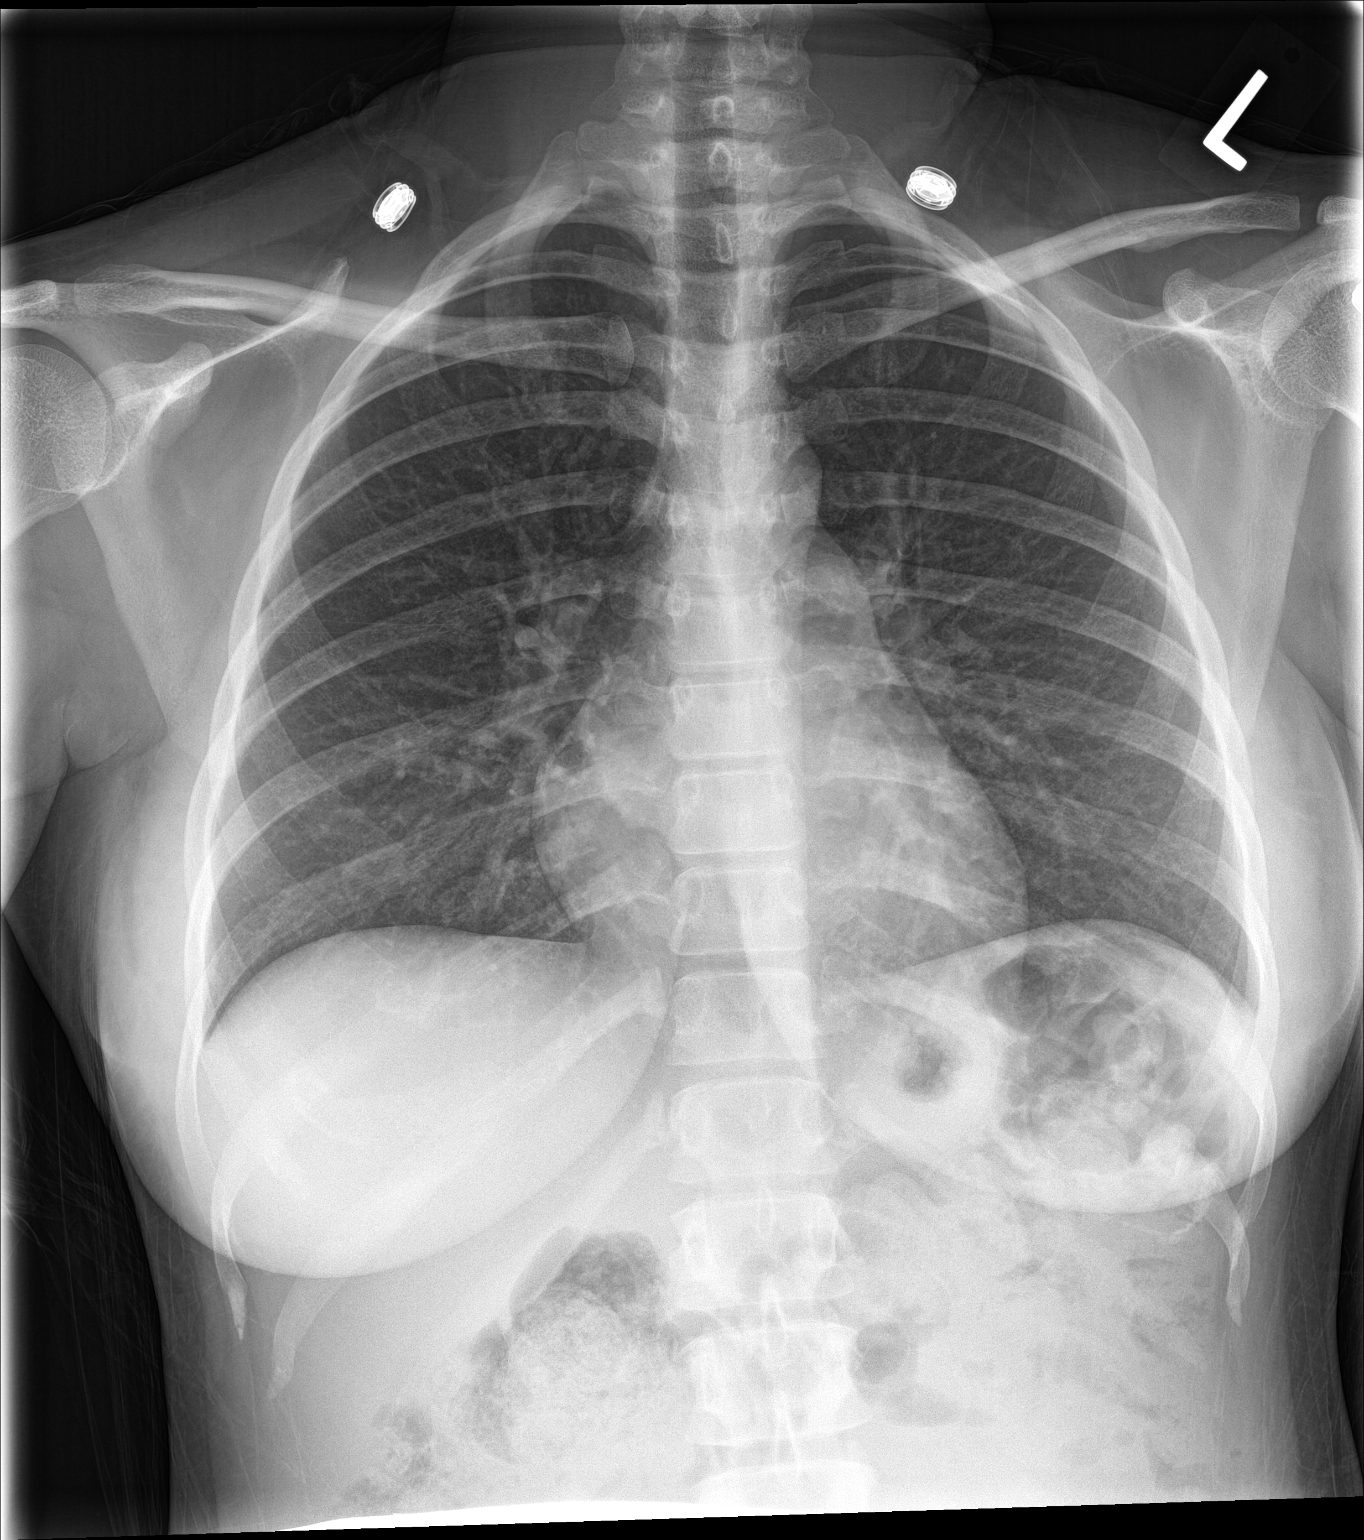

[chest lat]
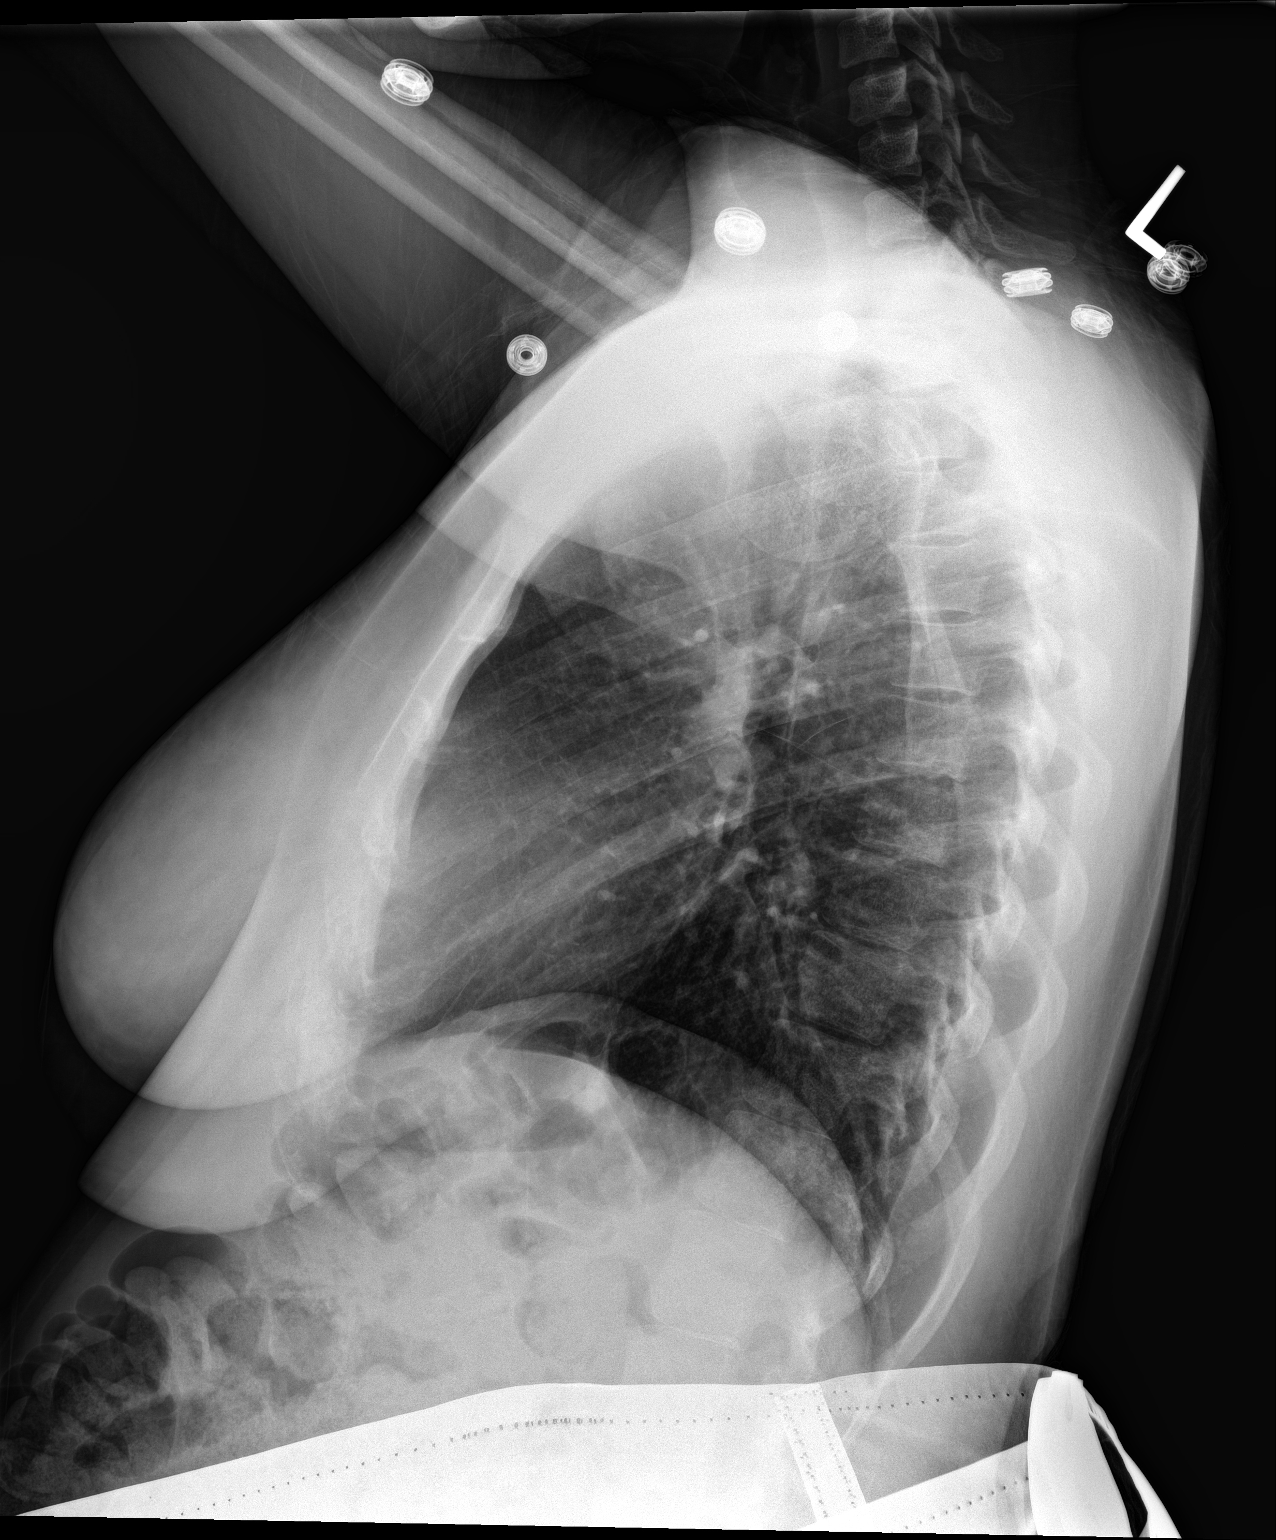

[2 of 2 positions shown; findings below may reference images not displayed]

FINDINGS: Stable cardiomediastinal silhouette with normal heart size. No
pneumothorax. No pleural effusion. Lungs appear clear, with no acute
consolidative airspace disease and no pulmonary edema.
IMPRESSION: No active cardiopulmonary disease.

## 2019-09-11 ENCOUNTER — Emergency Department (HOSPITAL_COMMUNITY): Payer: Self-pay

## 2019-09-11 ENCOUNTER — Encounter (HOSPITAL_COMMUNITY): Payer: Self-pay

## 2019-09-11 ENCOUNTER — Emergency Department (HOSPITAL_COMMUNITY)
Admission: EM | Admit: 2019-09-11 | Discharge: 2019-09-11 | Disposition: A | Discharge status: Home / Self Care | Payer: Self-pay | Attending: Emergency Medicine | Admitting: Emergency Medicine

## 2019-09-11 ENCOUNTER — Other Ambulatory Visit: Payer: Self-pay

## 2019-09-11 ENCOUNTER — Emergency Department (HOSPITAL_COMMUNITY): Payer: PRIVATE HEALTH INSURANCE

## 2019-09-11 DIAGNOSIS — Z5321 Procedure and treatment not carried out due to patient leaving prior to being seen by health care provider: Secondary | ICD-10-CM | POA: Diagnosis not present

## 2019-09-11 DIAGNOSIS — R0789 Other chest pain: Secondary | ICD-10-CM | POA: Insufficient documentation

## 2019-09-11 DIAGNOSIS — R002 Palpitations: Secondary | ICD-10-CM | POA: Insufficient documentation

## 2019-09-11 LAB — CBC
HCT: 40 % (ref 36.0–46.0)
HCT: 39.2 % (ref 36.0–46.0)
Hemoglobin: 12.4 g/dL (ref 12.0–15.0)
Hemoglobin: 12.3 g/dL (ref 12.0–15.0)
MCH: 26.2 pg (ref 26.0–34.0)
MCH: 26.4 pg (ref 26.0–34.0)
MCHC: 31 g/dL (ref 30.0–36.0)
MCHC: 31.4 g/dL (ref 30.0–36.0)
MCV: 84.6 fL (ref 80.0–100.0)
MCV: 84.1 fL (ref 80.0–100.0)
Platelets: 332 10*3/uL (ref 150–400)
Platelets: 311 10*3/uL (ref 150–400)
RBC: 4.73 MIL/uL (ref 3.87–5.11)
RBC: 4.66 MIL/uL (ref 3.87–5.11)
RDW: 13.4 % (ref 11.5–15.5)
RDW: 13.6 % (ref 11.5–15.5)
WBC: 8.2 10*3/uL (ref 4.0–10.5)
WBC: 8.6 10*3/uL (ref 4.0–10.5)
nRBC: 0 % (ref 0.0–0.2)
nRBC: 0 % (ref 0.0–0.2)

## 2019-09-11 LAB — BASIC METABOLIC PANEL
Anion gap: 7 (ref 5–15)
Anion gap: 12 (ref 5–15)
BUN: 7 mg/dL (ref 6–20)
BUN: 9 mg/dL (ref 6–20)
CO2: 26 mmol/L (ref 22–32)
CO2: 25 mmol/L (ref 22–32)
Calcium: 9.7 mg/dL (ref 8.9–10.3)
Calcium: 10 mg/dL (ref 8.9–10.3)
Chloride: 106 mmol/L (ref 98–111)
Chloride: 102 mmol/L (ref 98–111)
Creatinine, Ser: 0.78 mg/dL (ref 0.44–1.00)
Creatinine, Ser: 0.66 mg/dL (ref 0.44–1.00)
GFR calc Af Amer: >60 mL/min (ref >60)
GFR calc Af Amer: >60 mL/min (ref >60)
GFR calc non Af Amer: >60 mL/min (ref >60)
GFR calc non Af Amer: >60 mL/min (ref >60)
Glucose, Bld: 99 mg/dL (ref 70–99)
Glucose, Bld: 158 mg/dL — ABNORMAL HIGH (ref 70–99)
Potassium: 4.1 mmol/L (ref 3.5–5.1)
Potassium: 3.8 mmol/L (ref 3.5–5.1)
Sodium: 139 mmol/L (ref 135–145)
Sodium: 139 mmol/L (ref 135–145)

## 2019-09-11 LAB — I-STAT BETA HCG BLOOD, ED (MC, WL, AP ONLY): I-stat hCG, quantitative: <5 m[IU]/mL (ref <5)

## 2019-09-11 LAB — TROPONIN I (HIGH SENSITIVITY)
Troponin I (High Sensitivity): <2 ng/L (ref <18)
Troponin I (High Sensitivity): <2 ng/L (ref <18)

## 2019-09-11 LAB — I-STAT BETA HCG BLOOD, ED (NOT ORDERABLE): I-stat hCG, quantitative: <5 m[IU]/mL (ref <5)

## 2019-09-11 MED ORDER — SODIUM CHLORIDE 0.9% FLUSH: 3.0000 mL | Freq: Once | INTRAVENOUS | Status: DC

## 2019-09-11 NOTE — ED Notes (Addendum)
Pt wanting to leave. Pt asked if information (results) could be transferred over to Urgent Care. Pt informed that she would have to start from the beginning at Urgent Care. Pt stated that she knew.  Pt will be moved OTF.

## 2019-09-11 NOTE — ED Triage Notes (Signed)
Pt sts sternal chest tightness and  Heart palpitations that have been getting worse for 3 days.

## 2019-09-11 NOTE — ED Triage Notes (Signed)
Pt endorses chest pressure and palpitations since last night.

## 2019-09-12 ENCOUNTER — Emergency Department (HOSPITAL_COMMUNITY): Payer: Self-pay

## 2019-09-12 ENCOUNTER — Emergency Department (HOSPITAL_COMMUNITY)
Admission: EM | Admit: 2019-09-12 | Discharge: 2019-09-12 | Disposition: A | Discharge status: Home / Self Care | Payer: Self-pay | Source: Home / Self Care | Attending: Emergency Medicine | Admitting: Emergency Medicine

## 2019-09-12 ENCOUNTER — Emergency Department (HOSPITAL_COMMUNITY)
Admission: EM | Admit: 2019-09-12 | Discharge: 2019-09-12 | Disposition: A | Discharge status: Home / Self Care | Payer: PRIVATE HEALTH INSURANCE | Attending: Emergency Medicine | Admitting: Emergency Medicine

## 2019-09-12 DIAGNOSIS — R0789 Other chest pain: Secondary | ICD-10-CM

## 2019-09-12 LAB — CBC WITH DIFFERENTIAL/PLATELET
Abs Immature Granulocytes: 0.02 10*3/uL (ref 0.00–0.07)
Basophils Absolute: 0 10*3/uL (ref 0.0–0.1)
Basophils Relative: 0 %
Eosinophils Absolute: 0.1 10*3/uL (ref 0.0–0.5)
Eosinophils Relative: 1 %
HCT: 41 % (ref 36.0–46.0)
Hemoglobin: 13.1 g/dL (ref 12.0–15.0)
Immature Granulocytes: 0 %
Lymphocytes Relative: 31 %
Lymphs Abs: 2.6 10*3/uL (ref 0.7–4.0)
MCH: 26.7 pg (ref 26.0–34.0)
MCHC: 32 g/dL (ref 30.0–36.0)
MCV: 83.7 fL (ref 80.0–100.0)
Monocytes Absolute: 0.7 10*3/uL (ref 0.1–1.0)
Monocytes Relative: 9 %
Neutro Abs: 4.9 10*3/uL (ref 1.7–7.7)
Neutrophils Relative %: 59 %
Platelets: 325 10*3/uL (ref 150–400)
RBC: 4.9 MIL/uL (ref 3.87–5.11)
RDW: 13.7 % (ref 11.5–15.5)
WBC: 8.3 10*3/uL (ref 4.0–10.5)
nRBC: 0 % (ref 0.0–0.2)

## 2019-09-12 LAB — BASIC METABOLIC PANEL
Anion gap: 10 (ref 5–15)
BUN: 11 mg/dL (ref 6–20)
CO2: 25 mmol/L (ref 22–32)
Calcium: 10 mg/dL (ref 8.9–10.3)
Chloride: 103 mmol/L (ref 98–111)
Creatinine, Ser: 0.6 mg/dL (ref 0.44–1.00)
GFR calc Af Amer: >60 mL/min (ref >60)
GFR calc non Af Amer: >60 mL/min (ref >60)
Glucose, Bld: 104 mg/dL — ABNORMAL HIGH (ref 70–99)
Potassium: 4.1 mmol/L (ref 3.5–5.1)
Sodium: 138 mmol/L (ref 135–145)

## 2019-09-12 LAB — I-STAT BETA HCG BLOOD, ED (MC, WL, AP ONLY): I-stat hCG, quantitative: <5 m[IU]/mL (ref <5)

## 2019-09-12 LAB — TROPONIN I (HIGH SENSITIVITY): Troponin I (High Sensitivity): <2 ng/L (ref <18)

## 2019-09-12 NOTE — ED Notes (Signed)
ED Provider at bedside. 

## 2019-09-12 NOTE — Discharge Instructions (Addendum)
You have been seen here for chest pain.  Labs and imaging all look reassuring.  I recommend taking over-the-counter pain meds like ibuprofen and Tylenol every 6 hours as needed for pain.  Please follow dosing in the back of bottle.  I have given you information for community [facility] and wellness they work with individuals who have little to no insurance and they can help you find a primary care provider.  I feel like you need a PCP please call them at your earliest convenience.  I want to come back to emergency department if you develop chest pain, shortness of breath, severe abdominal pain, uncontrolled nausea, vomiting, diarrhea fever as the symptoms require further evaluation management.

## 2019-09-12 NOTE — ED Triage Notes (Signed)
Patient reports chest palpitations and tightness x4 days. Patient was recently seen last night and left due to wait times and did the same at another hospital. Patinet's labs and EKG's were completed.

## 2019-09-12 NOTE — ED Provider Notes (Signed)
[facility]-EMERGENCY DEPT Provider Note   CSN: [id number] Arrival date & time: 09/12/19  0854     History Chief Complaint  Patient presents with  . Chest Pain    Janice White is a 29 y.o. female.  HPI   Patient presents to the emergency department with chief complaint of chest tightness and the feeling that her heart is not beating fast enough.  Patient states it is going on for last 3 days it mainly happens at night when she is trying to go to sleep.  She states she does not have chest palpitations but feels like her heart is not beating fast enough and that she has some chest tightness that is mainly on her sternum and left chest.  This tightness does not radiate, she is nondiaphoretic when this happens, no nausea or vomiting, paresthesias in extremities, this pain is nonexertional.  The pain lasts until she wakes up in the morning and then goes away.  She denies alleviating or aggravating factors.  She has no significant cardiac history, has no family history of DVTs or PEs, she denies smoking, IV drug use, hormone therapy, recent surgery or traumas.  Patient has no significant medical history, does not take a medication on daily basis.  Patient denies headache, fever, chills, short of breath, abdominal pain, dysuria, pedal edema.  Past Medical History:  Diagnosis Date  . Asthma   . PONV (postoperative nausea and vomiting)     Patient Active Problem List   Diagnosis Date Noted  . 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 Postpartum care following vaginal delivery 01/28/2018  . Normal labor and delivery 01/26/2018  . Irregular menstrual cycle 06/09/2016    Past Surgical History:  Procedure Laterality Date  . WISDOM TOOTH EXTRACTION       OB History    Gravida  1   Para  1   Term  1   Preterm  0   AB  0   Living  1     SAB  0   TAB  0   Ectopic  0   Multiple  0   Live Births  1           Family History  Problem Relation Age of Onset    . Diabetes Mother   . Hypertension Mother   . Multiple sclerosis Mother   . Heart disease Father   . Stroke Father   . Diabetes Maternal Grandmother     Social History   Tobacco Use  . Smoking status: Never Smoker  . Smokeless tobacco: Never Used  Vaping Use  . Vaping Use: Never used  Substance Use Topics  . Alcohol use: No  . Drug use: No    Home Medications Prior to Admission medications   Medication Sig Start Date End Date Taking? Authorizing Provider  terconazole (TERAZOL 7) 0.4 % vaginal cream Place 1 applicator vaginally at bedtime. 1 Applicator to vagina at night for 7 days Patient not taking: Reported on 09/11/2019 07/06/19   Merrilee Jansky, MD    Allergies    Penicillins  Review of Systems   Review of Systems  Constitutional: Negative for chills and fever.  HENT: Negative for congestion, tinnitus and trouble swallowing.   Eyes: Negative for visual disturbance.  Respiratory: Negative for cough, shortness of breath and stridor.   Cardiovascular: Positive for chest pain. Negative for palpitations and leg swelling.  Gastrointestinal: Negative for abdominal pain, diarrhea, nausea and vomiting.  Genitourinary: Negative for enuresis,  flank pain and vaginal bleeding.  Musculoskeletal: Negative for back pain and myalgias.  Skin: Negative for rash.  Neurological: Negative for dizziness and headaches.  Hematological: Does not bruise/bleed easily.    Physical Exam Updated Vital Signs BP 100/77   Pulse 65   Temp 99.2 F (37.3 C)   Resp 18   LMP 08/19/2019 (Approximate)   SpO2 99%   Physical Exam Vitals and nursing note reviewed.  Constitutional:      General: She is not in acute distress.    Appearance: She is not ill-appearing.  HENT:     Head: Normocephalic and atraumatic.     Nose: No congestion.     Mouth/Throat:     Mouth: Mucous membranes are moist.     Pharynx: Oropharynx is clear.  Eyes:     General: No scleral icterus. Cardiovascular:      Rate and Rhythm: Normal rate and regular rhythm.     Pulses: Normal pulses.     Heart sounds: No murmur heard.  No friction rub. No gallop.   Pulmonary:     Effort: No respiratory distress.     Breath sounds: No stridor. No wheezing, rhonchi or rales.  Abdominal:     General: There is no distension.     Tenderness: There is no abdominal tenderness. There is no right CVA tenderness, left CVA tenderness or guarding.  Musculoskeletal:        General: No swelling.  Skin:    General: Skin is warm and dry.     Capillary Refill: Capillary refill takes less than 2 seconds.     Findings: No rash.  Neurological:     Mental Status: She is alert and oriented to person, place, and time.  Psychiatric:        Mood and Affect: Mood normal.     ED Results / Procedures / Treatments   Labs (all labs ordered are listed, but only abnormal results are displayed) Labs Reviewed  BASIC METABOLIC PANEL - Abnormal; Notable for the following components:      Result Value   Glucose, Bld 104 (*)    All other components within normal limits  CBC WITH DIFFERENTIAL/PLATELET  I-STAT BETA HCG BLOOD, ED ([facility], [facility], [facility] ONLY)  TROPONIN I (HIGH SENSITIVITY)  TROPONIN I (HIGH SENSITIVITY)    EKG EKG Interpretation  Date/Time:  Tuesday September 12 2019 12:18:37 EDT Ventricular Rate:  73 PR Interval:    QRS Duration: 80 QT Interval:  365 QTC Calculation: 403 R Axis:   75 Text Interpretation: Sinus rhythm Normal ECG No significant change since last tracing Confirmed by Gwyneth Sprout ([id number]) on 09/12/2019 12:31:16 PM   Radiology DG Chest 2 View  Result Date: 09/12/2019 CLINICAL DATA:  Chest pain EXAM: CHEST - 2 VIEW COMPARISON:  09/11/2019 FINDINGS: The heart size and mediastinal contours are within normal limits. Both lungs are clear. The visualized skeletal structures are unremarkable. IMPRESSION: No active cardiopulmonary disease. Electronically Signed   By: Duanne Guess D.O.   On: 09/12/2019 12:40    DG Chest 2 View  Result Date: 09/11/2019 CLINICAL DATA:  29 year old female with shortness of breath. EXAM: CHEST - 2 VIEW COMPARISON:  Chest radiograph dated 09/11/2019. FINDINGS: The heart size and mediastinal contours are within normal limits. Both lungs are clear. The visualized skeletal structures are unremarkable. IMPRESSION: No active cardiopulmonary disease. Electronically Signed   By: Elgie Collard M.D.   On: 09/11/2019 20:41   DG Chest 2 View  Result Date: 09/11/2019  CLINICAL DATA:  Chest pain EXAM: CHEST - 2 VIEW COMPARISON:  11/18/2017 FINDINGS: The heart size and mediastinal contours are within normal limits. Both lungs are clear. The visualized skeletal structures are unremarkable. IMPRESSION: No active cardiopulmonary disease. Electronically Signed   By: Kayleigh Pang M.D.   On: 09/11/2019 17:39    Procedures Procedures (including critical care time)  Medications Ordered in ED Medications - No data to display  ED Course  I have reviewed the triage vital signs and the nursing notes.  Pertinent labs & imaging results that were available during my care of the patient were reviewed by me and considered in my medical decision making (see chart for details).    MDM Rules/Calculators/A&P                          I have personally reviewed all imaging, labs and have interpreted them.  I have low suspicion for MI or cardiac abnormality as she has no prior cardiac history, little risk factors, EKG showed sinus rhythm without signs of ischemia, troponin was less than 2, presentation is atypical for MI as she describes chest tightness along her chest that only happens at night and relieves during the day none exertional.  I have low suspicion for PE as she is PERC negative, has had no pedal edema, no recent surgeries, no history of DVTs or PE in her family.  I have low suspicion for systemic infection as she does not have leukocytosis, vital signs are reassuring, x-ray did not  show any acute abnormalities, no consolidations, infiltrations, edema noted.  Unlikely patient has anemia as hemoglobin was 13.1. unlikely patient suffering from a metabolic abnormality as BMP does not show any electrolyte abnormalities, no signs of AKI.  Patient appears to be resting calmly in bed showing no acute signs distress.  Vital signs have remained stable does not meet criteria to be admitted to the hospital.  Chest pain differential includes anxiety versus GERD versus muscular strain versus costochondritis recommend over-the-counter pain medications as well as follow-up with her primary care doctor for further evaluation management.  Patient was discussed with attending who agrees assessment and plan.  Patient was given at home care as well as strict return precautions.  Patient verbalized that she understood and agreed with said plan. Final Clinical Impression(s) / ED Diagnoses Final diagnoses:  Chest wall pain    Rx / DC Orders ED Discharge Orders    None       Carroll Sage, PA-C 09/12/19 1359    Gwyneth Sprout, MD 09/12/19 1536

## 2019-12-31 ENCOUNTER — Encounter (HOSPITAL_COMMUNITY): Payer: Self-pay | Admitting: *Deleted

## 2019-12-31 ENCOUNTER — Other Ambulatory Visit: Payer: Self-pay

## 2019-12-31 ENCOUNTER — Ambulatory Visit (HOSPITAL_COMMUNITY)
Admission: EM | Admit: 2019-12-31 | Discharge: 2019-12-31 | Disposition: A | Discharge status: Home / Self Care | Payer: PRIVATE HEALTH INSURANCE | Attending: Internal Medicine | Admitting: Internal Medicine

## 2019-12-31 DIAGNOSIS — Z880 Allergy status to penicillin: Secondary | ICD-10-CM | POA: Insufficient documentation

## 2019-12-31 DIAGNOSIS — Z20822 Contact with and (suspected) exposure to covid-19: Secondary | ICD-10-CM | POA: Insufficient documentation

## 2019-12-31 DIAGNOSIS — J029 Acute pharyngitis, unspecified: Secondary | ICD-10-CM | POA: Insufficient documentation

## 2019-12-31 DIAGNOSIS — R051 Acute cough: Secondary | ICD-10-CM | POA: Insufficient documentation

## 2019-12-31 DIAGNOSIS — J069 Acute upper respiratory infection, unspecified: Secondary | ICD-10-CM | POA: Insufficient documentation

## 2019-12-31 LAB — POCT RAPID STREP A, ED / UC: Streptococcus, Group A Screen (Direct): NEGATIVE

## 2019-12-31 LAB — POC URINE PREG, ED: Preg Test, Ur: NEGATIVE

## 2019-12-31 LAB — SARS CORONAVIRUS 2 (TAT 6-24 HRS): SARS Coronavirus 2: NEGATIVE

## 2019-12-31 MED ORDER — CETIRIZINE HCL 10 MG PO CAPS: 10.0000 mg | ORAL_CAPSULE | Freq: Every day | ORAL | 0 refills | Status: DC

## 2019-12-31 NOTE — ED Provider Notes (Signed)
[facility]    CSN: [id number] Arrival date & time: 12/31/19  1007      History   Chief Complaint Chief Complaint  Patient presents with  . Sore Throat  . Cough    HPI Janice White is a 29 y.o. female presenting today for evaluation of sore throat and cough.  Reports over the past 3 days she has had throat irritation with associated cough.  Reports her son recently was diagnosed with strep.  Concerned about possible strep.  Denies any fevers.  Denies significant rhinorrhea.  Patient is currently breast-feeding her 75-month-old.  HPI  Past Medical History:  Diagnosis Date  . Asthma   . PONV (postoperative nausea and vomiting)     Patient Active Problem List   Diagnosis Date Noted  . 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 Postpartum care following vaginal delivery 01/28/2018  . Normal labor and delivery 01/26/2018  . Irregular menstrual cycle 06/09/2016    Past Surgical History:  Procedure Laterality Date  . WISDOM TOOTH EXTRACTION      OB History    Gravida  1   Para  1   Term  1   Preterm  0   AB  0   Living  1     SAB  0   TAB  0   Ectopic  0   Multiple  0   Live Births  1            Home Medications    Prior to Admission medications   Medication Sig Start Date End Date Taking? Authorizing Provider  Cetirizine HCl 10 MG CAPS Take 1 capsule (10 mg total) by mouth daily. 12/31/19   Jordyne Poehlman, Junius Creamer, PA-C    Family History Family History  Problem Relation Age of Onset  . Diabetes Mother   . Hypertension Mother   . Multiple sclerosis Mother   . Heart disease Father   . Stroke Father   . Diabetes Maternal Grandmother     Social History Social History   Tobacco Use  . Smoking status: Never Smoker  . Smokeless tobacco: Never Used  Vaping Use  . Vaping Use: Never used  Substance Use Topics  . Alcohol use: No  . Drug use: No     Allergies   Penicillins   Review of Systems Review of Systems  Constitutional:  Negative for activity change, appetite change, chills, fatigue and fever.  HENT: Positive for sore throat. Negative for congestion, ear pain, rhinorrhea, sinus pressure and trouble swallowing.   Eyes: Negative for discharge and redness.  Respiratory: Positive for cough. Negative for chest tightness and shortness of breath.   Cardiovascular: Negative for chest pain.  Gastrointestinal: Negative for abdominal pain, diarrhea, nausea and vomiting.  Musculoskeletal: Negative for myalgias.  Skin: Negative for rash.  Neurological: Negative for dizziness, light-headedness and headaches.     Physical Exam Triage Vital Signs ED Triage Vitals  Enc Vitals Group     BP 12/31/19 1029 (!) 127/7     Pulse Rate 12/31/19 1029 89     Resp 12/31/19 1029 16     Temp 12/31/19 1029 98.9 F (37.2 C)     Temp Source 12/31/19 1029 Oral     SpO2 12/31/19 1029 98 %     Weight 12/31/19 1033 110 lb (49.9 kg)     Height 12/31/19 1033 5' (1.524 m)     Head Circumference --      Peak Flow --  Pain Score 12/31/19 1033 6     Pain Loc --      Pain Edu? --      Excl. in GC? --    No data found.  Updated Vital Signs BP 127/77 (BP Location: Left Arm)   Pulse 89   Temp 98.9 F (37.2 C) (Oral)   Resp 16   Ht 5' (1.524 m)   Wt 110 lb (49.9 kg)   LMP 12/18/2019   SpO2 98%   BMI 21.48 kg/m   Visual Acuity Right Eye Distance:   Left Eye Distance:   Bilateral Distance:    Right Eye Near:   Left Eye Near:    Bilateral Near:     Physical Exam Vitals and nursing note reviewed.  Constitutional:      Appearance: She is well-developed.     Comments: No acute distress  HENT:     Head: Normocephalic and atraumatic.     Ears:     Comments: Bilateral ears without tenderness to palpation of external auricle, tragus and mastoid, EAC's without erythema or swelling, TM's with good bony landmarks and cone of light. Non erythematous.     Nose: Nose normal.     Mouth/Throat:     Comments: Oral mucosa pink  and moist, no tonsillar enlargement or exudate. Posterior pharynx patent and nonerythematous, no uvula deviation or swelling. Normal phonation. Eyes:     Conjunctiva/sclera: Conjunctivae normal.  Cardiovascular:     Rate and Rhythm: Normal rate.  Pulmonary:     Effort: Pulmonary effort is normal. No respiratory distress.     Comments: Breathing comfortably at rest, CTABL, no wheezing, rales or other adventitious sounds auscultated Abdominal:     General: There is no distension.  Musculoskeletal:        General: Normal range of motion.     Cervical back: Neck supple.  Skin:    General: Skin is warm and dry.  Neurological:     Mental Status: She is alert and oriented to person, place, and time.      UC Treatments / Results  Labs (all labs ordered are listed, but only abnormal results are displayed) Labs Reviewed  CULTURE, GROUP A STREP ([facility])  SARS CORONAVIRUS 2 (TAT 6-24 HRS)  POC URINE PREG, ED  POCT RAPID STREP A, ED / UC    EKG   Radiology No results found.  Procedures Procedures (including critical care time)  Medications Ordered in UC Medications - No data to display  Initial Impression / Assessment and Plan / UC Course  I have reviewed the triage vital signs and the nursing notes.  Pertinent labs & imaging results that were available during my care of the patient were reviewed by me and considered in my medical decision making (see chart for details).     Strep test negative, Covid test pending.  Suspect likely viral URI, postnasal drainage triggering cough.  Recommend symptomatic and supportive care rest and fluids.  Discussed strict return precautions. Patient verbalized understanding and is agreeable with plan.  Final Clinical Impressions(s) / UC Diagnoses   Final diagnoses:  Viral URI with cough     Discharge Instructions     Strep test negative, Covid test pending Begin daily cetirizine to help with postnasal drainage/throat irritation May  use over-the-counter Zarbee's/[place] which should be safe with breast-feeding Tylenol and ibuprofen as needed for sore throat Follow-up if not improving or worsening    ED Prescriptions    Medication Sig Dispense Auth. Provider  Cetirizine HCl 10 MG CAPS Take 1 capsule (10 mg total) by mouth daily. 15 capsule Olsen Mccutchan, [place] C, PA-C     PDMP not reviewed this encounter.   Lew Dawes, PA-C 12/31/19 1127

## 2019-12-31 NOTE — ED Triage Notes (Signed)
Pt reports her son was DX with strep on SAt. Pt has a sore throat,cough that started 3 days ago.

## 2019-12-31 NOTE — Discharge Instructions (Signed)
Strep test negative, Covid test pending Begin daily cetirizine to help with postnasal drainage/throat irritation May use over-the-counter Zar[employer]'s/[place] which should be safe with breast-feeding Tylenol and ibuprofen as needed for sore throat Follow-up if not improving or worsening

## 2020-01-02 LAB — CULTURE, GROUP A STREP (THRC)

## 2020-02-20 ENCOUNTER — Encounter (HOSPITAL_COMMUNITY): Payer: Self-pay

## 2020-02-20 ENCOUNTER — Ambulatory Visit (HOSPITAL_COMMUNITY)
Admission: EM | Admit: 2020-02-20 | Discharge: 2020-02-20 | Disposition: A | Discharge status: Home / Self Care | Payer: HRSA Program | Attending: Urgent Care | Admitting: Urgent Care

## 2020-02-20 ENCOUNTER — Other Ambulatory Visit: Payer: Self-pay

## 2020-02-20 DIAGNOSIS — R070 Pain in throat: Secondary | ICD-10-CM | POA: Diagnosis present

## 2020-02-20 DIAGNOSIS — J069 Acute upper respiratory infection, unspecified: Secondary | ICD-10-CM | POA: Insufficient documentation

## 2020-02-20 DIAGNOSIS — R059 Cough, unspecified: Secondary | ICD-10-CM | POA: Diagnosis not present

## 2020-02-20 DIAGNOSIS — Z20822 Contact with and (suspected) exposure to covid-19: Secondary | ICD-10-CM | POA: Insufficient documentation

## 2020-02-20 MED ORDER — PSEUDOEPHEDRINE HCL 30 MG PO TABS: 30.0000 mg | ORAL_TABLET | Freq: Three times a day (TID) | ORAL | 0 refills | Status: DC | PRN

## 2020-02-20 MED ORDER — CETIRIZINE HCL 10 MG PO TABS: 10.0000 mg | ORAL_TABLET | Freq: Every day | ORAL | 0 refills | Status: DC

## 2020-02-20 NOTE — Discharge Instructions (Addendum)

## 2020-02-20 NOTE — ED Provider Notes (Signed)
[facility]   MRN: [id number] DOB: 08/19/1990  Subjective:   Janice White is a 30 y.o. female presenting for 2-day history of dry cough, throat pain. Patient denies fever, chest pain, shortness of breath, body aches. She is Covid vaccinated. She wants to make sure that she does not have COVID-19 as she has a toddler. She is breast-feeding, trying to wean off of breast-feeding.  No current facility-administered medications for this encounter.  Current Outpatient Medications:  .  Cetirizine HCl 10 MG CAPS, Take 1 capsule (10 mg total) by mouth daily., Disp: 15 capsule, Rfl: 0   Allergies  Allergen Reactions  . Penicillins Itching    Has patient had a PCN reaction causing immediate rash, facial/tongue/throat swelling, SOB or lightheadedness with hypotension: No Has patient had a PCN reaction causing severe rash involving mucus membranes or skin necrosis: No Has patient had a PCN reaction that required hospitalization No Has patient had a PCN reaction occurring within the last 10 years: Yes If all of the above answers are "NO", then may proceed with Cephalosporin use.     Past Medical History:  Diagnosis Date  . Asthma   . PONV (postoperative nausea and vomiting)      Past Surgical History:  Procedure Laterality Date  . WISDOM TOOTH EXTRACTION      Family History  Problem Relation Age of Onset  . Diabetes Mother   . Hypertension Mother   . Multiple sclerosis Mother   . Heart disease Father   . Stroke Father   . Diabetes Maternal Grandmother     Social History   Tobacco Use  . Smoking status: Never Smoker  . Smokeless tobacco: Never Used  Vaping Use  . Vaping Use: Never used  Substance Use Topics  . Alcohol use: No  . Drug use: No    ROS   Objective:   Vitals: BP 137/81 (BP Location: Right Arm)   Pulse 92   Temp 98.5 F (36.9 C) (Oral)   Resp 18   LMP 02/06/2020   SpO2 100%   Physical Exam Constitutional:      General: She  is not in acute distress.    Appearance: Normal appearance. She is well-developed. She is not ill-appearing, toxic-appearing or diaphoretic.  HENT:     Head: Normocephalic and atraumatic.     Nose: Nose normal.     Mouth/Throat:     Mouth: Mucous membranes are moist.  Eyes:     Extraocular Movements: Extraocular movements intact.     Pupils: Pupils are equal, round, and reactive to light.  Cardiovascular:     Rate and Rhythm: Normal rate and regular rhythm.     Pulses: Normal pulses.     Heart sounds: Normal heart sounds. No murmur heard. No friction rub. No gallop.   Pulmonary:     Effort: Pulmonary effort is normal. No respiratory distress.     Breath sounds: Normal breath sounds. No stridor. No wheezing, rhonchi or rales.  Skin:    General: Skin is warm and dry.     Findings: No rash.  Neurological:     Mental Status: She is alert and oriented to person, place, and time.  Psychiatric:        Mood and Affect: Mood normal.        Behavior: Behavior normal.        Thought Content: Thought content normal.     Assessment and Plan :   PDMP not reviewed  this encounter.  1. Viral URI with cough   2. Throat pain     Will manage for viral illness such as viral URI, viral syndrome, viral rhinitis, COVID-19. Counseled patient on nature of COVID-19 including modes of transmission, diagnostic testing, management and supportive care.  Offered scripts for symptomatic relief. COVID 19 testing is pending. Counseled patient on potential for adverse effects with medications prescribed/recommended today, ER and return-to-clinic precautions discussed, patient verbalized understanding.     Wallis Bamberg, PA-C 02/20/20 1444

## 2020-02-20 NOTE — ED Triage Notes (Signed)
Pt presents with non productive cough and sore throat X 2 days. 

## 2020-02-21 ENCOUNTER — Other Ambulatory Visit: Payer: Self-pay

## 2020-02-21 LAB — SARS CORONAVIRUS 2 (TAT 6-24 HRS): SARS Coronavirus 2: NEGATIVE

## 2020-03-20 ENCOUNTER — Ambulatory Visit (HOSPITAL_COMMUNITY)
Admission: EM | Admit: 2020-03-20 | Discharge: 2020-03-20 | Disposition: A | Discharge status: Home / Self Care | Payer: Self-pay | Attending: Medical Oncology | Admitting: Medical Oncology

## 2020-03-20 ENCOUNTER — Other Ambulatory Visit: Payer: Self-pay

## 2020-03-20 ENCOUNTER — Encounter (HOSPITAL_COMMUNITY): Payer: Self-pay

## 2020-03-20 DIAGNOSIS — R102 Pelvic and perineal pain: Secondary | ICD-10-CM | POA: Insufficient documentation

## 2020-03-20 DIAGNOSIS — Z113 Encounter for screening for infections with a predominantly sexual mode of transmission: Secondary | ICD-10-CM | POA: Insufficient documentation

## 2020-03-20 LAB — POCT URINALYSIS DIPSTICK, ED / UC
Bilirubin Urine: NEGATIVE
Glucose, UA: NEGATIVE mg/dL
Hgb urine dipstick: NEGATIVE
Ketones, ur: NEGATIVE mg/dL
Nitrite: NEGATIVE
Protein, ur: NEGATIVE mg/dL
Specific Gravity, Urine: 1.02 (ref 1.005–1.030)
Urobilinogen, UA: 0.2 mg/dL (ref 0.0–1.0)
pH: 7.5 (ref 5.0–8.0)

## 2020-03-20 LAB — POC URINE PREG, ED: Preg Test, Ur: NEGATIVE

## 2020-03-20 NOTE — ED Provider Notes (Addendum)
[facility]    CSN: [id number] Arrival date & time: 03/20/20  0850      History   Chief Complaint Chief Complaint  Patient presents with  . Exposure to STD  . Abdominal Pain    HPI Janice White is a 30 y.o. female.   HPI   STD screening and Abdominal Pain: Pt reports that she has had abdominal cramping for the past few days. No vomiting, fever, dysuria. Some increase in her normal vaginal discharge. This has her concerned so she would like STI screening. No new exposures of concern. She rates the cramps as 7/10 in nature. She has taken tylenol which has helped some.  No chance of retained tampon. [facility]: Jan 27th.    Past Medical History:  Diagnosis Date  . Asthma   . PONV (postoperative nausea and vomiting)     Patient Active Problem List   Diagnosis Date Noted  . 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 Postpartum care following vaginal delivery 01/28/2018  . Normal labor and delivery 01/26/2018  . Irregular menstrual cycle 06/09/2016    Past Surgical History:  Procedure Laterality Date  . WISDOM TOOTH EXTRACTION      OB History    Gravida  1   Para  1   Term  1   Preterm  0   AB  0   Living  1     SAB  0   IAB  0   Ectopic  0   Multiple  0   Live Births  1            Home Medications    Prior to Admission medications   Medication Sig Start Date End Date Taking? Authorizing Provider  cetirizine (ZYRTEC ALLERGY) 10 MG tablet Take 1 tablet (10 mg total) by mouth daily. 02/20/20   Wallis Bamberg, PA-C  pseudoephedrine (SUDAFED) 30 MG tablet Take 1 tablet (30 mg total) by mouth every 8 (eight) hours as needed for congestion. 02/20/20   Wallis Bamberg, PA-C    Family History Family History  Problem Relation Age of Onset  . Diabetes Mother   . Hypertension Mother   . Multiple sclerosis Mother   . Heart disease Father   . Stroke Father   . Diabetes Maternal Grandmother     Social History Social History   Tobacco Use  . Smoking status:  Never Smoker  . Smokeless tobacco: Never Used  Vaping Use  . Vaping Use: Never used  Substance Use Topics  . Alcohol use: No  . Drug use: No     Allergies   Penicillins   Review of Systems Review of Systems  As stated above in HPI  Physical Exam Triage Vital Signs ED Triage Vitals  Enc Vitals Group     BP 03/20/20 0943 120/76     Pulse Rate 03/20/20 0943 70     Resp 03/20/20 0943 18     Temp 03/20/20 0943 98.6 F (37 C)     Temp Source 03/20/20 0943 Oral     SpO2 03/20/20 0943 100 %     Weight --      Height --      Head Circumference --      Peak Flow --      Pain Score 03/20/20 0942 0     Pain Loc --      Pain Edu? --      Excl. in GC? --    No data found.  Updated Vital  Signs BP 120/76 (BP Location: Left Arm)   Pulse 70   Temp 98.6 F (37 C) (Oral)   Resp 18   LMP 03/10/2020 (Exact Date)   SpO2 100%   Physical Exam Vitals and nursing note reviewed.  Constitutional:      General: She is not in acute distress.    Appearance: She is well-developed. She is not ill-appearing or toxic-appearing.  Cardiovascular:     Rate and Rhythm: Normal rate and regular rhythm.     Heart sounds: Normal heart sounds.  Pulmonary:     Effort: Pulmonary effort is normal.     Breath sounds: Normal breath sounds.  Abdominal:     General: Abdomen is flat. Bowel sounds are normal. There is no distension or abdominal bruit. There are no signs of injury.     Palpations: Abdomen is soft. There is no hepatomegaly or splenomegaly.     Tenderness: There is no abdominal tenderness. There is no right CVA tenderness, left CVA tenderness, guarding or rebound. Negative signs include Murphy's sign and McBurney's sign.     Hernia: No hernia is present.  Genitourinary:    Comments: Pt self swabs prior to exam Neurological:     Mental Status: She is alert.      UC Treatments / Results  Labs (all labs ordered are listed, but only abnormal results are displayed) Labs Reviewed   URINE CULTURE  HCG, QUANTITATIVE, PREGNANCY  URINALYSIS, DIPSTICK ONLY  CERVICOVAGINAL ANCILLARY ONLY    EKG   Radiology No results found.  Procedures Procedures (including critical care time)  Medications Ordered in UC Medications - No data to display  Initial Impression / Assessment and Plan / UC Course  I have reviewed the triage vital signs and the nursing notes.  Pertinent labs & imaging results that were available during my care of the patient were reviewed by me and considered in my medical decision making (see chart for details).     New. Labs pending. Discussed red flag signs and symptoms. Safe sex practices. Will treat accordingly with lab results. Follow up with her OB-GYN or PCP if symptoms continue or to the ER if worsened.   Final Clinical Impressions(s) / UC Diagnoses   Final diagnoses:  None   Discharge Instructions   None    ED Prescriptions    None     PDMP not reviewed this encounter.   Rushie Chestnut, PA-C 03/20/20 1135    Rushie Chestnut, PA-C 03/20/20 1143

## 2020-03-20 NOTE — ED Triage Notes (Signed)
Pt reports wanting an STI check. Pt states she has been experiencing abdominal cramping. She denies vaginal irritation, discharge and foul odor.

## 2020-03-21 LAB — URINE CULTURE: Culture: 30000 — AB

## 2020-03-21 LAB — CERVICOVAGINAL ANCILLARY ONLY
Bacterial Vaginitis (gardnerella): POSITIVE — AB
Candida Glabrata: NEGATIVE
Candida Vaginitis: NEGATIVE
Chlamydia: NEGATIVE
Comment: NEGATIVE
Comment: NEGATIVE
Comment: NEGATIVE
Comment: NEGATIVE
Comment: NEGATIVE
Comment: NORMAL
Neisseria Gonorrhea: NEGATIVE
Trichomonas: POSITIVE — AB

## 2020-03-22 ENCOUNTER — Telehealth (HOSPITAL_COMMUNITY): Payer: Self-pay | Admitting: Emergency Medicine

## 2020-03-22 MED ORDER — METRONIDAZOLE 500 MG PO TABS: 2000.0000 mg | ORAL_TABLET | Freq: Once | ORAL | 0 refills | Status: AC

## 2020-04-26 ENCOUNTER — Encounter (HOSPITAL_COMMUNITY): Payer: Self-pay

## 2020-04-26 ENCOUNTER — Ambulatory Visit (HOSPITAL_COMMUNITY)
Admission: EM | Admit: 2020-04-26 | Discharge: 2020-04-26 | Disposition: A | Discharge status: Home / Self Care | Payer: Self-pay | Attending: Student | Admitting: Student

## 2020-04-26 ENCOUNTER — Other Ambulatory Visit: Payer: Self-pay

## 2020-04-26 DIAGNOSIS — N760 Acute vaginitis: Secondary | ICD-10-CM | POA: Insufficient documentation

## 2020-04-26 DIAGNOSIS — Z113 Encounter for screening for infections with a predominantly sexual mode of transmission: Secondary | ICD-10-CM | POA: Insufficient documentation

## 2020-04-26 DIAGNOSIS — Z8619 Personal history of other infectious and parasitic diseases: Secondary | ICD-10-CM | POA: Insufficient documentation

## 2020-04-26 LAB — HIV ANTIBODY (ROUTINE TESTING W REFLEX): HIV Screen 4th Generation wRfx: NONREACTIVE

## 2020-04-26 NOTE — ED Triage Notes (Signed)
Pt presents with vaginal irritation X 2 days. 

## 2020-04-26 NOTE — Discharge Instructions (Addendum)
-  we will call you if any of the tests is positive and we can send treatment if necessary -we're testing for gonorrhea, chlamydia, trichomonas, BV, yeast, HIV, syphilis  -Seek additional medical treatment if you develop abdominal pain, worsening of vaginal symptoms, new urinary symptoms, etc

## 2020-04-26 NOTE — ED Provider Notes (Signed)
[facility]    CSN: [id number] Arrival date & time: 04/26/20  1514      History   Chief Complaint Chief Complaint  Patient presents with  . Vaginitis    HPI Janice White is a 30 y.o. female presenting with vaginitis. History asthma, trichomonas, vaginitis. Was last evaluated at this urgent care for vaginitis 1 month ago and was treated for BV and trichomonas at that time. States she felt better for 3 weeks but now with 2 days of vaginal irritation. Has been trying monistat OTC without improvement. Denies discharge, urinary symptoms, new partners since last STI screening. Denies hematuria, dysuria, frequency, urgency, back pain, n/v/d/abd pain, fevers/chills, abdnormal vaginal discharge, vaginal rashes, vaginal lesions.  HPI  Past Medical History:  Diagnosis Date  . Asthma   . PONV (postoperative nausea and vomiting)     Patient Active Problem List   Diagnosis Date Noted  . 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 Postpartum care following vaginal delivery 01/28/2018  . Normal labor and delivery 01/26/2018  . Irregular menstrual cycle 06/09/2016    Past Surgical History:  Procedure Laterality Date  . WISDOM TOOTH EXTRACTION      OB History    Gravida  1   Para  1   Term  1   Preterm  0   AB  0   Living  1     SAB  0   IAB  0   Ectopic  0   Multiple  0   Live Births  1            Home Medications    Prior to Admission medications   Medication Sig Start Date End Date Taking? Authorizing Provider  cetirizine (ZYRTEC ALLERGY) 10 MG tablet Take 1 tablet (10 mg total) by mouth daily. 02/20/20   Wallis Bamberg, PA-C  pseudoephedrine (SUDAFED) 30 MG tablet Take 1 tablet (30 mg total) by mouth every 8 (eight) hours as needed for congestion. 02/20/20   Wallis Bamberg, PA-C    Family History Family History  Problem Relation Age of Onset  . Diabetes Mother   . Hypertension Mother   . Multiple sclerosis Mother   . Heart disease Father   . Stroke Father    . Diabetes Maternal Grandmother     Social History Social History   Tobacco Use  . Smoking status: Never Smoker  . Smokeless tobacco: Never Used  Vaping Use  . Vaping Use: Never used  Substance Use Topics  . Alcohol use: No  . Drug use: No     Allergies   Penicillins   Review of Systems Review of Systems  Constitutional: Negative for chills and fever.  HENT: Negative for sore throat.   Eyes: Negative for pain and redness.  Respiratory: Negative for shortness of breath.   Cardiovascular: Negative for chest pain.  Gastrointestinal: Negative for abdominal pain, diarrhea, nausea and vomiting.  Genitourinary: Negative for decreased urine volume, difficulty urinating, dysuria, flank pain, frequency, genital sores, hematuria, menstrual problem, pelvic pain, urgency, vaginal bleeding, vaginal discharge and vaginal pain.       Vaginal irritation  Musculoskeletal: Negative for back pain.  Skin: Negative for rash.  All other systems reviewed and are negative.    Physical Exam Triage Vital Signs ED Triage Vitals [04/26/20 1524]  Enc Vitals Group     BP      Pulse      Resp      Temp      Temp src  SpO2      Weight      Height      Head Circumference      Peak Flow      Pain Score 1     Pain Loc      Pain Edu?      Excl. in GC?    No data found.  Updated Vital Signs BP 131/87 (BP Location: Right Arm)   Pulse 90   Temp 98.6 F (37 C) (Oral)   Resp 18   LMP 04/04/2020   SpO2 100%   Visual Acuity Right Eye Distance:   Left Eye Distance:   Bilateral Distance:    Right Eye Near:   Left Eye Near:    Bilateral Near:     Physical Exam Vitals reviewed.  Constitutional:      General: She is not in acute distress.    Appearance: Normal appearance. She is not ill-appearing.  HENT:     Head: Normocephalic and atraumatic.  Cardiovascular:     Rate and Rhythm: Normal rate and regular rhythm.     Heart sounds: Normal heart sounds.  Pulmonary:      Effort: Pulmonary effort is normal.     Breath sounds: Normal breath sounds. No wheezing, rhonchi or rales.  Abdominal:     General: Bowel sounds are normal. There is no distension.     Palpations: Abdomen is soft. There is no mass.     Tenderness: There is no abdominal tenderness. There is no right CVA tenderness, left CVA tenderness, guarding or rebound.  Neurological:     General: No focal deficit present.     Mental Status: She is alert and oriented to person, place, and time.  Psychiatric:        Mood and Affect: Mood normal.        Behavior: Behavior normal.      UC Treatments / Results  Labs (all labs ordered are listed, but only abnormal results are displayed) Labs Reviewed  HIV ANTIBODY (ROUTINE TESTING W REFLEX)  RPR  CERVICOVAGINAL ANCILLARY ONLY    EKG   Radiology No results found.  Procedures Procedures (including critical care time)  Medications Ordered in UC Medications - No data to display  Initial Impression / Assessment and Plan / UC Course  I have reviewed the triage vital signs and the nursing notes.  Pertinent labs & imaging results that were available during my care of the patient were reviewed by me and considered in my medical decision making (see chart for details).      This patient is a 30 year old female presenting with recurrent vaginitis. Last treated for this 1 month ago- was treated for BV and trichomonas.   Will send for G/C, trich, yeast, BV, HIV, RPR. Abstain until negative result. Will send treatment if necessary. States she could not be pregnant. Return precautions discussed.   This chart was dictated using voice recognition software, Dragon. Despite the best efforts of this provider to proofread and correct errors, errors may still occur which can change documentation meaning.   Final Clinical Impressions(s) / UC Diagnoses   Final diagnoses:  Vaginitis and vulvovaginitis  Routine screening for STI (sexually transmitted  infection)  History of trichomoniasis     Discharge Instructions     -we will call you if any of the tests is positive and we can send treatment if necessary -we're testing for gonorrhea, chlamydia, trichomonas, BV, yeast, HIV, syphilis  -Seek additional medical treatment if you develop  abdominal pain, worsening of vaginal symptoms, new urinary symptoms, etc    ED Prescriptions    None     PDMP not reviewed this encounter.   Rhys Martini, PA-C 04/26/20 [phone]

## 2020-04-27 LAB — RPR: RPR Ser Ql: NONREACTIVE

## 2020-04-28 LAB — CERVICOVAGINAL ANCILLARY ONLY
Bacterial Vaginitis (gardnerella): NEGATIVE
Candida Glabrata: NEGATIVE
Candida Vaginitis: NEGATIVE
Chlamydia: NEGATIVE
Comment: NEGATIVE
Comment: NEGATIVE
Comment: NEGATIVE
Comment: NEGATIVE
Comment: NEGATIVE
Comment: NORMAL
Neisseria Gonorrhea: NEGATIVE
Trichomonas: NEGATIVE

## 2020-05-22 ENCOUNTER — Ambulatory Visit
Admission: RE | Admit: 2020-05-22 | Discharge: 2020-05-22 | Disposition: A | Discharge status: Home / Self Care | Payer: Self-pay | Source: Ambulatory Visit | Attending: Emergency Medicine | Admitting: Emergency Medicine

## 2020-05-22 ENCOUNTER — Other Ambulatory Visit: Payer: Self-pay

## 2020-05-22 VITALS — BP 144/85 | HR 90 | Temp 98.6°F | Resp 16

## 2020-05-22 DIAGNOSIS — N760 Acute vaginitis: Secondary | ICD-10-CM | POA: Insufficient documentation

## 2020-05-22 LAB — POCT URINALYSIS DIP (MANUAL ENTRY)
Bilirubin, UA: NEGATIVE
Blood, UA: NEGATIVE
Glucose, UA: NEGATIVE mg/dL
Ketones, POC UA: NEGATIVE mg/dL
Nitrite, UA: NEGATIVE
Protein Ur, POC: NEGATIVE mg/dL
Spec Grav, UA: >=1.03 — AB (ref 1.010–1.025)
Urobilinogen, UA: 0.2 E.U./dL
pH, UA: 7 (ref 5.0–8.0)

## 2020-05-22 LAB — POCT URINE PREGNANCY: Preg Test, Ur: NEGATIVE

## 2020-05-22 MED ORDER — FLUCONAZOLE 150 MG PO TABS: 150.0000 mg | ORAL_TABLET | Freq: Once | ORAL | 0 refills | Status: AC

## 2020-05-22 NOTE — ED Triage Notes (Signed)
Pt here for vaginal discharge x 3 days 

## 2020-05-22 NOTE — ED Provider Notes (Signed)
[facility]    CSN: [id number] Arrival date & time: 05/22/20  1825      History   Chief Complaint Chief Complaint  Patient presents with  . Appointment    1900  . Vaginal Discharge    HPI Janice White is a 30 y.o. female history of asthma presenting today for evaluation of vaginal discharge and irritation.  Reports over the past 3 days has had a white discharge as well as vaginal irritation with occasional itching.  Denies any burning sensation.  Denies urinary symptoms.  Reports history of yeast and BV.  Reports symptoms feels more similar to yeast.  Currently breast-feeding 87-year-old.  HPI  Past Medical History:  Diagnosis Date  . Asthma   . PONV (postoperative nausea and vomiting)     Patient Active Problem List   Diagnosis Date Noted  . 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 Postpartum care following vaginal delivery 01/28/2018  . Normal labor and delivery 01/26/2018  . Irregular menstrual cycle 06/09/2016    Past Surgical History:  Procedure Laterality Date  . WISDOM TOOTH EXTRACTION      OB History    Gravida  1   Para  1   Term  1   Preterm  0   AB  0   Living  1     SAB  0   IAB  0   Ectopic  0   Multiple  0   Live Births  1            Home Medications    Prior to Admission medications   Medication Sig Start Date End Date Taking? Authorizing Provider  cetirizine (ZYRTEC ALLERGY) 10 MG tablet Take 1 tablet (10 mg total) by mouth daily. 02/20/20   Wallis Bamberg, PA-C  pseudoephedrine (SUDAFED) 30 MG tablet Take 1 tablet (30 mg total) by mouth every 8 (eight) hours as needed for congestion. 02/20/20   Wallis Bamberg, PA-C    Family History Family History  Problem Relation Age of Onset  . Diabetes Mother   . Hypertension Mother   . Multiple sclerosis Mother   . Heart disease Father   . Stroke Father   . Diabetes Maternal Grandmother     Social History Social History   Tobacco Use  . Smoking status: Never Smoker  .  Smokeless tobacco: Never Used  Vaping Use  . Vaping Use: Never used  Substance Use Topics  . Alcohol use: No  . Drug use: No     Allergies   Penicillins   Review of Systems Review of Systems  Constitutional: Negative for fever.  Respiratory: Negative for shortness of breath.   Cardiovascular: Negative for chest pain.  Gastrointestinal: Negative for abdominal pain, diarrhea, nausea and vomiting.  Genitourinary: Positive for vaginal discharge. Negative for dysuria, flank pain, genital sores, hematuria, menstrual problem, vaginal bleeding and vaginal pain.  Musculoskeletal: Negative for back pain.  Skin: Negative for rash.  Neurological: Negative for dizziness, light-headedness and headaches.     Physical Exam Triage Vital Signs ED Triage Vitals  Enc Vitals Group     BP 05/22/20 1914 (!) 144/85     Pulse Rate 05/22/20 1914 90     Resp 05/22/20 1914 16     Temp 05/22/20 1914 98.6 F (37 C)     Temp Source 05/22/20 1914 Oral     SpO2 05/22/20 1914 99 %     Weight --      Height --  Head Circumference --      Peak Flow --      Pain Score 05/22/20 1925 1     Pain Loc --      Pain Edu? --      Excl. in GC? --    No data found.  Updated Vital Signs BP (!) 144/85 (BP Location: Left Arm)   Pulse 90   Temp 98.6 F (37 C) (Oral)   Resp 16   SpO2 99%   Visual Acuity Right Eye Distance:   Left Eye Distance:   Bilateral Distance:    Right Eye Near:   Left Eye Near:    Bilateral Near:     Physical Exam Vitals and nursing note reviewed.  Constitutional:      Appearance: She is well-developed.     Comments: No acute distress  HENT:     Head: Normocephalic and atraumatic.     Nose: Nose normal.  Eyes:     Conjunctiva/sclera: Conjunctivae normal.  Cardiovascular:     Rate and Rhythm: Normal rate.  Pulmonary:     Effort: Pulmonary effort is normal. No respiratory distress.  Abdominal:     General: There is no distension.  Musculoskeletal:         General: Normal range of motion.     Cervical back: Neck supple.  Skin:    General: Skin is warm and dry.  Neurological:     Mental Status: She is alert and oriented to person, place, and time.      UC Treatments / Results  Labs (all labs ordered are listed, but only abnormal results are displayed) Labs Reviewed  POCT URINALYSIS DIP (MANUAL ENTRY) - Abnormal; Notable for the following components:      Result Value   Clarity, UA cloudy (*)    Spec Grav, UA >=1.030 (*)    Leukocytes, UA Small (1+) (*)    All other components within normal limits  URINE CULTURE  POCT URINE PREGNANCY  CERVICOVAGINAL ANCILLARY ONLY    EKG   Radiology No results found.  Procedures Procedures (including critical care time)  Medications Ordered in UC Medications - No data to display  Initial Impression / Assessment and Plan / UC Course  I have reviewed the triage vital signs and the nursing notes.  Pertinent labs & imaging results that were available during my care of the patient were reviewed by me and considered in my medical decision making (see chart for details).     UA with negative leuks and nitrites, more suspicious of vaginitis, will empirically treat for yeast based patient's history and will alter treatment based off vaginal swab results.  Discussed strict return precautions. Patient verbalized understanding and is agreeable with plan.  Final Clinical Impressions(s) / UC Diagnoses   Final diagnoses:  Vaginitis and vulvovaginitis     Discharge Instructions     We are testing you for Gonorrhea, Chlamydia, Trichomonas, Yeast and Bacterial Vaginosis. We will call you if anything is positive and let you know if you require any further treatment. Please inform partners of any positive results.   Please return if symptoms not improving with treatment, development of fever, nausea, vomiting, abdominal pain.     ED Prescriptions    Medication Sig Dispense Auth. Provider    fluconazole (DIFLUCAN) 150 MG tablet Take 1 tablet (150 mg total) by mouth once for 1 dose. 2 tablet Jamicheal Heard, [place] C, PA-C     PDMP not reviewed this encounter.   Aulton Routt, [employer]  C, PA-C 05/23/20 [id number]

## 2020-05-22 NOTE — Discharge Instructions (Addendum)
We are testing you for Gonorrhea, Chlamydia, Trichomonas, Yeast and Bacterial Vaginosis. We will call you if anything is positive and let you know if you require any further treatment. Please inform partners of any positive results.   Please return if symptoms not improving with treatment, development of fever, nausea, vomiting, abdominal pain.  

## 2020-05-24 LAB — CERVICOVAGINAL ANCILLARY ONLY
Bacterial Vaginitis (gardnerella): POSITIVE — AB
Candida Glabrata: NEGATIVE
Candida Vaginitis: POSITIVE — AB
Chlamydia: NEGATIVE
Comment: NEGATIVE
Comment: NEGATIVE
Comment: NEGATIVE
Comment: NEGATIVE
Comment: NEGATIVE
Comment: NORMAL
Neisseria Gonorrhea: NEGATIVE
Trichomonas: NEGATIVE

## 2020-05-25 LAB — URINE CULTURE

## 2020-05-27 ENCOUNTER — Telehealth (HOSPITAL_COMMUNITY): Payer: Self-pay | Admitting: Emergency Medicine

## 2020-05-27 MED ORDER — METRONIDAZOLE 0.75 % VA GEL: 1.0000 | Freq: Every day | VAGINAL | 0 refills | Status: AC

## 2020-06-26 ENCOUNTER — Encounter (HOSPITAL_COMMUNITY): Payer: Self-pay

## 2020-06-26 ENCOUNTER — Other Ambulatory Visit: Payer: Self-pay

## 2020-06-26 ENCOUNTER — Ambulatory Visit (HOSPITAL_COMMUNITY)
Admission: EM | Admit: 2020-06-26 | Discharge: 2020-06-26 | Disposition: A | Discharge status: Home / Self Care | Payer: Self-pay | Attending: Family Medicine | Admitting: Family Medicine

## 2020-06-26 DIAGNOSIS — J3089 Other allergic rhinitis: Secondary | ICD-10-CM

## 2020-06-26 DIAGNOSIS — J040 Acute laryngitis: Secondary | ICD-10-CM | POA: Insufficient documentation

## 2020-06-26 DIAGNOSIS — J302 Other seasonal allergic rhinitis: Secondary | ICD-10-CM | POA: Insufficient documentation

## 2020-06-26 DIAGNOSIS — Z880 Allergy status to penicillin: Secondary | ICD-10-CM | POA: Insufficient documentation

## 2020-06-26 DIAGNOSIS — Z20822 Contact with and (suspected) exposure to covid-19: Secondary | ICD-10-CM | POA: Insufficient documentation

## 2020-06-26 DIAGNOSIS — R059 Cough, unspecified: Secondary | ICD-10-CM | POA: Insufficient documentation

## 2020-06-26 DIAGNOSIS — J069 Acute upper respiratory infection, unspecified: Secondary | ICD-10-CM

## 2020-06-26 MED ORDER — FLUTICASONE PROPIONATE 50 MCG/ACT NA SUSP: 1.0000 | Freq: Two times a day (BID) | NASAL | 2 refills | Status: DC

## 2020-06-26 MED ORDER — CETIRIZINE HCL 10 MG PO TABS: 10.0000 mg | ORAL_TABLET | Freq: Every day | ORAL | 2 refills | Status: DC

## 2020-06-26 NOTE — ED Provider Notes (Signed)
[facility]    CSN: [id number] Arrival date & time: 06/26/20  1722      History   Chief Complaint Chief Complaint  Patient presents with  . Nasal Congestion  . scratchy throat  . Laryngitis  . Generalized Body Aches    HPI Janice White is a 30 y.o. female.   Presenting today for evaluation of several day history of scratchy throat, runny nose, hoarseness, mild cough.  Denies fever, chills, body aches, chest pain, shortness of breath, abdominal pain, nausea vomiting diarrhea.  Trying over-the-counter cough syrups with minimal relief.  History of seasonal allergies, not taking any allergy medication at this time.  No known sick contacts.    Past Medical History:  Diagnosis Date  . Asthma   . PONV (postoperative nausea and vomiting)     Patient Active Problem List   Diagnosis Date Noted  . 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 Postpartum care following vaginal delivery 01/28/2018  . Normal labor and delivery 01/26/2018  . Irregular menstrual cycle 06/09/2016    Past Surgical History:  Procedure Laterality Date  . WISDOM TOOTH EXTRACTION      OB History    Gravida  1   Para  1   Term  1   Preterm  0   AB  0   Living  1     SAB  0   IAB  0   Ectopic  0   Multiple  0   Live Births  1            Home Medications    Prior to Admission medications   Medication Sig Start Date End Date Taking? Authorizing Provider  fluticasone (FLONASE) 50 MCG/ACT nasal spray Place 1 spray into both nostrils in the morning and at bedtime. 06/26/20  Yes Particia Nearing, PA-C  cetirizine (ZYRTEC ALLERGY) 10 MG tablet Take 1 tablet (10 mg total) by mouth daily. 06/26/20   Particia Nearing, PA-C  pseudoephedrine (SUDAFED) 30 MG tablet Take 1 tablet (30 mg total) by mouth every 8 (eight) hours as needed for congestion. 02/20/20   Wallis Bamberg, PA-C    Family History Family History  Problem Relation Age of Onset  . Diabetes Mother   . Hypertension  Mother   . Multiple sclerosis Mother   . Heart disease Father   . Stroke Father   . Diabetes Maternal Grandmother     Social History Social History   Tobacco Use  . Smoking status: Never Smoker  . Smokeless tobacco: Never Used  Vaping Use  . Vaping Use: Never used  Substance Use Topics  . Alcohol use: No  . Drug use: No     Allergies   Penicillins   Review of Systems Review of Systems Per HPI  Physical Exam Triage Vital Signs ED Triage Vitals  Enc Vitals Group     BP 06/26/20 1819 112/73     Pulse Rate 06/26/20 1819 78     Resp 06/26/20 1819 18     Temp 06/26/20 1819 99.8 F (37.7 C)     Temp src --      SpO2 06/26/20 1819 97 %     Weight --      Height --      Head Circumference --      Peak Flow --      Pain Score 06/26/20 1814 3     Pain Loc --      Pain Edu? --  Excl. in GC? --    No data found.  Updated Vital Signs BP 112/73   Pulse 78   Temp 99.8 F (37.7 C)   Resp 18   LMP 05/28/2020 Comment: pt not certain of exact date but states was last month  SpO2 97%   Visual Acuity Right Eye Distance:   Left Eye Distance:   Bilateral Distance:    Right Eye Near:   Left Eye Near:    Bilateral Near:     Physical Exam Vitals and nursing note reviewed.  Constitutional:      Appearance: Normal appearance. She is not ill-appearing.  HENT:     Head: Atraumatic.     Right Ear: Tympanic membrane normal.     Left Ear: Tympanic membrane normal.     Nose: Rhinorrhea present.     Mouth/Throat:     Mouth: Mucous membranes are moist.     Pharynx: Posterior oropharyngeal erythema present.  Eyes:     Extraocular Movements: Extraocular movements intact.     Conjunctiva/sclera: Conjunctivae normal.  Cardiovascular:     Rate and Rhythm: Normal rate and regular rhythm.     Heart sounds: Normal heart sounds.  Pulmonary:     Effort: Pulmonary effort is normal. No respiratory distress.     Breath sounds: Normal breath sounds. No wheezing or rales.   Abdominal:     General: Bowel sounds are normal. There is no distension.     Palpations: Abdomen is soft.     Tenderness: There is no abdominal tenderness. There is no guarding.  Musculoskeletal:        General: Normal range of motion.     Cervical back: Normal range of motion and neck supple.  Skin:    General: Skin is warm and dry.  Neurological:     Mental Status: She is alert and oriented to person, place, and time.  Psychiatric:        Mood and Affect: Mood normal.        Thought Content: Thought content normal.        Judgment: Judgment normal.    UC Treatments / Results  Labs (all labs ordered are listed, but only abnormal results are displayed) Labs Reviewed  SARS CORONAVIRUS 2 (TAT 6-24 HRS)    EKG   Radiology No results found.  Procedures Procedures (including critical care time)  Medications Ordered in UC Medications - No data to display  Initial Impression / Assessment and Plan / UC Course  I have reviewed the triage vital signs and the nursing notes.  Pertinent labs & imaging results that were available during my care of the patient were reviewed by me and considered in my medical decision making (see chart for details).     Exam and vitals reassuring, COVID PCR pending.  Suspect related to seasonal allergies.  Will restart Zyrtec and Flonase regimen, discussed other over-the-counter symptomatic medications and supportive home care.  Work note given with isolation protocol.  Return for acutely worsening symptoms.  Final Clinical Impressions(s) / UC Diagnoses   Final diagnoses:  Viral URI with cough  Seasonal allergic rhinitis due to other allergic trigger   Discharge Instructions   None    ED Prescriptions    Medication Sig Dispense Auth. Provider   cetirizine (ZYRTEC ALLERGY) 10 MG tablet Take 1 tablet (10 mg total) by mouth daily. 30 tablet Particia Nearing, PA-C   fluticasone [facility]) 50 MCG/ACT nasal spray Place 1 spray into both  nostrils in  the morning and at bedtime. 16 g Particia Nearing, [place]     PDMP not reviewed this encounter.   Particia Nearing, [place] 06/26/20 1911

## 2020-06-26 NOTE — ED Triage Notes (Addendum)
Pt c/o sniffles, scratchy throat and voice coming and going for several days. Pt reports using cough syrup for symptoms.

## 2020-06-27 LAB — SARS CORONAVIRUS 2 (TAT 6-24 HRS): SARS Coronavirus 2: NEGATIVE

## 2020-07-17 ENCOUNTER — Other Ambulatory Visit: Payer: Self-pay

## 2020-07-17 ENCOUNTER — Ambulatory Visit
Admission: EM | Admit: 2020-07-17 | Discharge: 2020-07-17 | Disposition: A | Discharge status: Home / Self Care | Payer: No Typology Code available for payment source | Attending: Emergency Medicine | Admitting: Emergency Medicine

## 2020-07-17 DIAGNOSIS — R109 Unspecified abdominal pain: Secondary | ICD-10-CM | POA: Insufficient documentation

## 2020-07-17 DIAGNOSIS — Z113 Encounter for screening for infections with a predominantly sexual mode of transmission: Secondary | ICD-10-CM | POA: Insufficient documentation

## 2020-07-17 LAB — POCT URINALYSIS DIP (MANUAL ENTRY)
Bilirubin, UA: NEGATIVE
Blood, UA: NEGATIVE
Glucose, UA: NEGATIVE mg/dL
Ketones, POC UA: NEGATIVE mg/dL
Leukocytes, UA: NEGATIVE
Nitrite, UA: POSITIVE — AB
Protein Ur, POC: 30 mg/dL — AB
Spec Grav, UA: >=1.03 — AB (ref 1.010–1.025)
Urobilinogen, UA: 1 E.U./dL
pH, UA: 6.5 (ref 5.0–8.0)

## 2020-07-17 LAB — POCT URINE PREGNANCY: Preg Test, Ur: NEGATIVE

## 2020-07-17 MED ORDER — CEPHALEXIN 500 MG PO CAPS: 500.0000 mg | ORAL_CAPSULE | Freq: Two times a day (BID) | ORAL | 0 refills | Status: AC

## 2020-07-17 NOTE — ED Triage Notes (Signed)
Patient presents to Urgent Care with complaints of abdominal cramping and nausea x one week. Pt believes she has a UTI has noted smell from urine. She is concerned with STI and request STD/HIV testing.   Denies fever.

## 2020-07-17 NOTE — ED Provider Notes (Signed)
[facility]    CSN: [id number] Arrival date & time: 07/17/20  1008      History   Chief Complaint Chief Complaint  Patient presents with  . Abdominal Cramping  . Nausea    HPI Janice White is a 30 y.o. female history of asthma presenting today for evaluation of possible UTI.  Reports over the past week she has had abdominal cramping as well as associated nausea.  Reports foul odor with urine.  Denies odor otherwise except with urination.  Denies vaginal symptoms of discharge itching or irritation.  Would like to be screened for STDs as well.  Currently breast-feeding 57-year-old.  HPI  Past Medical History:  Diagnosis Date  . Asthma   . PONV (postoperative nausea and vomiting)     Patient Active Problem List   Diagnosis Date Noted  . 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 Postpartum care following vaginal delivery 01/28/2018  . Normal labor and delivery 01/26/2018  . Irregular menstrual cycle 06/09/2016    Past Surgical History:  Procedure Laterality Date  . WISDOM TOOTH EXTRACTION      OB History    Gravida  1   Para  1   Term  1   Preterm  0   AB  0   Living  1     SAB  0   IAB  0   Ectopic  0   Multiple  0   Live Births  1            Home Medications    Prior to Admission medications   Medication Sig Start Date End Date Taking? Authorizing Provider  cephALEXin (KEFLEX) 500 MG capsule Take 1 capsule (500 mg total) by mouth 2 (two) times daily for 5 days. 07/17/20 07/22/20 Yes Enis Leatherwood C, PA-C  cetirizine (ZYRTEC ALLERGY) 10 MG tablet Take 1 tablet (10 mg total) by mouth daily. 06/26/20   Particia Nearing, PA-C  fluticasone [facility]) 50 MCG/ACT nasal spray Place 1 spray into both nostrils in the morning and at bedtime. 06/26/20   Particia Nearing, PA-C    Family History Family History  Problem Relation Age of Onset  . Diabetes Mother   . Hypertension Mother   . Multiple sclerosis Mother   . Heart disease Father    . Stroke Father   . Diabetes Maternal Grandmother     Social History Social History   Tobacco Use  . Smoking status: Never Smoker  . Smokeless tobacco: Never Used  Vaping Use  . Vaping Use: Never used  Substance Use Topics  . Alcohol use: No  . Drug use: No     Allergies   Penicillins   Review of Systems Review of Systems  Constitutional: Negative for fever.  Respiratory: Negative for shortness of breath.   Cardiovascular: Negative for chest pain.  Gastrointestinal: Negative for abdominal pain, diarrhea, nausea and vomiting.  Genitourinary: Negative for dysuria, flank pain, genital sores, hematuria, menstrual problem, vaginal bleeding, vaginal discharge and vaginal pain.  Musculoskeletal: Negative for back pain.  Skin: Negative for rash.  Neurological: Negative for dizziness, light-headedness and headaches.     Physical Exam Triage Vital Signs ED Triage Vitals  Enc Vitals Group     BP 07/17/20 1116 130/77     Pulse Rate 07/17/20 1116 85     Resp --      Temp --      Temp Source 07/17/20 1116 Oral     SpO2 07/17/20 1116 98 %  Weight --      Height --      Head Circumference --      Peak Flow --      Pain Score 07/17/20 1115 0     Pain Loc --      Pain Edu? --      Excl. in GC? --    No data found.  Updated Vital Signs BP 130/77 (BP Location: Left Arm)   Pulse 85   LMP 07/02/2020   SpO2 98%   Visual Acuity Right Eye Distance:   Left Eye Distance:   Bilateral Distance:    Right Eye Near:   Left Eye Near:    Bilateral Near:     Physical Exam Vitals and nursing note reviewed.  Constitutional:      Appearance: She is well-developed.     Comments: No acute distress  HENT:     Head: Normocephalic and atraumatic.     Nose: Nose normal.  Eyes:     Conjunctiva/sclera: Conjunctivae normal.  Cardiovascular:     Rate and Rhythm: Normal rate.  Pulmonary:     Effort: Pulmonary effort is normal. No respiratory distress.  Abdominal:      General: There is no distension.  Musculoskeletal:        General: Normal range of motion.     Cervical back: Neck supple.  Skin:    General: Skin is warm and dry.  Neurological:     Mental Status: She is alert and oriented to person, place, and time.      UC Treatments / Results  Labs (all labs ordered are listed, but only abnormal results are displayed) Labs Reviewed  POCT URINALYSIS DIP (MANUAL ENTRY) - Abnormal; Notable for the following components:      Result Value   Clarity, UA cloudy (*)    Spec Grav, UA >=1.030 (*)    Protein Ur, POC =30 (*)    Nitrite, UA Positive (*)    All other components within normal limits  URINE CULTURE  HIV ANTIBODY (ROUTINE TESTING W REFLEX)  RPR  POCT URINE PREGNANCY  CERVICOVAGINAL ANCILLARY ONLY    EKG   Radiology No results found.  Procedures Procedures (including critical care time)  Medications Ordered in UC Medications - No data to display  Initial Impression / Assessment and Plan / UC Course  I have reviewed the triage vital signs and the nursing notes.  Pertinent labs & imaging results that were available during my care of the patient were reviewed by me and considered in my medical decision making (see chart for details).    Positive nitrites on UA, will send for urine culture, empirically treating for UTI today with Keflex, will also screen for STDs with vaginal swab and blood work for HIV and syphilis.  We will call with results and alter therapy as needed based off results.  Discussed strict return precautions. Patient verbalized understanding and is agreeable with plan.  Final Clinical Impressions(s) / UC Diagnoses   Final diagnoses:  Screen for STD (sexually transmitted disease)  Abdominal cramping     Discharge Instructions     Begin Keflex twice daily for 5 days to treat UTI Urine culture and vaginal swab pending Blood work to screen for HIV and syphilis pending Drink plenty of water Follow-up if  not improving or worsening    ED Prescriptions    Medication Sig Dispense Auth. Provider   cephALEXin (KEFLEX) 500 MG capsule Take 1 capsule (500 mg total) by  mouth 2 (two) times daily for 5 days. 10 capsule Lean Jaeger, [place] C, PA-C     PDMP not reviewed this encounter.   Lew Dawes, PA-C 07/17/20 1235

## 2020-07-17 NOTE — Discharge Instructions (Signed)
Begin Keflex twice daily for 5 days to treat UTI Urine culture and vaginal swab pending Blood work to screen for HIV and syphilis pending Drink plenty of water Follow-up if not improving or worsening

## 2020-07-18 LAB — CERVICOVAGINAL ANCILLARY ONLY
Bacterial Vaginitis (gardnerella): NEGATIVE
Candida Glabrata: NEGATIVE
Candida Vaginitis: NEGATIVE
Chlamydia: NEGATIVE
Comment: NEGATIVE
Comment: NEGATIVE
Comment: NEGATIVE
Comment: NEGATIVE
Comment: NEGATIVE
Comment: NORMAL
Neisseria Gonorrhea: NEGATIVE
Trichomonas: NEGATIVE

## 2020-07-18 LAB — HIV ANTIBODY (ROUTINE TESTING W REFLEX): HIV Screen 4th Generation wRfx: NONREACTIVE

## 2020-07-18 LAB — RPR: RPR Ser Ql: NONREACTIVE

## 2020-07-19 LAB — URINE CULTURE: Culture: >=100000 — AB

## 2020-08-11 ENCOUNTER — Other Ambulatory Visit: Payer: Self-pay

## 2020-08-11 ENCOUNTER — Encounter: Payer: Self-pay | Admitting: Emergency Medicine

## 2020-08-11 ENCOUNTER — Ambulatory Visit
Admission: EM | Admit: 2020-08-11 | Discharge: 2020-08-11 | Disposition: A | Discharge status: Home / Self Care | Payer: No Typology Code available for payment source | Attending: Emergency Medicine | Admitting: Emergency Medicine

## 2020-08-11 DIAGNOSIS — N760 Acute vaginitis: Secondary | ICD-10-CM | POA: Insufficient documentation

## 2020-08-11 LAB — POCT URINALYSIS DIP (MANUAL ENTRY)
Bilirubin, UA: NEGATIVE
Blood, UA: NEGATIVE
Glucose, UA: NEGATIVE mg/dL
Nitrite, UA: NEGATIVE
Protein Ur, POC: 100 mg/dL — AB
Spec Grav, UA: 1.025 (ref 1.010–1.025)
Urobilinogen, UA: 0.2 E.U./dL
pH, UA: 7.5 (ref 5.0–8.0)

## 2020-08-11 LAB — POCT URINE PREGNANCY: Preg Test, Ur: NEGATIVE

## 2020-08-11 MED ORDER — FLUCONAZOLE 150 MG PO TABS: 150.0000 mg | ORAL_TABLET | Freq: Once | ORAL | 0 refills | Status: AC

## 2020-08-11 NOTE — ED Triage Notes (Signed)
Pt here for vaginal discharge and some dysuria

## 2020-08-11 NOTE — ED Provider Notes (Signed)
[facility]    CSN: [id number] Arrival date & time: 08/11/20  1002      History   Chief Complaint Chief Complaint  Patient presents with   Vaginal Discharge    HPI Janice White is a 30 y.o. female presenting today for evaluation of discharge.  Reports vaginal discharge yesterday with associated irritation which has been intermittent throughout the day, but worsens with urination.  Recent UTI earlier in June and completed course of Keflex.  Has had slight abdominal cramping.  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 Authorizing Provider  fluconazole (DIFLUCAN) 150 MG tablet Take 1 tablet (150 mg total) by mouth once for 1 dose. 08/11/20 08/11/20 Yes Ines Warf C, PA-C  cetirizine (ZYRTEC ALLERGY) 10 MG tablet Take 1 tablet (10 mg total) by mouth daily. 06/26/20   Particia Nearing, PA-C  fluticasone [facility]) 50 MCG/ACT nasal spray Place 1 spray into both nostrils in the morning and at bedtime. 06/26/20   Particia Nearing, PA-C    Family History Family History  Problem Relation Age of Onset   Diabetes Mother    Hypertension Mother    Multiple sclerosis Mother    Heart disease Father    Stroke Father    Diabetes Maternal Grandmother     Social History Social History   Tobacco Use   Smoking status:  Never   Smokeless tobacco: Never  Vaping Use   Vaping Use: Never used  Substance Use Topics   Alcohol use: No   Drug use: No     Allergies   Penicillins   Review of Systems Review of Systems  Constitutional:  Negative for fever.  Respiratory:  Negative for shortness of breath.   Cardiovascular:  Negative for chest pain.  Gastrointestinal:  Negative for abdominal pain, diarrhea, nausea and vomiting.  Genitourinary:  Positive for dysuria and vaginal discharge. Negative for flank pain, genital sores, hematuria, menstrual problem, vaginal bleeding and vaginal pain.  Musculoskeletal:  Negative for back pain.  Skin:  Negative for rash.  Neurological:  Negative for dizziness, light-headedness and headaches.    Physical Exam Triage Vital Signs ED Triage Vitals [08/11/20 1129]  Enc Vitals Group     BP 112/61     Pulse Rate 82     Resp 18     Temp 98.1 F (36.7 C)     Temp Source Oral     SpO2 99 %     Weight      Height      Head Circumference  Peak Flow      Pain Score 3     Pain Loc      Pain Edu?      Excl. in GC?    No data found.  Updated Vital Signs BP 112/61 (BP Location: Left Arm)   Pulse 82   Temp 98.1 F (36.7 C) (Oral)   Resp 18   SpO2 99%   Visual Acuity Right Eye Distance:   Left Eye Distance:   Bilateral Distance:    Right Eye Near:   Left Eye Near:    Bilateral Near:     Physical Exam Vitals and nursing note reviewed.  Constitutional:      Appearance: She is well-developed.     Comments: No acute distress  HENT:     Head: Normocephalic and atraumatic.     Nose: Nose normal.  Eyes:     Conjunctiva/sclera: Conjunctivae normal.  Cardiovascular:     Rate and Rhythm: Normal rate.  Pulmonary:     Effort: Pulmonary effort is normal. No respiratory distress.  Abdominal:     General: There is no distension.  Musculoskeletal:        General: Normal range of motion.     Cervical back: Neck supple.  Skin:    General: Skin is warm and  dry.  Neurological:     Mental Status: She is alert and oriented to person, place, and time.     UC Treatments / Results  Labs (all labs ordered are listed, but only abnormal results are displayed) Labs Reviewed  POCT URINALYSIS DIP (MANUAL ENTRY) - Abnormal; Notable for the following components:      Result Value   Clarity, UA hazy (*)    Ketones, POC UA trace (5) (*)    Protein Ur, POC =100 (*)    Leukocytes, UA Small (1+) (*)    All other components within normal limits  URINE CULTURE  POCT URINE PREGNANCY    EKG   Radiology No results found.  Procedures Procedures (including critical care time)  Medications Ordered in UC Medications - No data to display  Initial Impression / Assessment and Plan / UC Course  I have reviewed the triage vital signs and the nursing notes.  Pertinent labs & imaging results that were available during my care of the patient were reviewed by me and considered in my medical decision making (see chart for details).     Vaginal irritation-most suspicious of yeast given recent antibiotic use, opting to treat with Diflucan, small leuks on UA, will send for urine culture to more definitively rule out recurrent UTI, will call with results and provide further treatment as needed.  Discussed strict return precautions. Patient verbalized understanding and is agreeable with plan.  Final Clinical Impressions(s) / UC Diagnoses   Final diagnoses:  Vaginitis and vulvovaginitis     Discharge Instructions      1 tablet of Diflucan today, repeat in 72 hours if swab positive for yeast and still having symptoms We will call with results of urine culture and swab and provide further treatment if needed     ED Prescriptions     Medication Sig Dispense Auth. Provider   fluconazole (DIFLUCAN) 150 MG tablet Take 1 tablet (150 mg total) by mouth once for 1 dose. 2 tablet Aniella Wandrey, [place] C, PA-C      PDMP not reviewed this encounter.   Sharyon Cable  [place] C, PA-C 08/11/20 1222

## 2020-08-11 NOTE — Discharge Instructions (Addendum)
1 tablet of Diflucan today, repeat in 72 hours if swab positive for yeast and still having symptoms We will call with results of urine culture and swab and provide further treatment if needed

## 2020-08-13 LAB — URINE CULTURE

## 2020-08-22 ENCOUNTER — Other Ambulatory Visit: Payer: Self-pay

## 2020-08-22 ENCOUNTER — Ambulatory Visit
Admission: EM | Admit: 2020-08-22 | Discharge: 2020-08-22 | Disposition: A | Discharge status: Home / Self Care | Payer: No Typology Code available for payment source | Attending: Physician Assistant | Admitting: Physician Assistant

## 2020-08-22 ENCOUNTER — Encounter: Payer: Self-pay | Admitting: Emergency Medicine

## 2020-08-22 DIAGNOSIS — J392 Other diseases of pharynx: Secondary | ICD-10-CM | POA: Insufficient documentation

## 2020-08-22 DIAGNOSIS — Z113 Encounter for screening for infections with a predominantly sexual mode of transmission: Secondary | ICD-10-CM | POA: Diagnosis not present

## 2020-08-22 DIAGNOSIS — N898 Other specified noninflammatory disorders of vagina: Secondary | ICD-10-CM | POA: Diagnosis not present

## 2020-08-22 LAB — POCT URINALYSIS DIP (MANUAL ENTRY)
Bilirubin, UA: NEGATIVE
Blood, UA: NEGATIVE
Glucose, UA: NEGATIVE mg/dL
Leukocytes, UA: NEGATIVE
Nitrite, UA: NEGATIVE
Protein Ur, POC: 30 mg/dL — AB
Spec Grav, UA: >=1.03 — AB (ref 1.010–1.025)
Urobilinogen, UA: 0.2 E.U./dL
pH, UA: 6 (ref 5.0–8.0)

## 2020-08-22 LAB — POCT RAPID STREP A (OFFICE): Rapid Strep A Screen: NEGATIVE

## 2020-08-22 LAB — POCT URINE PREGNANCY: Preg Test, Ur: NEGATIVE

## 2020-08-22 NOTE — ED Provider Notes (Signed)
[facility]    CSN: [id number] Arrival date & time: 08/22/20  1710      History   Chief Complaint Chief Complaint  Patient presents with   Sore Throat   SEXUALLY TRANSMITTED DISEASE    HPI Janice White is a 30 y.o. female.   Patient presents today with a several day history of discomfort in her throat.  She denies any significant soreness but states something does not feel right and as though there is a scratchy/drainage sensation.  She denies any additional symptoms including sinus pressure, nasal congestion, fever, cough, chest pain, shortness of breath.  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  She has not tried any over-the-counter medications for symptom management.  Reports she is able to eat and drink normally.  Denies any dysphagia, odynophagia, muffled voice, fever.  She does have a history of seasonal allergies and is prescribed Zyrtec and Flonase but has not been using these medications recently.  Denies any recent antibiotic use.  In addition, patient is requesting STI testing.  Reports that she had an unprotected sexual encounter several days ago and has had uncomfortable feeling since that time.  She denies any significant vaginal pain or vaginal discharge.  She denies any recent antibiotic use.  She does report potentially changing her soap which could be contributing to symptoms.  She has no concern for pregnancy but is interested in testing just to make sure.   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 Authorizing Provider  cetirizine (ZYRTEC ALLERGY) 10 MG tablet Take 1 tablet (10 mg total) by mouth daily. 06/26/20  Yes Particia Nearing, PA-C  fluticasone [facility]) 50 MCG/ACT nasal spray Place 1 spray into both nostrils in the morning and at bedtime. 06/26/20  Yes Particia Nearing, PA-C    Family History Family History  Problem Relation Age of Onset   Diabetes Mother    Hypertension Mother    Multiple sclerosis Mother    Heart disease Father    Stroke Father    Diabetes Maternal Grandmother     Social History Social History   Tobacco Use   Smoking status: Never   Smokeless tobacco: Never  Vaping Use   Vaping Use: Never used  Substance Use Topics   Alcohol use: No   Drug use: No     Allergies   Penicillins   Review of Systems Review of  Systems  Constitutional:  Negative for activity change, appetite change, fatigue and fever.  HENT:  Negative for congestion, sinus pressure, sneezing and sore throat (discomfort but not sore).   Respiratory:  Negative for cough and shortness of breath.   Cardiovascular:  Negative for chest pain.  Gastrointestinal:  Negative for abdominal pain, diarrhea, nausea and vomiting.  Genitourinary:  Negative for dysuria, frequency, pelvic pain, urgency, vaginal bleeding, vaginal discharge and vaginal pain (discomfort).  Neurological:  Negative for dizziness, light-headedness and headaches.    Physical Exam Triage Vital Signs ED Triage Vitals  Enc Vitals Group     BP 08/22/20 1744 126/84     Pulse Rate 08/22/20 1744 91     Resp 08/22/20 1744 14     Temp 08/22/20 1744 98.5 F (36.9 C)      Temp Source 08/22/20 1744 Oral     SpO2 08/22/20 1744 99 %     Weight --      Height --      Head Circumference --      Peak Flow --      Pain Score 08/22/20 1745 0     Pain Loc --      Pain Edu? --      Excl. in GC? --    No data found.  Updated Vital Signs BP 126/84 (BP Location: Left Arm)   Pulse 91   Temp 98.5 F (36.9 C) (Oral)   Resp 14   LMP 07/29/2020   SpO2 99%   Visual Acuity Right Eye Distance:   Left Eye Distance:   Bilateral Distance:    Right Eye Near:   Left Eye Near:    Bilateral Near:     Physical Exam Vitals reviewed.  Constitutional:      General: She is awake. She is not in acute distress.    Appearance: Normal appearance. She is normal weight. She is not ill-appearing.     Comments: Very pleasant female appears stated age in no acute distress sitting comfortably in exam room  HENT:     Head: Normocephalic and atraumatic.     Right Ear: Tympanic membrane, ear canal and external ear normal. Tympanic membrane is not erythematous or bulging.     Left Ear: Tympanic membrane, ear canal and external ear normal. Tympanic membrane is not erythematous or bulging.     Nose:     Right Sinus: No maxillary sinus tenderness or frontal sinus tenderness.     Left Sinus: No maxillary sinus tenderness or frontal sinus tenderness.     Mouth/Throat:     Pharynx: Uvula midline. No oropharyngeal exudate or posterior oropharyngeal erythema.     Tonsils: No tonsillar exudate or tonsillar abscesses. 1+ on the right. 1+ on the left.     Comments: Mild drainage present posterior oropharynx. Cardiovascular:     Rate and Rhythm: Normal rate and regular rhythm.     Heart sounds: Normal heart sounds, S1 normal and S2 normal. No murmur heard. Pulmonary:     Effort: Pulmonary effort is normal.     Breath sounds: Normal breath sounds. No wheezing, rhonchi or rales.     Comments: Clear to auscultation bilaterally Abdominal:     General: Bowel sounds are normal.      Palpations: Abdomen is soft.     Tenderness: There is no abdominal tenderness.  Genitourinary:    Comments: Exam deferred Lymphadenopathy:     Head:     Right side of head: No submental, submandibular  or tonsillar adenopathy.     Left side of head: No submental, submandibular or tonsillar adenopathy.     Cervical: No cervical adenopathy.  Psychiatric:        Behavior: Behavior is cooperative.     UC Treatments / Results  Labs (all labs ordered are listed, but only abnormal results are displayed) Labs Reviewed  POCT URINALYSIS DIP (MANUAL ENTRY) - Abnormal; Notable for the following components:      Result Value   Clarity, UA cloudy (*)    Ketones, POC UA small (15) (*)    Spec Grav, UA >=1.030 (*)    Protein Ur, POC =30 (*)    All other components within normal limits  NOVEL CORONAVIRUS, NAA  URINE CULTURE  CULTURE, GROUP A STREP [facility])  POCT RAPID STREP A (OFFICE)  POCT URINE PREGNANCY  CERVICOVAGINAL ANCILLARY ONLY    EKG   Radiology No results found.  Procedures Procedures (including critical care time)  Medications Ordered in UC Medications - No data to display  Initial Impression / Assessment and Plan / UC Course  I have reviewed the triage vital signs and the nursing notes.  Pertinent labs & imaging results that were available during my care of the patient were reviewed by me and considered in my medical decision making (see chart for details).      Strep testing was negative in clinic today.  Throat culture obtained-results pending.  COVID-19 test is pending.  No evidence of acute infection that warrant initiation of antibiotics.  Patient was instructed to use gargling with warm salt water as well as previously prescribed allergy medication to manage symptoms.  Discussed alarm symptoms that warrant emergent evaluation.  Strict return precautions given to which patient expressed understanding.  UA showed no evidence of infection.  Urine culture  obtained-results pending.  STI swab pending.  Patient was encouraged to use hypoallergenic soaps and detergents.  We will contact her if treatment is required.  Discussed alarm symptoms that warrant emergent evaluation.  Strict return precaution given to which patient expressed understanding.  Final Clinical Impressions(s) / UC Diagnoses   Final diagnoses:  Throat irritation  Vaginal irritation  Routine screening for STI (sexually transmitted infection)     Discharge Instructions      Your strep test was negative.  We will contact you if your throat culture or your COVID-19 test is positive.  Restart your allergy medication as we discussed.  Gargle with warm salt water.  If you have any worsening symptoms please return.  Your urine looked as though you had not eaten much but did not show signs of infection.  We will send this off for culture and if you have a urinary tract infection contact you to start antibiotics.  We will contact you if any treatment is required based on your swab.     ED Prescriptions   None    PDMP not reviewed this encounter.   Jeani Hawking, PA-C 08/22/20 1853

## 2020-08-22 NOTE — ED Triage Notes (Signed)
Throat "is not sore, but feels kinda funny" x 2 days. Asking to be swabbed for COVID and Strep. Also asking for an STD check due to unprotected sex x 2 days prior and "it's not pain, but something just doesn't feel right."

## 2020-08-22 NOTE — Discharge Instructions (Addendum)
Your strep test was negative.  We will contact you if your throat culture or your COVID-19 test is positive.  Restart your allergy medication as we discussed.  Gargle with warm salt water.  If you have any worsening symptoms please return.  Your urine looked as though you had not eaten much but did not show signs of infection.  We will send this off for culture and if you have a urinary tract infection contact you to start antibiotics.  We will contact you if any treatment is required based on your swab.

## 2020-08-23 LAB — CERVICOVAGINAL ANCILLARY ONLY
Bacterial Vaginitis (gardnerella): NEGATIVE
Candida Glabrata: NEGATIVE
Candida Vaginitis: NEGATIVE
Chlamydia: NEGATIVE
Comment: NEGATIVE
Comment: NEGATIVE
Comment: NEGATIVE
Comment: NEGATIVE
Comment: NEGATIVE
Comment: NORMAL
Neisseria Gonorrhea: NEGATIVE
Trichomonas: NEGATIVE

## 2020-08-23 LAB — NOVEL CORONAVIRUS, NAA: SARS-CoV-2, NAA: NOT DETECTED

## 2020-08-23 LAB — SARS-COV-2, NAA 2 DAY TAT

## 2020-08-26 ENCOUNTER — Telehealth (HOSPITAL_COMMUNITY): Payer: Self-pay | Admitting: Emergency Medicine

## 2020-08-26 LAB — URINE CULTURE: Culture: 50000 — AB

## 2020-08-26 LAB — CULTURE, GROUP A STREP (THRC)

## 2020-08-26 MED ORDER — NITROFURANTOIN MONOHYD MACRO 100 MG PO CAPS
100.0000 mg | ORAL_CAPSULE | Freq: Two times a day (BID) | ORAL | 0 refills | Status: DC
Start: 2020-08-26 — End: 2020-10-02

## 2020-08-29 ENCOUNTER — Ambulatory Visit (HOSPITAL_COMMUNITY)
Admission: EM | Admit: 2020-08-29 | Discharge: 2020-08-29 | Disposition: A | Discharge status: Home / Self Care | Payer: No Typology Code available for payment source | Attending: Urgent Care | Admitting: Urgent Care

## 2020-08-29 ENCOUNTER — Encounter (HOSPITAL_COMMUNITY): Payer: Self-pay | Admitting: Emergency Medicine

## 2020-08-29 ENCOUNTER — Other Ambulatory Visit: Payer: Self-pay

## 2020-08-29 DIAGNOSIS — R070 Pain in throat: Secondary | ICD-10-CM

## 2020-08-29 DIAGNOSIS — R0982 Postnasal drip: Secondary | ICD-10-CM | POA: Diagnosis not present

## 2020-08-29 LAB — POCT RAPID STREP A, ED / UC: Streptococcus, Group A Screen (Direct): NEGATIVE

## 2020-08-29 MED ORDER — FLUTICASONE PROPIONATE 50 MCG/ACT NA SUSP: 1.0000 | Freq: Two times a day (BID) | NASAL | 2 refills | Status: DC

## 2020-08-29 MED ORDER — PSEUDOEPHEDRINE HCL 60 MG PO TABS: 60.0000 mg | ORAL_TABLET | Freq: Three times a day (TID) | ORAL | 0 refills | Status: DC | PRN

## 2020-08-29 MED ORDER — CETIRIZINE HCL 10 MG PO TABS: 10.0000 mg | ORAL_TABLET | Freq: Every day | ORAL | 0 refills | Status: DC

## 2020-08-29 NOTE — ED Triage Notes (Signed)
Patient c/o sore throat x 1 week.   Patient endorses symptoms have progressively become worst.   Patient endorses fatigue, painful swallowing, and hoarse voice.   Patient was seen on 7/7 at [facility] for the same symptoms. Patient reports a COVID and Strep Test was performed.   Patient request a re-swab for strep.   Patient has tried warm fluids with no relief of symptoms.

## 2020-08-29 NOTE — ED Provider Notes (Signed)
[facility]   MRN: [id number] DOB: April 09, 1990  Subjective:   Janice White is a 30 y.o. female presenting for 1 week history of persistent throat pain, painful swallowing.  Patient had an office visit on 08/22/2020 for the same.  She had a COVID test and strep testing done.  Negative including culture.  Denies fever, sinus pain, runny or stuffy nose, cough, chest pain, shortness of breath.  Wants to make sure that she gets checked again for strep.  She is drinking warm fluids but no other medications for her symptoms.  No current facility-administered medications for this encounter.  Current Outpatient Medications:    nitrofurantoin, macrocrystal-monohydrate, (MACROBID) 100 MG capsule, Take 1 capsule (100 mg total) by mouth 2 (two) times daily., Disp: 10 capsule, Rfl: 0   cetirizine (ZYRTEC ALLERGY) 10 MG tablet, Take 1 tablet (10 mg total) by mouth daily., Disp: 30 tablet, Rfl: 2   fluticasone (FLONASE) 50 MCG/ACT nasal spray, Place 1 spray into both nostrils in the morning and at bedtime., Disp: 16 g, Rfl: 2   Allergies  Allergen Reactions   Penicillins Itching    Has patient had a PCN reaction causing immediate rash, facial/tongue/throat swelling, SOB or lightheadedness with hypotension: No Has patient had a PCN reaction causing severe rash involving mucus membranes or skin necrosis: No Has patient had a PCN reaction that required hospitalization No Has patient had a PCN reaction occurring within the last 10 years: Yes If all of the above answers are "NO", then may proceed with Cephalosporin use.     Past Medical History:  Diagnosis Date   Asthma    PONV (postoperative nausea and vomiting)      Past Surgical History:  Procedure Laterality Date   WISDOM TOOTH EXTRACTION      Family History  Problem Relation Age of Onset   Diabetes Mother    Hypertension Mother    Multiple sclerosis Mother    Heart disease Father    Stroke Father    Diabetes  Maternal Grandmother     Social History   Tobacco Use   Smoking status: Never   Smokeless tobacco: Never  Vaping Use   Vaping Use: Never used  Substance Use Topics   Alcohol use: No   Drug use: No    ROS   Objective:   Vitals: BP 125/85 (BP Location: Left Arm)   Pulse 85   Temp 98.7 F (37.1 C) (Oral)   Resp 14   LMP 08/26/2020 (Exact Date)   SpO2 99%   Physical Exam Constitutional:      General: She is not in acute distress.    Appearance: She is well-developed. She is not ill-appearing.  HENT:     Head: Normocephalic and atraumatic.     Right Ear: Tympanic membrane and ear canal normal. No drainage or tenderness. No middle ear effusion. Tympanic membrane is not erythematous.     Left Ear: Tympanic membrane and ear canal normal. No drainage or tenderness.  No middle ear effusion. Tympanic membrane is not erythematous.     Nose: No congestion or rhinorrhea.     Mouth/Throat:     Mouth: Mucous membranes are moist. No oral lesions.     Pharynx: No pharyngeal swelling, oropharyngeal exudate, posterior oropharyngeal erythema or uvula swelling.     Tonsils: No tonsillar exudate or tonsillar abscesses.     Comments: Postnasal drainage overlying pharynx in cobblestone pattern. Eyes:     Extraocular Movements:  Right eye: Normal extraocular motion.     Left eye: Normal extraocular motion.     Conjunctiva/sclera: Conjunctivae normal.     Pupils: Pupils are equal, round, and reactive to light.  Cardiovascular:     Rate and Rhythm: Normal rate.  Pulmonary:     Effort: Pulmonary effort is normal.  Musculoskeletal:     Cervical back: Normal range of motion and neck supple.  Lymphadenopathy:     Cervical: No cervical adenopathy.  Skin:    General: Skin is warm and dry.  Neurological:     General: No focal deficit present.     Mental Status: She is alert and oriented to person, place, and time.  Psychiatric:        Mood and Affect: Mood normal.        Behavior:  Behavior normal.    Results for orders placed or performed during the hospital encounter of 08/29/20 (from the past 24 hour(s))  POCT Rapid Strep A     Status: None   Collection Time: 08/29/20  1:49 PM  Result Value Ref Range   Streptococcus, Group A Screen (Direct) NEGATIVE NEGATIVE    Assessment and Plan :   PDMP not reviewed this encounter.  1. Throat pain   2. Post-nasal drainage     Patient afebrile, has no other sinus symptoms, has very clear thick postnasal drainage overlying her pharynx.  Recommended supportive care for this including Flonase, Zyrtec, pseudoephedrine, fluids. Strep culture pending. Counseled patient on potential for adverse effects with medications prescribed/recommended today, ER and return-to-clinic precautions discussed, patient verbalized understanding.    Wallis Bamberg, PA-C 08/29/20 1430

## 2020-08-30 ENCOUNTER — Emergency Department (HOSPITAL_COMMUNITY)
Admission: EM | Admit: 2020-08-30 | Discharge: 2020-08-30 | Disposition: A | Discharge status: Home / Self Care | Payer: No Typology Code available for payment source | Attending: Emergency Medicine | Admitting: Emergency Medicine

## 2020-08-30 ENCOUNTER — Other Ambulatory Visit: Payer: Self-pay

## 2020-08-30 ENCOUNTER — Encounter (HOSPITAL_COMMUNITY): Payer: Self-pay | Admitting: Emergency Medicine

## 2020-08-30 DIAGNOSIS — J029 Acute pharyngitis, unspecified: Secondary | ICD-10-CM | POA: Insufficient documentation

## 2020-08-30 DIAGNOSIS — J45909 Unspecified asthma, uncomplicated: Secondary | ICD-10-CM | POA: Diagnosis not present

## 2020-08-30 DIAGNOSIS — R0602 Shortness of breath: Secondary | ICD-10-CM | POA: Insufficient documentation

## 2020-08-30 MED ORDER — LIDOCAINE VISCOUS HCL 2 % MT SOLN: 15.0000 mL | Freq: Four times a day (QID) | OROMUCOSAL | 0 refills | Status: DC | PRN

## 2020-08-30 NOTE — ED Triage Notes (Signed)
Patient reports persistent sore throat with mild swelling onset last week , airway intact /no oral swelling , denies fever or chills . Respirations unlabored.Janice White

## 2020-08-30 NOTE — ED Provider Notes (Signed)
[facility] EMERGENCY DEPARTMENT Provider Note   CSN: [id number] Arrival date & time: 08/30/20  2108     History Chief Complaint  Patient presents with   Sore Throat    Janice White is a 30 y.o. female with a history of asthma.  Patient presents to the emergency department with a complaint of sore throat.  Patient reports that she has had sore throat over the last week.  Shortness of breath has been constant over this time.  No aggravating factors.  Patient has had no relief with drinking warm fluids.  Patient also endorses nasal congestion and rhinorrhea.  Patient denies any drooling, trouble swallowing, high potato voice, neck pain, neck stiffness, fevers, chills, cough, chest pain, shortness of breath.  Per chart review patient has been seen at urgent care twice over the last week.  Patient was tested for COVID-19 which was negative.  Patient had rapid strep test and strep culture.  Rapid strep test negative.  Strep culture pending at this time.   Sore Throat Pertinent negatives include no chest pain and no shortness of breath.      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   Postpartum care following vaginal delivery 01/28/2018   Normal labor and delivery 01/26/2018   Irregular menstrual cycle 06/09/2016    Past Surgical History:  Procedure Laterality Date   WISDOM TOOTH EXTRACTION       OB History     Gravida  1   Para  1   Term  1   Preterm  0   AB  0   Living  1      SAB  0   IAB  0   Ectopic  0   Multiple  0   Live Births  1           Family History  Problem Relation Age of Onset   Diabetes Mother    Hypertension Mother    Multiple sclerosis Mother    Heart disease Father    Stroke Father    Diabetes Maternal Grandmother     Social History   Tobacco Use   Smoking status:  Never   Smokeless tobacco: Never  Vaping Use   Vaping Use: Never used  Substance Use Topics   Alcohol use: No   Drug use: No    Home Medications Prior to Admission medications   Medication Sig Start Date End Date Taking? Authorizing Provider  lidocaine (XYLOCAINE) 2 % solution Use as directed 15 mLs in the mouth or throat every 6 (six) hours as needed for mouth pain. 08/30/20  Yes Haskel Schroeder, PA-C  cetirizine (ZYRTEC ALLERGY) 10 MG tablet Take 1 tablet (10 mg total) by mouth daily. 08/29/20   Wallis Bamberg, PA-C  fluticasone (FLONASE) 50 MCG/ACT nasal spray Place 1 spray into both nostrils in the morning and at bedtime. 08/29/20   Wallis Bamberg, PA-C  nitrofurantoin, macrocrystal-monohydrate, (MACROBID) 100 MG capsule Take 1 capsule (100 mg total) by mouth 2 (two) times daily. 08/26/20   Lamptey, Britta Mccreedy, MD  pseudoephedrine (SUDAFED) 60 MG tablet Take 1 tablet (60 mg total) by mouth every 8 (eight) hours as needed for congestion. 08/29/20   Wallis Bamberg, PA-C    Allergies    Penicillins  Review of Systems   Review of Systems  Constitutional:  Negative for chills and fever.  HENT:  Positive for congestion, rhinorrhea and sore throat. Negative for drooling, facial swelling, trouble swallowing and voice change.   Respiratory:  Negative for shortness of breath.   Cardiovascular:  Negative for chest pain.  Musculoskeletal:  Negative for neck pain and neck stiffness.   Physical Exam Updated Vital Signs BP 124/83 (BP Location: Right Arm)   Pulse 96   Temp 98.4 F (36.9 C) (Oral)   Resp 18   LMP 08/26/2020 (Exact Date)   SpO2 97%   Physical Exam Vitals and nursing note reviewed.  Constitutional:      General: She is not in acute distress.    Appearance: She is not ill-appearing, toxic-appearing or diaphoretic.  HENT:     Head: Normocephalic. No raccoon eyes, abrasion, contusion, masses, right periorbital erythema, left periorbital erythema or laceration.     Jaw: No trismus,  tenderness or pain on movement.     Mouth/Throat:     Mouth: Mucous membranes are moist. No injury, lacerations, oral lesions or angioedema.     Pharynx: Oropharynx is clear. Uvula midline. Posterior oropharyngeal erythema present. No pharyngeal swelling, oropharyngeal exudate or uvula swelling.     Tonsils: No tonsillar exudate or tonsillar abscesses. 2+ on the right. 2+ on the left.     Comments: Cobblestoning noted to posterior oropharynx.  No exudate, swelling to oral mucosa, dental abscess, peritonsillar abscess.  Patient able to handle oral secretions without difficulty. Eyes:     General: No scleral icterus.       Right eye: No discharge.        Left eye: No discharge.  Cardiovascular:     Rate and Rhythm: Normal rate.  Pulmonary:     Effort: Pulmonary effort is normal. No tachypnea, bradypnea or respiratory distress.     Comments: Patient speaks in full complete sentences without difficulty Musculoskeletal:     Cervical back: Full passive range of motion without pain, normal range of motion and neck supple.  Skin:    General: Skin is warm and dry.  Neurological:     General: No focal deficit present.     Mental Status: She is alert.  Psychiatric:        Behavior: Behavior is cooperative.    ED Results / Procedures / Treatments   Labs (all labs ordered are listed, but only abnormal results are displayed) Labs Reviewed - No data to display  EKG None  Radiology No results found.  Procedures Procedures   Medications Ordered in ED Medications - No data to display  ED Course  I have reviewed the triage vital signs and the nursing notes.  Pertinent labs & imaging results that were available during my care of the patient were reviewed by me and considered in my medical decision making (see chart for details).    MDM Rules/Calculators/A&P                          Alert 30 year old female in no acute distress, nontoxic-appearing.  Patient presents emerged department  with chief complaint of sore throat.  Sore throat has been present over the last week.  Per chart review patient has been seen at urgent care twice over this time period..  Patient had negative COVID-19 test, negative rapid strep test.  Strep culture pending at this time.  Patient denies any trismus, trouble swallowing, high potato voice, drooling.  Patient able to handle oral secretions without  difficulty.  Patient speaks in full complete sentences without difficulty.  Full passive range of motion to neck without pain.  Cobblestoning and erythema to posterior oropharynx.  No swelling, exudate, peritonsillar abscess, dental abscess, swelling to oral mucosa.  No facial swelling or swelling to neck.  Low suspicion for peritonsillar abscess, [place]ig angina, or other deep space neck infection.  Will prescribe patient with viscous lidocaine.  Patient given strict return precautions.  Patient expressed understanding of all instructions and is agreeable with this plan.  Final Clinical Impression(s) / ED Diagnoses Final diagnoses:  Sore throat  Pharyngitis, unspecified etiology    Rx / DC Orders ED Discharge Orders          Ordered    lidocaine (XYLOCAINE) 2 % solution  Every 6 hours PRN        08/30/20 2132             Haskel Schroeder, PA-C 08/30/20 2336    Charlynne Pander, MD 08/31/20 [phone]

## 2020-08-30 NOTE — Discharge Instructions (Addendum)
You came to the emergency department today to be evaluated for your sore throat.  Your physical exam was reassuring.  Your strep test obtained yesterday at the urgent care was negative and the culture is still pending.  I have given you prescription for viscous lidocaine.  You may use this every 6 hours as needed to help with your sore throat.  Get help right away if: You have difficulty breathing. You cannot swallow fluids, soft foods, or your saliva. You have increased swelling in your throat or neck. You have persistent nausea and vomiting.

## 2020-09-01 LAB — CULTURE, GROUP A STREP (THRC)

## 2020-10-02 ENCOUNTER — Encounter: Payer: Self-pay | Admitting: Internal Medicine

## 2020-10-02 ENCOUNTER — Other Ambulatory Visit: Payer: Self-pay

## 2020-10-02 ENCOUNTER — Ambulatory Visit (INDEPENDENT_AMBULATORY_CARE_PROVIDER_SITE_OTHER): Payer: No Typology Code available for payment source | Admitting: Internal Medicine

## 2020-10-02 VITALS — BP 124/84 | HR 109 | Temp 98.0°F | Resp 16 | Ht 59.0 in | Wt 156.0 lb

## 2020-10-02 DIAGNOSIS — J4520 Mild intermittent asthma, uncomplicated: Secondary | ICD-10-CM | POA: Diagnosis not present

## 2020-10-02 DIAGNOSIS — Z880 Allergy status to penicillin: Secondary | ICD-10-CM | POA: Insufficient documentation

## 2020-10-02 DIAGNOSIS — J029 Acute pharyngitis, unspecified: Secondary | ICD-10-CM

## 2020-10-02 HISTORY — DX: Allergy status to penicillin: Z88.0

## 2020-10-02 MED ORDER — ALBUTEROL SULFATE HFA 108 (90 BASE) MCG/ACT IN AERS: 2.0000 | INHALATION_SPRAY | RESPIRATORY_TRACT | 2 refills | Status: DC | PRN

## 2020-10-02 MED ORDER — CETIRIZINE HCL 10 MG PO TABS: 10.0000 mg | ORAL_TABLET | Freq: Every day | ORAL | 5 refills | Status: DC | PRN

## 2020-10-02 NOTE — Patient Instructions (Addendum)
History of Prolonged Sore Throat-likely viral: - allergy testing today was negative - If you feel the Zyrtec and flonase were helpful to you, can continue.  Otherwise, okay to discontinue.   Intermittent Asthma-controlled: - Use Albuterol (Proair/Ventolin) 2 puffs every 4-6 hours as needed for chest tightness, wheezing, or coughing - Use Albuterol (Proair/Ventolin) 2 puffs 15 minutes prior to exercise if you have symptoms with activity - Use a spacer with all inhalers - please keep track of how often you are needing rescue inhaler Albuterol (Proair/Ventolin) as this will help guide future management - Asthma is not controlled if:  - Symptoms are occurring >2 times a week OR  - >2 times a month nighttime awakenings  - Please call the clinic to schedule a follow up if these symptoms arise  H/O Penicillin allergy: - please schedule follow-up appt at your convenience for penicillin testing followed by graded oral challenge if indicated - please refrain from taking any antihistamines at least 5 days prior to this appointment (okay to continue using nasal sprays like Flonase (fluticasone), Astelin (azelastine) or Atrovent (ipratropium) or medications like Singulair (montelukast) if you are already taking them) - around 80% of individuals outgrow this allergy in ~ 10 years and carrying it as a diagnosis can prevent you from getting proper therapy if needed  It was a pleasure seeing you in clinic today! If you receive a survey about today's experience, please consider giving [place] feedback on how we're doing!  Tonny Bollman, MD [facility]

## 2020-10-02 NOTE — Progress Notes (Signed)
NEW PATIENT Date of Service/Encounter:  10/02/20  Subjective:   Chief Complaint  Patient presents with   Sore Throat   Allergies   Janice White is a 30 y.o. female with a PMHx of penicillin allergy, irregular menstrual cycle, and asthma presenting today for evaluation of allergies and sore throat. History obtained from: chart review and patient  She presents today for history of sore throat which lasted for several weeks with difficulty breathing.  She was worked up for several infectious etiologies including COVID, flu, and strep which were all negative.  She was told this symptom was secondary to allergies.  She was given zyrtec and flonase which she uses daily as needed.  Denies watery/itchy eyes, nasal congestion/rhinorrhea, or itchy nose.  She does get strep throat usually every Fall.  Asthma: Diagnosed in early childhood. Current symptoms include shortness of breath 0 daytime symptoms in past month, 1 nighttime awakenings in past month, using rescue inhaler very infrequently, maybe once in past year, Limitations to daily activity: none 0 ED visits, 0 UC visits and 0 oral steroids in the past year 0 number of lifetime hospitalizations, 0 number of lifetime intubations.  Identified Triggers: exercise Previously used therapies: rescue inhaler only.  Current regimen:  Controller Albuterol 2 puffs q4-6 hrs PRN  Up-to-date with Covid-19 Flu vaccines. History of prior pneumonias: 0 History of prior COVID-19 infection: 0 Smoking exposure: none, not a smoker Today's Asthma Control Test: ACT Total Score: 23.    She does not have concern for food allergy.  She eats eggs, peanuts, tree nuts, shellfish, finned fish, wheat, soy without symptoms.  Other allergy screening: Medication allergy: yes Penicillin allergy-made her itchy, and discontinued. Last use in 2012.   Hymenoptera allergy: no Urticaria: no Eczema:no History of recurrent infections suggestive of immunodeficency:  no Vaccinations are up to date.   Past Medical History: Past Medical History:  Diagnosis Date   Asthma    PONV (postoperative nausea and vomiting)    Medication List:  Current Outpatient Medications  Medication Sig Dispense Refill   cetirizine (ZYRTEC ALLERGY) 10 MG tablet Take 1 tablet (10 mg total) by mouth daily. 30 tablet 0   fluticasone (FLONASE) 50 MCG/ACT nasal spray Place 1 spray into both nostrils in the morning and at bedtime. 16 g 2   pseudoephedrine (SUDAFED) 60 MG tablet Take 1 tablet (60 mg total) by mouth every 8 (eight) hours as needed for congestion. 30 tablet 0   No current facility-administered medications for this visit.   Known Allergies:  Allergies  Allergen Reactions   Penicillins Itching    Has patient had a PCN reaction causing immediate rash, facial/tongue/throat swelling, SOB or lightheadedness with hypotension: No Has patient had a PCN reaction causing severe rash involving mucus membranes or skin necrosis: No Has patient had a PCN reaction that required hospitalization No Has patient had a PCN reaction occurring within the last 10 years: Yes If all of the above answers are "NO", then may proceed with Cephalosporin use.    Past Surgical History: Past Surgical History:  Procedure Laterality Date   WISDOM TOOTH EXTRACTION     Family History: Family History  Problem Relation Age of Onset   Diabetes Mother    Hypertension Mother    Multiple sclerosis Mother    Heart disease Father    Stroke Father    Diabetes Maternal Grandmother    Social History: Janice White lives in an apartment, with wood floors, no pets, central AC, dust mite  covers on mattress and pillows, works as Building surveyor in Production assistant, radio, never a smoker.   ROS:  All other systems negative except as noted per HPI.  Objective:  Blood pressure 124/84, pulse (!) 109, temperature 98 F (36.7 C), temperature source Temporal, resp. rate 16, height 4\' 11"  (1.499 m), weight 156 lb (70.8  kg), SpO2 99 %, currently breastfeeding. Body mass index is 31.51 kg/m. Physical Exam:  General Appearance:  Alert, cooperative, no distress, appears stated age  Head:  Normocephalic, without obvious abnormality, atraumatic  Eyes:  Conjunctiva clear, EOM's intact  Nose: Nares normal, hypertrophic bilateral turbinates, pink boggy mucosa  Ears Normal EACs bilaterally, TM's normal bilaterally  Throat: Lips, tongue normal; teeth and gums normal, normal posterior oropharynx  Neck: Supple, symmetrical  Lungs:   CTAB, no wheezing/rales/crackles, Respirations unlabored, no coughing  Heart:  RRR, no murmurs, Appears well perfused  Extremities: No edema  Skin: Skin color, texture, turgor normal, no rashes or lesions on visualized portions of skin  Neurologic: No gross deficits   Diagnostics: Skin Testing: Environmental allergy panel. Positive test to: none. Negative test to: all.  Results discussed with patient/family.  Airborne Adult Perc - 10/02/20 0935     Time Antigen Placed 0935    Allergen Manufacturer 10/04/20    Location Back    Number of Test 59    1. Control-Buffer 50% Glycerol Negative    2. Control-Histamine 1 mg/ml 2+    3. Albumin saline Negative    4. Bahia Negative    5. Waynette Buttery Negative    6. Johnson Negative    7. Kentucky Blue Negative    8. Meadow Fescue Negative    9. Perennial Rye Negative    10. Sweet Vernal Negative    11. Timothy Negative    12. Cocklebur Negative    13. Burweed Marshelder Negative    14. Ragweed, short Negative    15. Ragweed, Giant Negative    16. Plantain,  English Negative    17. Lamb's Quarters Negative    18. Sheep Sorrell Negative    19. Rough Pigweed Negative    20. Marsh Elder, Rough Negative    21. Mugwort, Common Negative    22. Ash mix Negative    23. Birch mix Negative    24. Beech American Negative    25. Box, Elder Negative    26. Cedar, red Negative    27. Cottonwood, [place] Negative    28. Elm mix Negative    29.  Hickory Negative    30. Maple mix Negative    31. Oak, Guinea[place] mix Negative    32. Pecan Pollen Negative    33. Pine mix Negative    34. Sycamore Eastern Negative    35. Walnut, Black Pollen Negative    36. Alternaria alternata Negative    37. Cladosporium Herbarum Negative    38. Aspergillus mix Negative    39. Penicillium mix Negative    40. Bipolaris sorokiniana (Helminthosporium) Negative    41. Drechslera spicifera (Curvularia) Negative    42. Mucor plumbeus Negative    43. Fusarium moniliforme Negative    44. Aureobasidium pullulans (pullulara) Negative    45. Rhizopus oryzae Negative    46. Botrytis cinera Negative    47. Epicoccum nigrum Negative    48. Phoma betae Negative    49. Candida Albicans Negative    50. Trichophyton mentagrophytes Negative    51. Mite, D Farinae  5,000 AU/ml Negative    52.  Mite, D Pteronyssinus  5,000 AU/ml Negative    53. Cat Hair 10,000 BAU/ml Negative    54.  Dog Epithelia Negative    55. Mixed Feathers Negative    56. Horse Epithelia Negative    57. Cockroach, German Negative    58. Mouse Negative    59. Tobacco Leaf Negative             Allergy testing results were read and interpreted by myself, documented by clinical staff.  Assessment:  Sore throat  Mild intermittent asthma without complication  Penicillin allergy Plan/Recommendations:   Patient Instructions  History of Prolonged Sore Throat-likely viral: - allergy testing today was negative - If you feel the zyrtec and flonase were helpful to you, can continue.  Otherwise, okay to discontinue.    Intermittent Asthma-controlled: - Use Albuterol (Proair/Ventolin) 2 puffs every 4-6 hours as needed for chest tightness, wheezing, or coughing - Use Albuterol (Proair/Ventolin) 2 puffs 15 minutes prior to exercise if you have symptoms with activity - Use a spacer with all inhalers - please keep track of how often you are needing rescue inhaler Albuterol (Proair/Ventolin) as  this will help guide future management - Asthma is not controlled if:  - Symptoms are occurring >2 times a week OR  - >2 times a month nighttime awakenings  - Please call the clinic to schedule a follow up if these symptoms arise  H/O Penicillin allergy: - please schedule follow-up appt at your convenience for penicillin testing followed by graded oral challenge if indicated - please refrain from taking any antihistamines at least 5 days prior to this appointment (okay to continue using nasal sprays like Flonase (fluticasone), Astelin (azelastine) or Atrovent (ipratropium) or medications like Singulair (montelukast) if you are already taking them) - around 80% of individuals outgrow this allergy in ~ 10 years and carrying it as a diagnosis can prevent you from getting proper therapy if needed  It was a pleasure seeing you in clinic today! If you receive a survey about today's experience, please consider giving [place] feedback on how we're doing!  Tonny Bollman, MD [facility]

## 2020-11-07 ENCOUNTER — Ambulatory Visit (HOSPITAL_COMMUNITY)
Admission: EM | Admit: 2020-11-07 | Discharge: 2020-11-07 | Disposition: A | Discharge status: Home / Self Care | Payer: No Typology Code available for payment source | Attending: Physician Assistant | Admitting: Physician Assistant

## 2020-11-07 ENCOUNTER — Other Ambulatory Visit: Payer: Self-pay

## 2020-11-07 ENCOUNTER — Encounter (HOSPITAL_COMMUNITY): Payer: Self-pay

## 2020-11-07 DIAGNOSIS — N898 Other specified noninflammatory disorders of vagina: Secondary | ICD-10-CM | POA: Diagnosis not present

## 2020-11-07 LAB — POCT URINALYSIS DIPSTICK, ED / UC
Bilirubin Urine: NEGATIVE
Glucose, UA: NEGATIVE mg/dL
Hgb urine dipstick: NEGATIVE
Ketones, ur: NEGATIVE mg/dL
Leukocytes,Ua: NEGATIVE
Nitrite: NEGATIVE
Protein, ur: NEGATIVE mg/dL
Specific Gravity, Urine: 1.02 (ref 1.005–1.030)
Urobilinogen, UA: 0.2 mg/dL (ref 0.0–1.0)
pH: 7.5 (ref 5.0–8.0)

## 2020-11-07 LAB — POC URINE PREG, ED: Preg Test, Ur: NEGATIVE

## 2020-11-07 NOTE — Discharge Instructions (Addendum)
Will notify of results once available. Follow up with any further concerns.

## 2020-11-07 NOTE — ED Triage Notes (Signed)
Pt reports white creamy vaginal discharge and lower abdominal cramps x 3-4 days.

## 2020-11-07 NOTE — ED Provider Notes (Signed)
[facility]    CSN: [id number] Arrival date & time: 11/07/20  1224      History   Chief Complaint Chief Complaint  Patient presents with   Vaginal Discharge   Abdominal Cramping    HPI Janice White is a 30 y.o. female.   Patient here today for evaluation of vaginal discharge and lower abdominal cramping this been ongoing for the last few days.  Her last menstrual period was 2 weeks ago.  She denies any vaginal itching.  She states discharge is somewhat different than when she had yeast infections in the past.  She has not any fever or chills.  She denies any nausea or vomiting.  She does not report any treatment for symptoms.    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 Authorizing Provider  ibuprofen (ADVIL) 200 MG tablet Take 200 mg by mouth every 6 (six) hours as needed.   Yes [provider]  albuterol (VENTOLIN HFA) 108 (90 Base) MCG/ACT inhaler Inhale 2 puffs into the lungs every 4 (four) hours as needed for wheezing or shortness of breath (or 15 minutes prior to exercise). 10/02/20   Tonny Bollman, MD  cetirizine (ZYRTEC ALLERGY) 10 MG tablet Take 1 tablet (10 mg total) by mouth daily as needed for allergies. 10/02/20   Tonny Bollman, MD  fluticasone [facility]) 50 MCG/ACT nasal spray Place 1 spray into both nostrils in the morning and at bedtime. 08/29/20   Wallis Bamberg, PA-C  pseudoephedrine (SUDAFED) 60 MG tablet Take 1 tablet (60 mg total) by mouth every 8 (eight) hours as needed for congestion. 08/29/20   Wallis Bamberg, PA-C    Family History Family History  Problem Relation Age of Onset   Diabetes Mother    Hypertension Mother    Multiple sclerosis Mother    Heart disease Father    Stroke Father    Diabetes Maternal Grandmother     Social History Social History   Tobacco Use   Smoking status: Never   Smokeless tobacco: Never  Vaping Use   Vaping Use: Never used  Substance Use Topics   Alcohol use: No   Drug use: No     Allergies   Penicillins   Review of Systems Review of Systems  Constitutional:  Negative for chills and fever.  Respiratory:  Negative for shortness of breath.   Cardiovascular:  Negative for chest pain.  Gastrointestinal:  Negative for abdominal pain, nausea and vomiting.  Genitourinary:  Positive for  vaginal discharge.  Musculoskeletal:  Negative for back pain.    Physical Exam Triage Vital Signs ED Triage Vitals  Enc Vitals Group     BP 11/07/20 1407 121/82     Pulse Rate 11/07/20 1407 66     Resp 11/07/20 1407 16     Temp 11/07/20 1407 98.9 F (37.2 C)     Temp Source 11/07/20 1407 Oral     SpO2 11/07/20 1407 98 %     Weight --      Height --      Head Circumference --      Peak Flow --      Pain Score 11/07/20 1403 5     Pain Loc --      Pain Edu? --      Excl. in GC? --    No data found.  Updated Vital Signs BP 121/82 (BP Location: Left Arm)   Pulse 66   Temp 98.9 F (37.2 C) (Oral)   Resp 16   SpO2 98%      Physical Exam Vitals reviewed.  Constitutional:      General: She is not in acute distress.    Appearance: Normal appearance. She is not ill-appearing.  HENT:     Head: Normocephalic and atraumatic.  Eyes:     Conjunctiva/sclera:  Conjunctivae normal.  Cardiovascular:     Rate and Rhythm: Normal rate.  Pulmonary:     Effort: Pulmonary effort is normal.  Abdominal:     General: Abdomen is flat. There is no distension.     Palpations: Abdomen is soft.     Tenderness: There is no abdominal tenderness. There is no guarding or rebound.  Skin:    General: Skin is warm and dry.  Neurological:     Mental Status: She is alert.  Psychiatric:        Mood and Affect: Mood normal.        Behavior: Behavior normal.        Thought Content: Thought content normal.     UC Treatments / Results  Labs (all labs ordered are listed, but only abnormal results are displayed) Labs Reviewed  POCT URINALYSIS DIPSTICK, ED / UC  POC URINE PREG, ED  CERVICOVAGINAL ANCILLARY ONLY    EKG   Radiology No results found.  Procedures Procedures (including critical care time)  Medications Ordered in UC Medications - No data to display  Initial Impression / Assessment and Plan / UC Course  I have reviewed the triage vital signs and the nursing notes.  Pertinent labs & imaging results that were available during my care of the patient were reviewed by me and considered in my medical decision making (see chart for details).  UA without concerning findings, will order urine culture.  Urine pregnancy test negative.  STD screening ordered, and will notify of results once available.  Encouraged follow-up with any further concerns.  Final Clinical Impressions(s) / UC Diagnoses   Final diagnoses:  Vaginal discharge     Discharge Instructions      Will notify of results once available. Follow up with any further concerns.      ED Prescriptions   None    PDMP not reviewed this encounter.   Tomi Bamberger, PA-C 11/07/20 1458

## 2020-11-08 ENCOUNTER — Ambulatory Visit: Payer: No Typology Code available for payment source | Admitting: Family

## 2020-11-08 ENCOUNTER — Telehealth (HOSPITAL_COMMUNITY): Payer: Self-pay | Admitting: Emergency Medicine

## 2020-11-08 DIAGNOSIS — J309 Allergic rhinitis, unspecified: Secondary | ICD-10-CM

## 2020-11-08 LAB — CERVICOVAGINAL ANCILLARY ONLY
Bacterial Vaginitis (gardnerella): POSITIVE — AB
Candida Glabrata: NEGATIVE
Candida Vaginitis: NEGATIVE
Chlamydia: NEGATIVE
Comment: NEGATIVE
Comment: NEGATIVE
Comment: NEGATIVE
Comment: NEGATIVE
Comment: NEGATIVE
Comment: NORMAL
Neisseria Gonorrhea: NEGATIVE
Trichomonas: NEGATIVE

## 2020-11-08 LAB — URINE CULTURE: Culture: >=100000 — AB

## 2020-11-08 MED ORDER — METRONIDAZOLE 0.75 % VA GEL: 1.0000 | Freq: Every day | VAGINAL | 0 refills | Status: AC

## 2020-12-20 ENCOUNTER — Encounter (HOSPITAL_COMMUNITY): Payer: Self-pay

## 2020-12-20 ENCOUNTER — Other Ambulatory Visit: Payer: Self-pay

## 2020-12-20 ENCOUNTER — Ambulatory Visit (HOSPITAL_COMMUNITY)
Admission: EM | Admit: 2020-12-20 | Discharge: 2020-12-20 | Disposition: A | Discharge status: Home / Self Care | Payer: No Typology Code available for payment source | Attending: Physician Assistant | Admitting: Physician Assistant

## 2020-12-20 DIAGNOSIS — Z113 Encounter for screening for infections with a predominantly sexual mode of transmission: Secondary | ICD-10-CM | POA: Diagnosis present

## 2020-12-20 DIAGNOSIS — N898 Other specified noninflammatory disorders of vagina: Secondary | ICD-10-CM | POA: Diagnosis present

## 2020-12-20 LAB — POCT URINALYSIS DIPSTICK, ED / UC
Bilirubin Urine: NEGATIVE
Glucose, UA: NEGATIVE mg/dL
Hgb urine dipstick: NEGATIVE
Ketones, ur: 15 mg/dL — AB
Leukocytes,Ua: NEGATIVE
Nitrite: NEGATIVE
Protein, ur: 30 mg/dL — AB
Specific Gravity, Urine: 1.025 (ref 1.005–1.030)
Urobilinogen, UA: 1 mg/dL (ref 0.0–1.0)
pH: 7 (ref 5.0–8.0)

## 2020-12-20 LAB — POC URINE PREG, ED: Preg Test, Ur: NEGATIVE

## 2020-12-20 LAB — HIV ANTIBODY (ROUTINE TESTING W REFLEX): HIV Screen 4th Generation wRfx: NONREACTIVE

## 2020-12-20 LAB — HEPATITIS C ANTIBODY: HCV Ab: NONREACTIVE

## 2020-12-20 NOTE — Discharge Instructions (Signed)
We will contact you if any of your results are positive we will need to arrange treatment.  Please monitor your MyChart as well for results.  Wear loosefitting cotton underwear and use hypoallergenic soaps and detergents.  If you have any worsening symptoms please return for reevaluation.

## 2020-12-20 NOTE — ED Provider Notes (Signed)
[facility]    CSN: [id number] Arrival date & time: 12/20/20  1735      History   Chief Complaint Chief Complaint  Patient presents with   Abdominal Pain    HPI Janice White is a 30 y.o. female.   Patient presents today with several day history of vaginal discharge.  She describes this as white mucus without odor.  She denies any new sexual partners.  She is sexually active with men.  Denies any recent antibiotic use.  Denies any changes to personal hygiene products including soaps or detergents.  She is interested in STI testing including blood testing for HIV/hepatitis/syphilis.  She is not currently pregnant.  She denies any abdominal pain, pelvic pain, abnormal vaginal bleeding.  Denies any nausea, vomiting, fever.  She is not breast-feeding; weaned her son approximately 1 month ago.   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 Authorizing Provider  albuterol (VENTOLIN HFA) 108 (90 Base) MCG/ACT inhaler Inhale 2 puffs into the lungs every 4 (four) hours as needed for wheezing or shortness of breath (or 15 minutes prior to exercise). 10/02/20   Tonny Bollman, MD  cetirizine (ZYRTEC ALLERGY) 10 MG tablet Take 1 tablet (10 mg total) by mouth  daily as needed for allergies. 10/02/20   Tonny Bollman, MD  fluticasone [facility]) 50 MCG/ACT nasal spray Place 1 spray into both nostrils in the morning and at bedtime. 08/29/20   Wallis Bamberg, PA-C  ibuprofen (ADVIL) 200 MG tablet Take 200 mg by mouth every 6 (six) hours as needed.    [provider]  pseudoephedrine (SUDAFED) 60 MG tablet Take 1 tablet (60 mg total) by mouth every 8 (eight) hours as needed for congestion. 08/29/20   Wallis Bamberg, PA-C    Family History Family History  Problem Relation Age of Onset   Diabetes Mother    Hypertension Mother    Multiple sclerosis Mother    Heart disease Father    Stroke Father    Diabetes Maternal Grandmother     Social History Social History   Tobacco Use   Smoking status: Never   Smokeless tobacco: Never  Vaping Use   Vaping Use: Never used  Substance Use Topics   Alcohol use: No   Drug use: No     Allergies   Penicillins   Review of Systems Review of Systems  Constitutional:  Negative for activity change, appetite change, fatigue and fever.  Respiratory:  Negative for cough and shortness of  breath.   Cardiovascular:  Negative for chest pain.  Gastrointestinal:  Negative for abdominal pain, diarrhea, nausea and vomiting.  Genitourinary:  Positive for vaginal discharge. Negative for dysuria, frequency, pelvic pain, urgency, vaginal bleeding and vaginal pain.  Musculoskeletal:  Negative for arthralgias and myalgias.    Physical Exam Triage Vital Signs ED Triage Vitals [12/20/20 1843]  Enc Vitals Group     BP 113/80     Pulse Rate 70     Resp 17     Temp 99.3 F (37.4 C)     Temp Source Oral     SpO2 100 %     Weight      Height      Head Circumference      Peak Flow      Pain Score      Pain Loc      Pain Edu?      Excl. in GC?    No data found.  Updated Vital Signs BP 113/80 (BP Location: Left Arm)   Pulse 70   Temp 99.3 F (37.4 C) (Oral)   Resp 17   SpO2 100%   Visual Acuity Right Eye  Distance:   Left Eye Distance:   Bilateral Distance:    Right Eye Near:   Left Eye Near:    Bilateral Near:     Physical Exam Vitals reviewed.  Constitutional:      General: She is awake. She is not in acute distress.    Appearance: Normal appearance. She is well-developed. She is not ill-appearing.     Comments: Very pleasant female appears stated age in no acute distress sitting comfortably in exam room  HENT:     Head: Normocephalic and atraumatic.  Cardiovascular:     Rate and Rhythm: Normal rate and regular rhythm.     Heart sounds: Normal heart sounds, S1 normal and S2 normal. No murmur heard. Pulmonary:     Effort: Pulmonary effort is normal.     Breath sounds: Normal breath sounds. No wheezing, rhonchi or rales.     Comments: Clear auscultation bilaterally Abdominal:     General: Bowel sounds are normal.     Palpations: Abdomen is soft.     Tenderness: There is no abdominal tenderness. There is no right CVA tenderness, left CVA tenderness, guarding or rebound.     Comments: Benign abdominal exam  Genitourinary:    Comments: Exam deferred Psychiatric:        Behavior: Behavior is cooperative.     UC Treatments / Results  Labs (all labs ordered are listed, but only abnormal results are displayed) Labs Reviewed  POCT URINALYSIS DIPSTICK, ED / UC - Abnormal; Notable for the following components:      Result Value   Ketones, ur 15 (*)    Protein, ur 30 (*)    All other components within normal limits  HIV ANTIBODY (ROUTINE TESTING W REFLEX)  RPR  HEPATITIS C ANTIBODY  POC URINE PREG, ED  CERVICOVAGINAL ANCILLARY ONLY    EKG   Radiology No results found.  Procedures Procedures (including critical care time)  Medications Ordered in UC Medications - No data to display  Initial Impression / Assessment and Plan / UC Course  I have reviewed the triage vital signs and the nursing notes.  Pertinent labs & imaging results that were available during my care  of the patient were reviewed by me and considered in my medical decision making (see chart for details).  Vital signs and physical exam are reassuring today; no indication for emergent evaluation or imaging.  STI swab as well as HIV/hepatitis C/RPR collected today-results pending.  Will defer antibiotic treatment until results are obtained.  Urine pregnancy test was negative.  Patient reports she is not breast-feeding.  UA showed no significant signs of infection.  She was instructed to wear loosefitting cotton underwear and use hypoallergenic soaps and detergents.  Discussed the importance of safe sex practices.  Discussed alarm symptoms that warrant emergent evaluation.  Strict return precautions given to which she expressed understanding.  Final Clinical Impressions(s) / UC Diagnoses   Final diagnoses:  Vaginal discharge  Routine screening for STI (sexually transmitted infection)     Discharge Instructions      We will contact you if any of your results are positive we will need to arrange treatment.  Please monitor your MyChart as well for results.  Wear loosefitting cotton underwear and use hypoallergenic soaps and detergents.  If you have any worsening symptoms please return for reevaluation.     ED Prescriptions   None    PDMP not reviewed this encounter.   Jeani Hawking, PA-C 12/20/20 1910

## 2020-12-20 NOTE — ED Triage Notes (Signed)
Pt reports abdominal cramps and white vaginal discharge x 2 days.   Pt requested STD's test.

## 2020-12-21 LAB — RPR: RPR Ser Ql: NONREACTIVE

## 2020-12-23 LAB — CERVICOVAGINAL ANCILLARY ONLY
Bacterial Vaginitis (gardnerella): POSITIVE — AB
Candida Glabrata: NEGATIVE
Candida Vaginitis: NEGATIVE
Chlamydia: NEGATIVE
Comment: NEGATIVE
Comment: NEGATIVE
Comment: NEGATIVE
Comment: NEGATIVE
Comment: NEGATIVE
Comment: NORMAL
Neisseria Gonorrhea: NEGATIVE
Trichomonas: NEGATIVE

## 2020-12-24 ENCOUNTER — Telehealth (HOSPITAL_COMMUNITY): Payer: Self-pay | Admitting: Emergency Medicine

## 2020-12-24 MED ORDER — METRONIDAZOLE 0.75 % VA GEL: 1.0000 | Freq: Every day | VAGINAL | 0 refills | Status: AC

## 2020-12-30 ENCOUNTER — Encounter (HOSPITAL_COMMUNITY): Payer: Self-pay | Admitting: Emergency Medicine

## 2020-12-30 ENCOUNTER — Other Ambulatory Visit: Payer: Self-pay

## 2020-12-30 ENCOUNTER — Emergency Department (HOSPITAL_COMMUNITY)
Admission: EM | Admit: 2020-12-30 | Discharge: 2020-12-31 | Disposition: A | Discharge status: Home / Self Care | Payer: No Typology Code available for payment source | Attending: Student | Admitting: Student

## 2020-12-30 DIAGNOSIS — Z5321 Procedure and treatment not carried out due to patient leaving prior to being seen by health care provider: Secondary | ICD-10-CM | POA: Insufficient documentation

## 2020-12-30 DIAGNOSIS — R350 Frequency of micturition: Secondary | ICD-10-CM | POA: Insufficient documentation

## 2020-12-30 DIAGNOSIS — R8290 Unspecified abnormal findings in urine: Secondary | ICD-10-CM | POA: Diagnosis not present

## 2020-12-30 DIAGNOSIS — N898 Other specified noninflammatory disorders of vagina: Secondary | ICD-10-CM | POA: Insufficient documentation

## 2020-12-30 NOTE — ED Provider Notes (Signed)
Emergency Medicine Provider Triage Evaluation Note  Janice White , a 30 y.o. female  was evaluated in triage.  Pt complains of vaginal discharge x 2 days. White/creamy. Utilizing OTC monostat without much change. Sexually active w/ 2 partners, 1 without protection, no known definitive exposure.   Review of Systems  Positive: Vaginal discharge.  Negative: Vaginal bleeding, pelvic pain, dysuria, fever,   Physical Exam  BP 115/75 (BP Location: Left Arm)   Pulse 83   Temp 98.4 F (36.9 C) (Oral)   Resp 14   LMP 11/27/2020   SpO2 100%  Gen:   Awake, no distress   Resp:  Normal effort  MSK:   Moves extremities without difficulty   Medical Decision Making  Medically screening exam initiated at 11:59 PM.  Appropriate orders placed.  Janice White was informed that the remainder of the evaluation will be completed by another provider, this initial triage assessment does not replace that evaluation, and the importance of remaining in the ED until their evaluation is complete.  Vaginal discharge.    Cherly Anderson, PA-C 12/31/20 0000    Palumbo, April, MD 12/31/20 0240

## 2020-12-30 NOTE — ED Triage Notes (Signed)
Patient here with vaginal discharge, white and creamy in nature. Has been using Monistat and wants to make sure she is using the right thing.

## 2020-12-31 ENCOUNTER — Ambulatory Visit (INDEPENDENT_AMBULATORY_CARE_PROVIDER_SITE_OTHER)
Admission: EM | Admit: 2020-12-31 | Discharge: 2020-12-31 | Disposition: A | Discharge status: Home / Self Care | Payer: No Typology Code available for payment source | Source: Home / Self Care

## 2020-12-31 ENCOUNTER — Other Ambulatory Visit: Payer: Self-pay

## 2020-12-31 DIAGNOSIS — N898 Other specified noninflammatory disorders of vagina: Secondary | ICD-10-CM | POA: Insufficient documentation

## 2020-12-31 DIAGNOSIS — R8290 Unspecified abnormal findings in urine: Secondary | ICD-10-CM | POA: Insufficient documentation

## 2020-12-31 DIAGNOSIS — R350 Frequency of micturition: Secondary | ICD-10-CM | POA: Insufficient documentation

## 2020-12-31 LAB — POCT URINALYSIS DIP (MANUAL ENTRY)
Bilirubin, UA: NEGATIVE
Blood, UA: NEGATIVE
Glucose, UA: NEGATIVE mg/dL
Ketones, POC UA: NEGATIVE mg/dL
Nitrite, UA: NEGATIVE
Protein Ur, POC: NEGATIVE mg/dL
Spec Grav, UA: 1.02 (ref 1.010–1.025)
Urobilinogen, UA: 0.2 E.U./dL
pH, UA: 7 (ref 5.0–8.0)

## 2020-12-31 LAB — POCT URINE PREGNANCY: Preg Test, Ur: NEGATIVE

## 2020-12-31 MED ORDER — FLUCONAZOLE 150 MG PO TABS: 150.0000 mg | ORAL_TABLET | ORAL | 0 refills | Status: DC | PRN

## 2020-12-31 NOTE — Discharge Instructions (Signed)
Your urine did have a little bit of white blood cells we will send this off for culture.  If we need to arrange any treatment we will contact you to start antibiotics.  We are going to treat you for yeast infection.  Please take Diflucan every 72 hours for total of 2 doses.  We will contact you based on your swab results if need to arrange additional treatment.  If you have any worsening symptoms please return for reevaluation.  Use hypoallergenic soaps and detergents and wear loosefitting cotton underwear.

## 2020-12-31 NOTE — ED Notes (Signed)
PT leaving at this time 

## 2020-12-31 NOTE — ED Provider Notes (Signed)
[facility]    CSN: [id number] Arrival date & time: 12/31/20  0949      History   Chief Complaint Chief Complaint  Patient presents with   sti check    HPI Janice White is a 30 y.o. female.   Patient presents today with a several day history of vaginal discharge.  Reports that discharge is described as thick, irritating, white.  She is concerned for yeast infection.  She has tried Monistat without improvement of symptoms.  She is sexually active with female partners and does not consistently use condoms.  Denies any known STI exposure.  Denies any recent antibiotic use.  Does report changing her detergent recently wonders if this could be contributing to symptoms.  She denies any pelvic pain, abdominal pain, nausea, vomiting, fever.  She does report some urinary frequency and is interested in having her urine checked.  Denies any dysuria, hematuria, flank pain.  Denies any recent urogenital procedure, self-catheterization, history of nephrolithiasis.   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 Authorizing Provider  fluconazole (DIFLUCAN) 150 MG tablet Take 1 tablet (150 mg total) by mouth every 3 (three) days as needed for up to 2 doses.  12/31/20  Yes Xianna Siverling K, PA-C  albuterol (VENTOLIN HFA) 108 (90 Base) MCG/ACT inhaler Inhale 2 puffs into the lungs every 4 (four) hours as needed for wheezing or shortness of breath (or 15 minutes prior to exercise). 10/02/20   Sigurd Sos, MD  cetirizine (ZYRTEC ALLERGY) 10 MG tablet Take 1 tablet (10 mg total) by mouth daily as needed for allergies. 10/02/20   Sigurd Sos, MD  fluticasone [facility]) 50 MCG/ACT nasal spray Place 1 spray into both nostrils in the morning and at bedtime. 08/29/20   Jaynee Eagles, PA-C    Family History Family History  Problem Relation Age of Onset   Diabetes Mother    Hypertension Mother    Multiple sclerosis Mother    Heart disease Father    Stroke Father    Diabetes Maternal Grandmother     Social History Social History   Tobacco Use   Smoking status: Never   Smokeless tobacco: Never  Vaping Use   Vaping Use: Never used  Substance Use Topics   Alcohol use: No   Drug use: No     Allergies   Penicillins   Review of Systems Review of Systems  Constitutional:  Negative for activity change, appetite change, fatigue and fever.  Respiratory:  Negative for  cough and shortness of breath.   Cardiovascular:  Negative for chest pain.  Gastrointestinal:  Negative for abdominal pain, diarrhea, nausea and vomiting.  Genitourinary:  Positive for frequency and vaginal discharge. Negative for dysuria, flank pain, genital sores, urgency, vaginal bleeding and vaginal pain.  Neurological:  Negative for dizziness, light-headedness and headaches.    Physical Exam Triage Vital Signs ED Triage Vitals  Enc Vitals Group     BP 12/31/20 1205 123/79     Pulse Rate 12/31/20 1205 72     Resp 12/31/20 1205 18     Temp 12/31/20 1205 98.5 F (36.9 C)     Temp Source 12/31/20 1205 Oral     SpO2 12/31/20 1205 99 %     Weight --      Height --      Head Circumference --      Peak Flow --      Pain Score 12/31/20 1206 0     Pain Loc --      Pain Edu? --       Excl. in [place]? --    No data found.  Updated Vital Signs BP 123/79 (BP Location: Left Arm)   Pulse 72   Temp 98.5 F (36.9 C) (Oral)   Resp 18   SpO2 99%   Breastfeeding No   Visual Acuity Right Eye Distance:   Left Eye Distance:   Bilateral Distance:    Right Eye Near:   Left Eye Near:    Bilateral Near:     Physical Exam Vitals reviewed.  Constitutional:      General: She is awake. She is not in acute distress.    Appearance: Normal appearance. She is well-developed. She is not ill-appearing.     Comments: Very pleasant female appears stated age in no acute distress sitting comfortably in exam room  HENT:     Head: Normocephalic and atraumatic.  Cardiovascular:     Rate and Rhythm: Normal rate and regular rhythm.     Heart sounds: Normal heart sounds, S1 normal and S2 normal. No murmur heard. Pulmonary:     Effort: Pulmonary effort is normal.     Breath sounds: Normal breath sounds. No wheezing, rhonchi or rales.     Comments: Clear to auscultation bilaterally Abdominal:     General: Bowel sounds are normal.     Palpations: Abdomen is soft.     Tenderness: There is no abdominal tenderness. There is no right CVA tenderness, left CVA tenderness, guarding or rebound.     Comments: Benign abdominal exam  Genitourinary:    Comments: Exam deferred Psychiatric:        Behavior: Behavior is cooperative.     UC Treatments / Results  Labs (all labs ordered are listed, but only abnormal results are displayed) Labs Reviewed  POCT URINALYSIS DIP (MANUAL ENTRY) - Abnormal; Notable for the following components:      Result Value   Leukocytes, UA Trace (*)    All other components within normal limits  URINE CULTURE  POCT URINE PREGNANCY  CERVICOVAGINAL ANCILLARY ONLY    EKG   Radiology No results found.  Procedures Procedures (including critical care time)  Medications Ordered in UC Medications - No data to display  Initial Impression / Assessment and  Plan / UC Course  I have reviewed the triage vital signs and the nursing notes.  Pertinent labs & imaging results that were available during my care of the patient were reviewed by me and  considered in my medical decision making (see chart for details).     Urine pregnancy test was negative in clinic today.  UA showed trace leukocyte esterase which is likely related to vaginal irritation.  Will defer antibiotic treatment until culture results are obtained and urine culture was sent out today.  Will empirically treat for yeast infection with Diflucan 2 doses spaced every 2 hours apart.  Recommended she use hypoallergenic soaps and detergents wear loosefitting cotton underwear.  STI swab was collected today-results pending.  We will determine if additional treatment is required based on swab results.  Discussed alarm symptoms that warrant emergent evaluation.  Strict return precautions given to which she expressed understanding.  Final Clinical Impressions(s) / UC Diagnoses   Final diagnoses:  Vaginal discharge  Vaginal irritation  Urinary frequency  Abnormal urinalysis     Discharge Instructions      Your urine did have a little bit of white blood cells we will send this off for culture.  If we need to arrange any treatment we will contact you to start antibiotics.  We are going to treat you for yeast infection.  Please take Diflucan every 72 hours for total of 2 doses.  We will contact you based on your swab results if need to arrange additional treatment.  If you have any worsening symptoms please return for reevaluation.  Use hypoallergenic soaps and detergents and wear loosefitting cotton underwear.     ED Prescriptions     Medication Sig Dispense Auth. Provider   fluconazole (DIFLUCAN) 150 MG tablet Take 1 tablet (150 mg total) by mouth every 3 (three) days as needed for up to 2 doses. 2 tablet Leathia Farnell, Noberto Retort, PA-C      PDMP not reviewed this encounter.   Jeani Hawking,  PA-C 12/31/20 1232

## 2020-12-31 NOTE — ED Triage Notes (Signed)
Pt c/o abdominal cramping and mild vaginal discharge (clear), frequency, vaginal odor,   Denies oliguria, hematuria, lower back pain, burning, itching  Onset 3-4 days ago. Requests sti check

## 2021-01-02 ENCOUNTER — Telehealth (HOSPITAL_COMMUNITY): Payer: Self-pay | Admitting: Emergency Medicine

## 2021-01-02 LAB — CERVICOVAGINAL ANCILLARY ONLY
Bacterial Vaginitis (gardnerella): POSITIVE — AB
Candida Glabrata: NEGATIVE
Candida Vaginitis: NEGATIVE
Chlamydia: NEGATIVE
Comment: NEGATIVE
Comment: NEGATIVE
Comment: NEGATIVE
Comment: NEGATIVE
Comment: NEGATIVE
Comment: NORMAL
Neisseria Gonorrhea: NEGATIVE
Trichomonas: NEGATIVE

## 2021-01-02 MED ORDER — METRONIDAZOLE 500 MG PO TABS: 500.0000 mg | ORAL_TABLET | Freq: Two times a day (BID) | ORAL | 0 refills | Status: DC

## 2021-01-03 ENCOUNTER — Telehealth (HOSPITAL_COMMUNITY): Payer: Self-pay | Admitting: Emergency Medicine

## 2021-01-03 LAB — URINE CULTURE: Culture: 10000 — AB

## 2021-01-03 MED ORDER — NITROFURANTOIN MONOHYD MACRO 100 MG PO CAPS: 100.0000 mg | ORAL_CAPSULE | Freq: Two times a day (BID) | ORAL | 0 refills | Status: DC

## 2021-01-31 ENCOUNTER — Ambulatory Visit
Admission: EM | Admit: 2021-01-31 | Discharge: 2021-01-31 | Disposition: A | Discharge status: Home / Self Care | Payer: No Typology Code available for payment source | Attending: Internal Medicine | Admitting: Internal Medicine

## 2021-01-31 ENCOUNTER — Other Ambulatory Visit: Payer: Self-pay

## 2021-01-31 DIAGNOSIS — N949 Unspecified condition associated with female genital organs and menstrual cycle: Secondary | ICD-10-CM | POA: Diagnosis present

## 2021-01-31 DIAGNOSIS — Z113 Encounter for screening for infections with a predominantly sexual mode of transmission: Secondary | ICD-10-CM | POA: Insufficient documentation

## 2021-01-31 NOTE — Discharge Instructions (Addendum)
Vaginal swab is pending.  We will call you if there are any abnormalities and treat as appropriate.  Please refrain from sexual activity at this time.

## 2021-01-31 NOTE — ED Triage Notes (Signed)
Pt c/o abd cramping, vaginal discomfort. States she thinks it's a recurring yeast infection.   Denies discharge, burning, stinging, itching, dysuria, hematuria, lower pelvic pain, new lower back pain, frequency, oliguria.   Onset last weekend. Also request sti screening.

## 2021-01-31 NOTE — ED Provider Notes (Signed)
[facility]    CSN: [id number] Arrival date & time: 01/31/21  1055      History   Chief Complaint Chief Complaint  Patient presents with   vaginal discomfort    HPI Janice White is a 30 y.o. female.   Patient presents with vaginal discomfort and irritation.  Denies vaginal discharge, urinary burning, urinary frequency, hematuria, irregular vaginal bleeding, fever, back pain, pelvic pain, abdominal pain, vaginal lesions.  Denies any known exposure to STD but is requesting STD testing.  Last menstrual cycle was 01/11/2021.    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 Authorizing Provider  albuterol (VENTOLIN HFA) 108 (90 Base) MCG/ACT inhaler Inhale 2 puffs into the lungs every 4 (four) hours as needed for wheezing or shortness of breath (or 15 minutes prior to exercise). 10/02/20   Tonny Bollman, MD  cetirizine (ZYRTEC ALLERGY) 10 MG tablet Take 1 tablet (10 mg total) by mouth daily as needed for allergies. 10/02/20   Tonny Bollman, MD  fluconazole (DIFLUCAN) 150 MG tablet Take 1 tablet (150 mg total) by mouth every 3 (three) days as needed for up to 2 doses. 12/31/20   Raspet, Noberto Retort, PA-C  fluticasone (FLONASE) 50 MCG/ACT nasal spray Place  1 spray into both nostrils in the morning and at bedtime. 08/29/20   Wallis Bamberg, PA-C  metroNIDAZOLE (FLAGYL) 500 MG tablet Take 1 tablet (500 mg total) by mouth 2 (two) times daily. 01/02/21   Lamptey, Britta Mccreedy, MD  nitrofurantoin, macrocrystal-monohydrate, (MACROBID) 100 MG capsule Take 1 capsule (100 mg total) by mouth 2 (two) times daily. 01/03/21   Lamptey, Britta Mccreedy, MD    Family History Family History  Problem Relation Age of Onset   Diabetes Mother    Hypertension Mother    Multiple sclerosis Mother    Heart disease Father    Stroke Father    Diabetes Maternal Grandmother     Social History Social History   Tobacco Use   Smoking status: Never   Smokeless tobacco: Never  Vaping Use   Vaping Use: Never used  Substance Use Topics   Alcohol use: No   Drug use: No     Allergies   Penicillins   Review of Systems Review of Systems Per HPI  Physical Exam Triage Vital Signs ED Triage Vitals  Enc Vitals Group     BP 01/31/21 1122 105/74     Pulse Rate 01/31/21 1122 85     Resp 01/31/21 1122 18  Temp 01/31/21 1122 98.9 F (37.2 C)     Temp Source 01/31/21 1122 Oral     SpO2 01/31/21 1122 98 %     Weight --      Height --      Head Circumference --      Peak Flow --      Pain Score 01/31/21 1123 0     Pain Loc --      Pain Edu? --      Excl. in [place]? --    No data found.  Updated Vital Signs BP 105/74 (BP Location: Left Arm)    Pulse 85    Temp 98.9 F (37.2 C) (Oral)    Resp 18    LMP 01/08/2021 (Exact Date)    SpO2 98%   Visual Acuity Right Eye Distance:   Left Eye Distance:   Bilateral Distance:    Right Eye Near:   Left Eye Near:    Bilateral Near:     Physical Exam Constitutional:      General: She is not in acute distress.    Appearance: Normal appearance. She is not toxic-appearing or diaphoretic.  HENT:     Head: Normocephalic and atraumatic.  Eyes:     Extraocular Movements: Extraocular movements intact.     Conjunctiva/sclera:  Conjunctivae normal.  Cardiovascular:     Rate and Rhythm: Normal rate and regular rhythm.     Pulses: Normal pulses.     Heart sounds: Normal heart sounds.  Pulmonary:     Effort: Pulmonary effort is normal. No respiratory distress.     Breath sounds: Normal breath sounds.  Abdominal:     General: Bowel sounds are normal. There is no distension.     Palpations: Abdomen is soft.  Genitourinary:    Comments: Deferred with shared decision making.  Self swab performed. Neurological:     General: No focal deficit present.     Mental Status: She is alert and oriented to person, place, and time. Mental status is at baseline.  Psychiatric:        Mood and Affect: Mood normal.        Behavior: Behavior normal.        Thought Content: Thought content normal.        Judgment: Judgment normal.     UC Treatments / Results  Labs (all labs ordered are listed, but only abnormal results are displayed) Labs Reviewed  CERVICOVAGINAL ANCILLARY ONLY    EKG   Radiology No results found.  Procedures Procedures (including critical care time)  Medications Ordered in UC Medications - No data to display  Initial Impression / Assessment and Plan / UC Course  I have reviewed the triage vital signs and the nursing notes.  Pertinent labs & imaging results that were available during my care of the patient were reviewed by me and considered in my medical decision making (see chart for details).  Clinical Course as of 01/31/21 1213  Fri Jan 31, 2021  1143 Cervicovaginal ancillary only [HM]    Clinical Course User Index [HM] Teodora Medici, [place]    Cervicovaginal swab pending.  Do not think that urinalysis is needed given that there are no urinary symptoms on exam.  Will await cervicovaginal swab for treatment.  Do not think that pelvic exam is necessary.  Patient has frequent vaginal infections so will await cervicovaginal swab. Patient to refrain from sexual activity until test results and  treatment are complete.  Discussed strict return  precautions.  Patient verbalized understanding and was agreeable with plan. Final Clinical Impressions(s) / UC Diagnoses   Final diagnoses:  Vaginal discomfort  Screening examination for venereal disease     Discharge Instructions      Vaginal swab is pending.  We will call you if there are any abnormalities and treat as appropriate.  Please refrain from sexual activity at this time.    ED Prescriptions   None    PDMP not reviewed this encounter.   Teodora Medici, [place] 01/31/21 1213

## 2021-02-03 ENCOUNTER — Telehealth (HOSPITAL_COMMUNITY): Payer: Self-pay | Admitting: Emergency Medicine

## 2021-02-03 LAB — CERVICOVAGINAL ANCILLARY ONLY
Bacterial Vaginitis (gardnerella): POSITIVE — AB
Candida Glabrata: NEGATIVE
Candida Vaginitis: POSITIVE — AB
Chlamydia: NEGATIVE
Comment: NEGATIVE
Comment: NEGATIVE
Comment: NEGATIVE
Comment: NEGATIVE
Comment: NEGATIVE
Comment: NORMAL
Neisseria Gonorrhea: NEGATIVE
Trichomonas: NEGATIVE

## 2021-02-03 MED ORDER — FLUCONAZOLE 150 MG PO TABS: 150.0000 mg | ORAL_TABLET | Freq: Once | ORAL | 0 refills | Status: AC

## 2021-02-03 MED ORDER — METRONIDAZOLE 500 MG PO TABS: 500.0000 mg | ORAL_TABLET | Freq: Two times a day (BID) | ORAL | 0 refills | Status: DC

## 2021-03-10 ENCOUNTER — Ambulatory Visit
Admission: EM | Admit: 2021-03-10 | Discharge: 2021-03-10 | Disposition: A | Discharge status: Home / Self Care | Payer: No Typology Code available for payment source | Attending: Internal Medicine | Admitting: Internal Medicine

## 2021-03-10 ENCOUNTER — Encounter: Payer: Self-pay | Admitting: Emergency Medicine

## 2021-03-10 ENCOUNTER — Other Ambulatory Visit: Payer: Self-pay

## 2021-03-10 DIAGNOSIS — J069 Acute upper respiratory infection, unspecified: Secondary | ICD-10-CM | POA: Diagnosis not present

## 2021-03-10 MED ORDER — CETIRIZINE HCL 10 MG PO TABS: 10.0000 mg | ORAL_TABLET | Freq: Every day | ORAL | 0 refills | Status: DC

## 2021-03-10 MED ORDER — FLUTICASONE PROPIONATE 50 MCG/ACT NA SUSP: 1.0000 | Freq: Every day | NASAL | 0 refills | Status: DC

## 2021-03-10 MED ORDER — BENZONATATE 100 MG PO CAPS: 100.0000 mg | ORAL_CAPSULE | Freq: Three times a day (TID) | ORAL | 0 refills | Status: DC | PRN

## 2021-03-10 NOTE — ED Provider Notes (Signed)
[facility]    CSN: [id number] Arrival date & time: 03/10/21  1445      History   Chief Complaint Chief Complaint  Patient presents with   Cough    HPI Janice White is a 31 y.o. female.   Patient presents with nonproductive cough, nasal congestion, runny nose, sore throat that has been present for 3 days.  Denies any known fevers or sick contacts.  Patient has taken TheraFlu and ibuprofen with minimal improvement.  Denies chest pain, shortness of breath, ear pain, nausea, vomiting, diarrhea, abdominal pain.   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 Authorizing Provider  benzonatate (TESSALON) 100 MG capsule Take 1 capsule (100 mg total) by mouth every 8 (eight) hours as needed for cough. 03/10/21  Yes Syliva Mee, Acie FredricksonHaley E, FNP  cetirizine (ZYRTEC) 10 MG tablet Take 1 tablet (10 mg total) by mouth daily. 03/10/21  Yes Amos Gaber, Acie FredricksonHaley E, FNP  fluticasone (FLONASE) 50 MCG/ACT nasal spray Place 1 spray into both nostrils daily for 3 days. 03/10/21 03/13/21 Yes Samuel Mcpeek, Acie FredricksonHaley E, FNP  albuterol (VENTOLIN HFA) 108 (90 Base) MCG/ACT inhaler Inhale 2 puffs into the lungs every 4 (four) hours as needed for wheezing or shortness of breath (or 15 minutes  prior to exercise). 10/02/20   Tonny Bollmanennis, Erin, MD  metroNIDAZOLE (FLAGYL) 500 MG tablet Take 1 tablet (500 mg total) by mouth 2 (two) times daily. 02/03/21   LampteyBritta Mccreedy, Philip O, MD  nitrofurantoin, macrocrystal-monohydrate, (MACROBID) 100 MG capsule Take 1 capsule (100 mg total) by mouth 2 (two) times daily. 01/03/21   Lamptey, Britta MccreedyPhilip O, MD    Family History Family History  Problem Relation Age of Onset   Diabetes Mother    Hypertension Mother    Multiple sclerosis Mother    Heart disease Father    Stroke Father    Diabetes Maternal Grandmother     Social History Social History   Tobacco Use   Smoking status: Never   Smokeless tobacco: Never  Vaping Use   Vaping Use: Never used  Substance Use Topics   Alcohol use: No   Drug use: No     Allergies   Penicillins   Review of Systems Review of Systems Per HPI  Physical Exam Triage Vital Signs ED Triage Vitals  Enc Vitals Group     BP 03/10/21 1625 126/80     Pulse Rate 03/10/21 1625 92     Resp 03/10/21 1625 18  Temp 03/10/21 1625 98.1 F (36.7 C)     Temp Source 03/10/21 1625 Oral     SpO2 03/10/21 1625 98 %     Weight 03/10/21 1626 130 lb (59 kg)     Height 03/10/21 1626 5' (1.524 m)     Head Circumference --      Peak Flow --      Pain Score 03/10/21 1625 8     Pain Loc --      Pain Edu? --      Excl. in [place]? --    No data found.  Updated Vital Signs BP 126/80 (BP Location: Left Arm)    Pulse 92    Temp 98.1 F (36.7 C) (Oral)    Resp 18    Ht 5' (1.524 m)    Wt 130 lb (59 kg)    LMP 03/02/2021    SpO2 98%    BMI 25.39 kg/m   Visual Acuity Right Eye Distance:   Left Eye Distance:   Bilateral Distance:    Right Eye Near:   Left Eye Near:    Bilateral Near:     Physical Exam Constitutional:      General: She is not in acute distress.    Appearance: Normal appearance. She is not toxic-appearing or diaphoretic.  HENT:     Head: Normocephalic and atraumatic.     Right Ear: Tympanic membrane and  ear canal normal.     Left Ear: Tympanic membrane and ear canal normal.     Nose: Congestion present.     Mouth/Throat:     Mouth: Mucous membranes are moist.     Pharynx: Posterior oropharyngeal erythema present.  Eyes:     Extraocular Movements: Extraocular movements intact.     Conjunctiva/sclera: Conjunctivae normal.     Pupils: Pupils are equal, round, and reactive to light.  Cardiovascular:     Rate and Rhythm: Normal rate and regular rhythm.     Pulses: Normal pulses.     Heart sounds: Normal heart sounds.  Pulmonary:     Effort: Pulmonary effort is normal. No respiratory distress.     Breath sounds: Normal breath sounds. No stridor. No wheezing, rhonchi or rales.  Abdominal:     General: Abdomen is flat. Bowel sounds are normal.     Palpations: Abdomen is soft.  Musculoskeletal:        General: Normal range of motion.     Cervical back: Normal range of motion.  Skin:    General: Skin is warm and dry.  Neurological:     General: No focal deficit present.     Mental Status: She is alert and oriented to person, place, and time. Mental status is at baseline.  Psychiatric:        Mood and Affect: Mood normal.        Behavior: Behavior normal.     UC Treatments / Results  Labs (all labs ordered are listed, but only abnormal results are displayed) Labs Reviewed  COVID-19, FLU A+B AND RSV    EKG   Radiology No results found.  Procedures Procedures (including critical care time)  Medications Ordered in UC Medications - No data to display  Initial Impression / Assessment and Plan / UC Course  I have reviewed the triage vital signs and the nursing notes.  Pertinent labs & imaging results that were available during my care of the patient were reviewed by me and considered in my medical decision making (see chart  for details).     Patient presents with symptoms likely from a viral upper respiratory infection. Differential includes bacterial pneumonia, sinusitis,  allergic rhinitis, COVID-19, flu. Do not suspect underlying cardiopulmonary process. Symptoms seem unlikely related to ACS, CHF or COPD exacerbations, pneumonia, pneumothorax. Patient is nontoxic appearing and not in need of emergent medical intervention.  Not think that strep testing is necessary given appearance of posterior pharynx on exam.  COVID-19, flu test is pending.  Recommended symptom control with over the counter medications: Daily oral anti-histamine, Oral decongestant or IN corticosteroid, saline irrigations, cepacol lozenges, Robitussin, Delsym, honey tea.  Patient sent prescriptions.  Return if symptoms fail to improve in 1-2 weeks or you develop shortness of breath, chest pain, severe headache. Patient states understanding and is agreeable.  Discharged with PCP followup.  Final Clinical Impressions(s) / UC Diagnoses   Final diagnoses:  Viral upper respiratory tract infection with cough     Discharge Instructions      It appears that you have a viral upper respiratory infection that should resolve on its own in the next few days.  You have been prescribed 3 medications to alleviate symptoms.    ED Prescriptions     Medication Sig Dispense Auth. Provider   cetirizine (ZYRTEC) 10 MG tablet Take 1 tablet (10 mg total) by mouth daily. 30 tablet [place], [place] E, [place]   fluticasone [facility]) 50 MCG/ACT nasal spray Place 1 spray into both nostrils daily for 3 days. 16 g Tayler Lassen, Rolly Salter E, [place]   benzonatate (TESSALON) 100 MG capsule Take 1 capsule (100 mg total) by mouth every 8 (eight) hours as needed for cough. 21 capsule [place], Acie Fredrickson, [place]      PDMP not reviewed this encounter.   Gustavus Bryant, [place] 03/10/21 1642

## 2021-03-10 NOTE — Discharge Instructions (Signed)
It appears that you have a viral upper respiratory infection that should resolve on its own in the next few days.  You have been prescribed 3 medications to alleviate symptoms.

## 2021-03-10 NOTE — ED Triage Notes (Signed)
Patient c/o cough, congestion, runny nose, sore throat x 3 days.  Patient denies fever.  COVID test was negative.  Patient has taken Thera-Flu and Ibuprofen.

## 2021-03-11 LAB — COVID-19, FLU A+B NAA
Influenza A, NAA: NOT DETECTED
Influenza B, NAA: NOT DETECTED
SARS-CoV-2, NAA: NOT DETECTED

## 2021-06-02 ENCOUNTER — Ambulatory Visit (HOSPITAL_COMMUNITY)
Admission: EM | Admit: 2021-06-02 | Discharge: 2021-06-02 | Disposition: A | Discharge status: Home / Self Care | Payer: No Typology Code available for payment source | Attending: Family Medicine | Admitting: Family Medicine

## 2021-06-02 ENCOUNTER — Encounter (HOSPITAL_COMMUNITY): Payer: Self-pay | Admitting: Emergency Medicine

## 2021-06-02 ENCOUNTER — Other Ambulatory Visit: Payer: Self-pay

## 2021-06-02 DIAGNOSIS — N898 Other specified noninflammatory disorders of vagina: Secondary | ICD-10-CM | POA: Insufficient documentation

## 2021-06-02 NOTE — ED Triage Notes (Signed)
Vaginal symptoms started yesterday.  Patient wanted to make sure this is not an std.  Has had vaginal discharge.   ?

## 2021-06-02 NOTE — Discharge Instructions (Addendum)
Staff will call you if there are any positives on the test so that you can be treated ?

## 2021-06-02 NOTE — ED Provider Notes (Signed)
?[facility] ? ? ? ?CSN: [id number] ?Arrival date & time: 06/02/21  4098 ? ? ?  ? ?History   ?Chief Complaint ?Chief Complaint  ?Patient presents with  ? Vaginal Discharge  ? ? ?HPI ?NELLA BOTSFORD is a 31 y.o. female.  ? ? ?Vaginal Discharge ?Here for a several day history of vaginal discharge and some intermittent itching.  She wonders if it is a yeast infection. ? ?Last menses was at the beginning of this month on time ? ?No fever or chills.  No abdominal pain. ? ?Past Medical History:  ?Diagnosis Date  ? Asthma   ? PONV (postoperative nausea and vomiting)   ? ? ?Patient Active Problem List  ? Diagnosis Date Noted  ? Mild intermittent asthma without complication 10/02/2020  ? Penicillin allergy 10/02/2020  ? 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 Postpartum care following vaginal delivery 01/28/2018  ? Normal labor and delivery 01/26/2018  ? Irregular menstrual cycle 06/09/2016  ? ? ?Past Surgical History:  ?Procedure Laterality Date  ? WISDOM TOOTH EXTRACTION    ? ? ?OB History   ? ? Gravida  ?1  ? Para  ?1  ? Term  ?1  ? Preterm  ?0  ? AB  ?0  ? Living  ?1  ?  ? ? SAB  ?0  ? IAB  ?0  ? Ectopic  ?0  ? Multiple  ?0  ? Live Births  ?1  ?   ?  ?  ? ? ? ?Home Medications   ? ?Prior to Admission medications   ?Medication Sig Start Date End Date Taking? Authorizing Provider  ?albuterol (VENTOLIN HFA) 108 (90 Base) MCG/ACT inhaler Inhale 2 puffs into the lungs every 4 (four) hours as needed for wheezing or shortness of breath (or 15 minutes prior to exercise). 10/02/20   Tonny Bollman, MD  ?cetirizine (ZYRTEC) 10 MG tablet Take 1 tablet (10 mg total) by mouth daily. 03/10/21   Gustavus Bryant, FNP  ?fluticasone (FLONASE) 50 MCG/ACT nasal spray Place 1 spray into both nostrils daily for 3 days. 03/10/21 03/13/21  Gustavus Bryant, FNP  ? ? ?Family History ?Family History  ?Problem Relation Age of Onset  ? Diabetes Mother   ? Hypertension Mother   ? Multiple sclerosis Mother   ? Heart disease Father   ? Stroke Father   ?  Diabetes Maternal Grandmother   ? ? ?Social History ?Social History  ? ?Tobacco Use  ? Smoking status: Never  ? Smokeless tobacco: Never  ?Vaping Use  ? Vaping Use: Never used  ?Substance Use Topics  ? Alcohol use: No  ? Drug use: No  ? ? ? ?Allergies   ?Penicillins ? ? ?Review of Systems ?Review of Systems  ?Genitourinary:  Positive for vaginal discharge.  ? ? ?Physical Exam ?Triage Vital Signs ?ED Triage Vitals  ?Enc Vitals Group  ?   BP 06/02/21 0846 121/83  ?   Pulse Rate 06/02/21 0846 80  ?   Resp 06/02/21 0846 18  ?   Temp 06/02/21 0846 98.5 ?F (36.9 ?C)  ?   Temp Source 06/02/21 0846 Oral  ?   SpO2 06/02/21 0846 100 %  ?   Weight --   ?   Height --   ?   Head Circumference --   ?   Peak Flow --   ?   Pain Score 06/02/21 0843 0  ?   Pain Loc --   ?   Pain Edu? --   ?  Excl. in [place]? --   ? ?No data found. ? ?Updated Vital Signs ?BP 121/83 (BP Location: Left Arm)   Pulse 80   Temp 98.5 ?F (36.9 ?C) (Oral)   Resp 18   LMP 05/19/2021 (Approximate)   SpO2 100%  ? ?Visual Acuity ?Right Eye Distance:   ?Left Eye Distance:   ?Bilateral Distance:   ? ?Right Eye Near:   ?Left Eye Near:    ?Bilateral Near:    ? ?Physical Exam ?Vitals and nursing note reviewed.  ?Constitutional:   ?   General: She is not in acute distress. ?   Appearance: She is not ill-appearing, toxic-appearing or diaphoretic.  ?Cardiovascular:  ?   Rate and Rhythm: Normal rate and regular rhythm.  ?Pulmonary:  ?   Effort: Pulmonary effort is normal.  ?   Breath sounds: Normal breath sounds.  ?Abdominal:  ?   Palpations: Abdomen is soft.  ?   Tenderness: There is no abdominal tenderness.  ?Neurological:  ?   Mental Status: She is oriented to person, place, and time.  ?Psychiatric:     ?   Behavior: Behavior normal.  ? ? ? ?UC Treatments / Results  ?Labs ?(all labs ordered are listed, but only abnormal results are displayed) ?Labs Reviewed  ?CERVICOVAGINAL ANCILLARY ONLY  ? ? ?EKG ? ? ?Radiology ?No results found. ? ?Procedures ?Procedures  (including critical care time) ? ?Medications Ordered in UC ?Medications - No data to display ? ?Initial Impression / Assessment and Plan / UC Course  ?I have reviewed the triage vital signs and the nursing notes. ? ?Pertinent labs & imaging results that were available during my care of the patient were reviewed by me and considered in my medical decision making (see chart for details). ? ?  ? ?Self swab done today, and staff will call and treat per protocol if there are any positives ?Final Clinical Impressions(s) / UC Diagnoses  ? ?Final diagnoses:  ?Vaginal discharge  ? ? ? ?Discharge Instructions   ? ?  ?Staff will call you if there are any positives on the test so that you can be treated ? ? ? ? ?ED Prescriptions   ?None ?  ? ?PDMP not reviewed this encounter. ?  ?Zenia Resides, MD ?06/02/21 [phone] ? ?

## 2021-06-03 ENCOUNTER — Telehealth (HOSPITAL_COMMUNITY): Payer: Self-pay | Admitting: Emergency Medicine

## 2021-06-03 LAB — CERVICOVAGINAL ANCILLARY ONLY
Bacterial Vaginitis (gardnerella): POSITIVE — AB
Candida Glabrata: NEGATIVE
Candida Vaginitis: NEGATIVE
Chlamydia: NEGATIVE
Comment: NEGATIVE
Comment: NEGATIVE
Comment: NEGATIVE
Comment: NEGATIVE
Comment: NEGATIVE
Comment: NORMAL
Neisseria Gonorrhea: NEGATIVE
Trichomonas: NEGATIVE

## 2021-06-03 MED ORDER — METRONIDAZOLE 500 MG PO TABS: 500.0000 mg | ORAL_TABLET | Freq: Two times a day (BID) | ORAL | 0 refills | Status: DC

## 2021-06-28 IMAGING — DX DG CHEST 2V
2 series · 2 of 2 positions shown · non-contrast
Comparison: 11/18/2017

CLINICAL DATA: Chest pain

EXAM:
CHEST - 2 VIEW

[w chest pa]
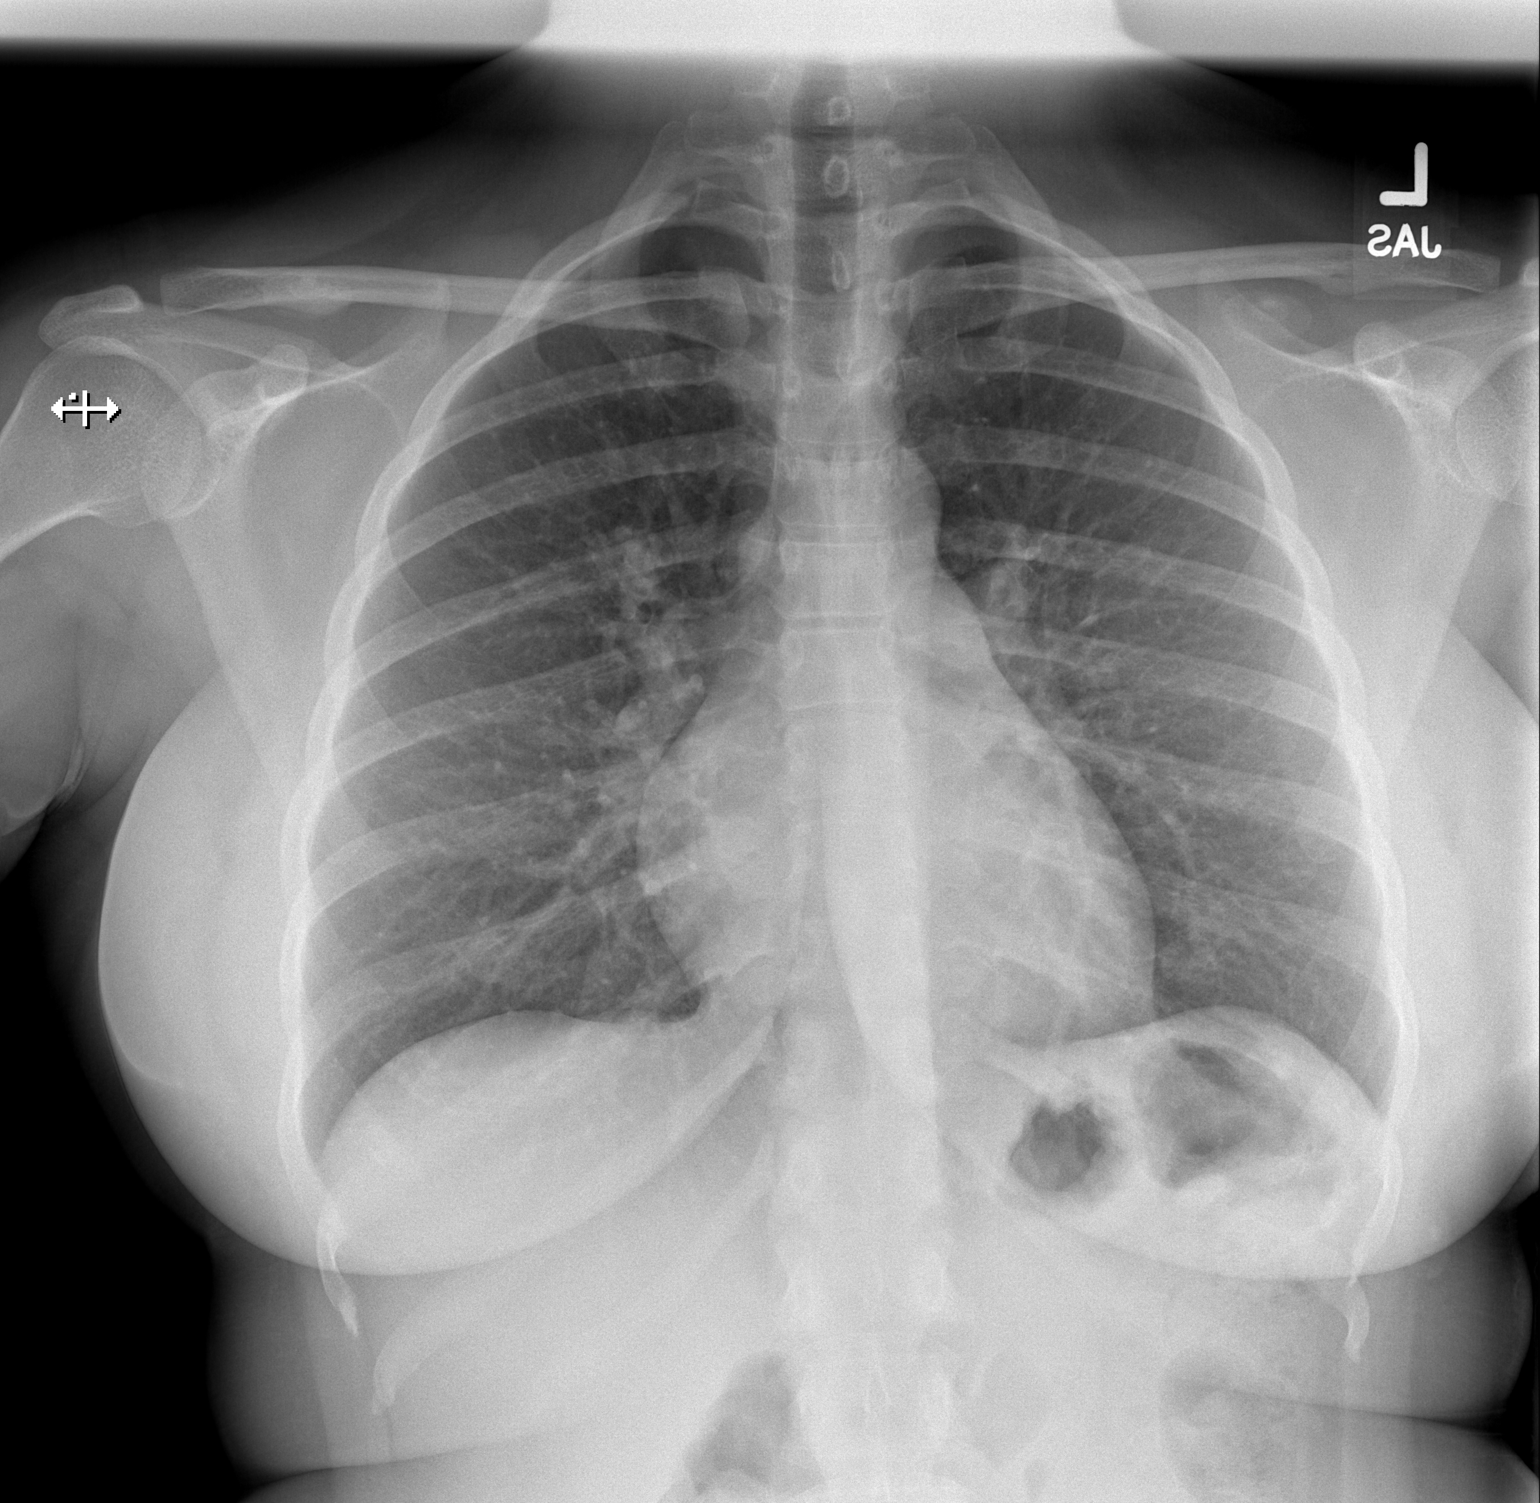

[w chest lat]
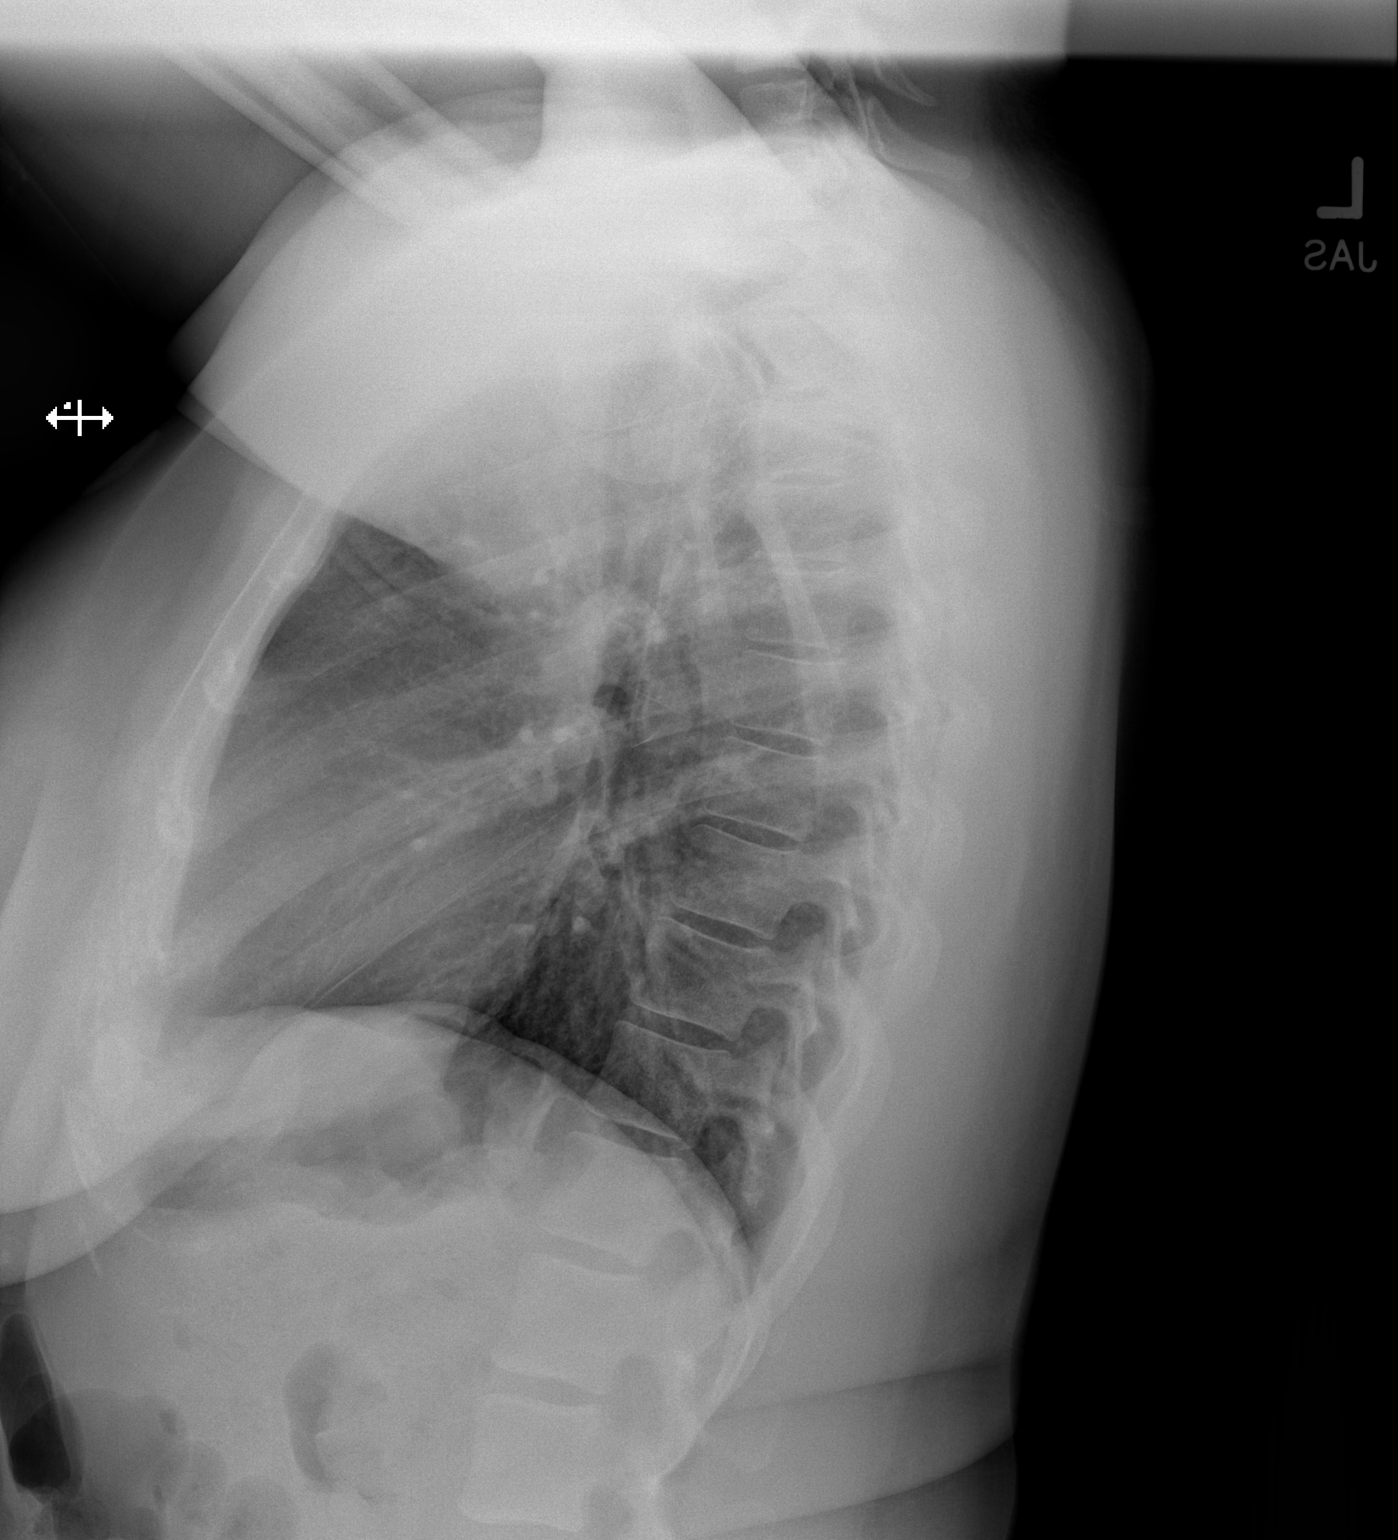

[2 of 2 positions shown; findings below may reference images not displayed]

FINDINGS: The heart size and mediastinal contours are within normal limits.
Both lungs are clear. The visualized skeletal structures are
unremarkable.
IMPRESSION: No active cardiopulmonary disease.

## 2021-07-11 ENCOUNTER — Ambulatory Visit (HOSPITAL_COMMUNITY)
Admission: EM | Admit: 2021-07-11 | Discharge: 2021-07-11 | Disposition: A | Discharge status: Home / Self Care | Payer: No Typology Code available for payment source | Attending: Emergency Medicine | Admitting: Emergency Medicine

## 2021-07-11 ENCOUNTER — Encounter (HOSPITAL_COMMUNITY): Payer: Self-pay

## 2021-07-11 DIAGNOSIS — R0981 Nasal congestion: Secondary | ICD-10-CM | POA: Insufficient documentation

## 2021-07-11 DIAGNOSIS — J029 Acute pharyngitis, unspecified: Secondary | ICD-10-CM | POA: Insufficient documentation

## 2021-07-11 DIAGNOSIS — R059 Cough, unspecified: Secondary | ICD-10-CM | POA: Insufficient documentation

## 2021-07-11 DIAGNOSIS — Z20822 Contact with and (suspected) exposure to covid-19: Secondary | ICD-10-CM | POA: Diagnosis not present

## 2021-07-11 DIAGNOSIS — J069 Acute upper respiratory infection, unspecified: Secondary | ICD-10-CM | POA: Insufficient documentation

## 2021-07-11 LAB — POCT RAPID STREP A, ED / UC: Streptococcus, Group A Screen (Direct): NEGATIVE

## 2021-07-11 LAB — POC INFLUENZA A AND B ANTIGEN (URGENT CARE ONLY)
INFLUENZA A ANTIGEN, POC: NEGATIVE
INFLUENZA B ANTIGEN, POC: NEGATIVE

## 2021-07-11 NOTE — ED Provider Notes (Signed)
[facility]    CSN: [id number] Arrival date & time: 07/11/21  1422      History   Chief Complaint Chief Complaint  Patient presents with   Nasal Congestion   Cough   Generalized Body Aches    HPI Janice White is a 31 y.o. female.   Patient presents with nasal congestion, rhinorrhea, sore throat, nonproductive cough, body aches and a generalized headache for 2 days.  Tolerating food and liquids.  No known sick contacts.  Has attempted use of antihistamine ibuprofen which have been ineffective.  History of asthma.  Denies shortness of breath, wheezing, fevers .   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 Authorizing Provider  metroNIDAZOLE (FLAGYL) 500 MG tablet Take 1 tablet (500 mg total) by mouth 2 (two) times daily. 06/03/21   Lamptey, Britta Mccreedy, MD  albuterol (VENTOLIN HFA) 108 (90 Base) MCG/ACT inhaler Inhale 2 puffs into the lungs every 4 (four) hours as needed for wheezing or shortness of breath (or 15 minutes prior to exercise). 10/02/20   Tonny Bollman, MD  cetirizine (ZYRTEC) 10 MG tablet Take 1 tablet (10 mg total) by mouth daily. 03/10/21   Gustavus Bryant, FNP  fluticasone (FLONASE) 50 MCG/ACT nasal spray Place 1 spray into both  nostrils daily for 3 days. 03/10/21 03/13/21  Gustavus Bryant, FNP    Family History Family History  Problem Relation Age of Onset   Diabetes Mother    Hypertension Mother    Multiple sclerosis Mother    Heart disease Father    Stroke Father    Diabetes Maternal Grandmother     Social History Social History   Tobacco Use   Smoking status: Never   Smokeless tobacco: Never  Vaping Use   Vaping Use: Never used  Substance Use Topics   Alcohol use: No   Drug use: No     Allergies   Penicillins   Review of Systems Review of Systems  Constitutional: Negative.   HENT:  Positive for congestion, rhinorrhea and sore throat. Negative for dental problem, drooling, ear discharge, ear pain, facial swelling, hearing loss, mouth sores, nosebleeds, postnasal drip, sinus pressure, sinus pain, sneezing, tinnitus, trouble swallowing and voice change.   Respiratory:  Positive for cough. Negative for apnea, choking, chest tightness, shortness of breath, wheezing and stridor.   Cardiovascular: Negative.   Gastrointestinal: Negative.   Musculoskeletal:  Positive for myalgias. Negative for arthralgias, back pain, gait problem, joint swelling, neck pain and neck  stiffness.  Skin: Negative.   Neurological:  Positive for headaches. Negative for dizziness, tremors, seizures, syncope, facial asymmetry, speech difficulty, weakness, light-headedness and numbness.    Physical Exam Triage Vital Signs ED Triage Vitals [07/11/21 1511]  Enc Vitals Group     BP 129/85     Pulse Rate 85     Resp 18     Temp 99.3 F (37.4 C)     Temp Source Oral     SpO2 100 %     Weight      Height      Head Circumference      Peak Flow      Pain Score      Pain Loc      Pain Edu?      Excl. in GC?    No data found.  Updated Vital Signs BP 129/85 (BP Location: Left Arm)   Pulse 85   Temp 99.3 F (37.4 C) (Oral)   Resp 18   SpO2 100%   Visual Acuity Right Eye Distance:   Left Eye Distance:    Bilateral Distance:    Right Eye Near:   Left Eye Near:    Bilateral Near:     Physical Exam Constitutional:      Appearance: Normal appearance.  HENT:     Head: Normocephalic.     Right Ear: Tympanic membrane, ear canal and external ear normal.     Left Ear: Tympanic membrane, ear canal and external ear normal.     Nose: Congestion and rhinorrhea present.     Mouth/Throat:     Mouth: Mucous membranes are moist.     Pharynx: No posterior oropharyngeal erythema.  Eyes:     Extraocular Movements: Extraocular movements intact.  Cardiovascular:     Rate and Rhythm: Normal rate and regular rhythm.     Pulses: Normal pulses.     Heart sounds: Normal heart sounds.  Pulmonary:     Effort: Pulmonary effort is normal.     Breath sounds: Normal breath sounds.  Musculoskeletal:     Cervical back: Normal range of motion and neck supple.  Skin:    General: Skin is warm and dry.  Neurological:     Mental Status: She is alert and oriented to person, place, and time. Mental status is at baseline.  Psychiatric:        Mood and Affect: Mood normal.        Behavior: Behavior normal.     UC Treatments / Results  Labs (all labs ordered are listed, but only abnormal results are displayed) Labs Reviewed  POCT INFECTIOUS MONO SCREEN, ED / UC    EKG   Radiology No results found.  Procedures Procedures (including critical care time)  Medications Ordered in UC Medications - No data to display  Initial Impression / Assessment and Plan / UC Course  I have reviewed the triage vital signs and the nursing notes.  Pertinent labs & imaging results that were available during my care of the patient were reviewed by me and considered in my medical decision making (see chart for details).  Viral URI with cough  Vital signs are stable, patient is in no signs of distress, etiology of symptoms is most likely viral, flu and strep test negative, sent for culture, COVID test pending, may use  over-the-counter medications for supportive care with follow-up with urgent care as needed, work note given Final Clinical Impressions(s) / UC Diagnoses   Final diagnoses:  None  Discharge Instructions   None    ED Prescriptions   None    PDMP not reviewed this encounter.   Valinda Hoar, NP 07/11/21 1615

## 2021-07-11 NOTE — Discharge Instructions (Addendum)
Your symptoms today are most likely being caused by a virus and should steadily improve in time it can take up to 7 to 10 days before you truly start to see a turnaround however things will get better  Flu and strep test negative, COVID test is pending, you will only be notified of positive test results    You can take Tylenol and/or Ibuprofen as needed for fever reduction and pain relief.   For cough: honey 1/2 to 1 teaspoon (you can dilute the honey in water or another fluid).  You can also use guaifenesin and dextromethorphan for cough. You can use a humidifier for chest congestion and cough.  If you don't have a humidifier, you can sit in the bathroom with the hot shower running.      For sore throat: try warm salt water gargles, cepacol lozenges, throat spray, warm tea or water with lemon/honey, popsicles or ice, or OTC cold relief medicine for throat discomfort.   For congestion: take a daily anti-histamine like Zyrtec, Claritin, and a oral decongestant, such as pseudoephedrine.  You can also use Flonase 1-2 sprays in each nostril daily.   It is important to stay hydrated: drink plenty of fluids (water, gatorade/powerade/pedialyte, juices, or teas) to keep your throat moisturized and help further relieve irritation/discomfort.

## 2021-07-11 NOTE — ED Triage Notes (Signed)
Onset 2-3 days with nasal congestion and cough. Pt is requesting a flu and covid test.

## 2021-07-12 LAB — SARS CORONAVIRUS 2 (TAT 6-24 HRS): SARS Coronavirus 2: NEGATIVE

## 2021-07-14 LAB — CULTURE, GROUP A STREP (THRC)

## 2021-07-22 ENCOUNTER — Ambulatory Visit (HOSPITAL_COMMUNITY)
Admission: EM | Admit: 2021-07-22 | Discharge: 2021-07-22 | Disposition: A | Discharge status: Home / Self Care | Payer: No Typology Code available for payment source | Attending: Emergency Medicine | Admitting: Emergency Medicine

## 2021-07-22 ENCOUNTER — Encounter (HOSPITAL_COMMUNITY): Payer: Self-pay | Admitting: Emergency Medicine

## 2021-07-22 DIAGNOSIS — M546 Pain in thoracic spine: Secondary | ICD-10-CM

## 2021-07-22 DIAGNOSIS — M542 Cervicalgia: Secondary | ICD-10-CM | POA: Diagnosis not present

## 2021-07-22 MED ORDER — CYCLOBENZAPRINE HCL 10 MG PO TABS: 10.0000 mg | ORAL_TABLET | Freq: Two times a day (BID) | ORAL | 0 refills | Status: DC | PRN

## 2021-07-22 MED ORDER — PREDNISONE 20 MG PO TABS: 40.0000 mg | ORAL_TABLET | Freq: Every day | ORAL | 0 refills | Status: DC
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
## 2021-07-22 NOTE — Discharge Instructions (Signed)
Your pain is most likely caused by irritation to the muscles.  Starting tomorrow take prednisone every morning with food for 5 days, this is to reduce the inflammatory occurs with injury  You may use Flexeril twice daily as needed for additional comfort, be mindful this medication may make you drowsy  You may use heating pad in 15 minute intervals as needed for additional comfort, within the first 2-3 days you may find comfort in using ice in 10-15 minutes over affected area  Begin stretching affected area daily for 10 minutes as tolerated to further loosen muscles   When lying down place pillow underneath and between knees for support  Can try sleeping without pillow on firm mattress   Practice good posture: head back, shoulders back, chest forward, pelvis back and weight distributed evenly on both legs  If pain persist after recommended treatment or reoccurs if may be beneficial to follow up with orthopedic specialist for evaluation, this doctor specializes in the bones and can manage your symptoms long-term with options such as but not limited to imaging, medications or physical therapy

## 2021-09-25 ENCOUNTER — Ambulatory Visit (HOSPITAL_COMMUNITY)
Admission: EM | Admit: 2021-09-25 | Discharge: 2021-09-25 | Disposition: A | Discharge status: Home / Self Care | Payer: No Typology Code available for payment source | Attending: Internal Medicine | Admitting: Internal Medicine

## 2021-09-25 ENCOUNTER — Encounter (HOSPITAL_COMMUNITY): Payer: Self-pay | Admitting: *Deleted

## 2021-09-25 DIAGNOSIS — Z202 Contact with and (suspected) exposure to infections with a predominantly sexual mode of transmission: Secondary | ICD-10-CM

## 2021-09-25 DIAGNOSIS — Z113 Encounter for screening for infections with a predominantly sexual mode of transmission: Secondary | ICD-10-CM | POA: Insufficient documentation

## 2021-09-25 LAB — HIV ANTIBODY (ROUTINE TESTING W REFLEX): HIV Screen 4th Generation wRfx: NONREACTIVE

## 2021-09-25 NOTE — Discharge Instructions (Addendum)
Please avoid sexual intercourse until lab results are available Safe sex practices advised Will call you with recommendations if labs are abnormal Return to urgent care if you have any other concerns.

## 2021-09-25 NOTE — ED Provider Notes (Addendum)
[facility]    CSN: [id number] Arrival date & time: 09/25/21  1226         History   Chief Complaint Chief Complaint  Patient presents with   STD Check    HPI Janice White is a 31 y.o. female comes to the urgent care requesting STD screening.  Patient has no symptoms.  Sexual partners are unchanged.  Patient engages in unprotected sexual intercourse with her partner. 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 Authorizing Provider  Ferrous Sulfate (IRON PO) Take by mouth.   Yes [provider]  albuterol (VENTOLIN HFA) 108 (90 Base) MCG/ACT inhaler Inhale 2 puffs into the lungs every 4 (four) hours as needed for wheezing or shortness of breath (or 15 minutes prior to exercise). 10/02/20   Verlee Monte, MD  fluticasone (FLONASE) 50 MCG/ACT nasal spray Place 1 spray into both nostrils daily for 3 days. 03/10/21 03/13/21  Gustavus Bryant, FNP    Family History Family History  Problem Relation Age of Onset   Diabetes Mother    Hypertension Mother    Multiple sclerosis Mother    Heart disease Father    Stroke Father    Diabetes Maternal Grandmother     Social History Social History   Tobacco Use   Smoking status: Never    Smokeless tobacco: Never  Vaping Use   Vaping Use: Never used  Substance Use Topics   Alcohol use: Not Currently   Drug use: No     Allergies   Patient has no active allergies.   Review of Systems Review of Systems As per HPI  Physical Exam Triage Vital Signs ED Triage Vitals [09/25/21 1255]  Enc Vitals Group     BP 113/72     Pulse Rate 71     Resp 16     Temp 98.2 F (36.8 C)     Temp Source Oral     SpO2 100 %     Weight      Height      Head Circumference      Peak Flow      Pain Score 0     Pain Loc      Pain Edu?      Excl. in GC?    No data found.  Updated Vital Signs BP 113/72   Pulse 71   Temp 98.2 F (36.8 C) (Oral)   Resp 16   LMP 08/31/2021 (Approximate)  SpO2 100%   Visual Acuity Right Eye Distance:   Left Eye Distance:   Bilateral Distance:    Right Eye Near:   Left Eye Near:    Bilateral Near:     Physical Exam Vitals and nursing note reviewed.  Constitutional:      General: She is not in acute distress.    Appearance: She is not ill-appearing.  Cardiovascular:     Rate and Rhythm: Normal rate and regular rhythm.  Musculoskeletal:        General: Normal range of motion.  Neurological:     Mental Status: She is alert.      UC Treatments / Results  Labs (all labs ordered are listed, but only abnormal results are displayed) Labs Reviewed  HIV ANTIBODY (ROUTINE TESTING W REFLEX)  RPR  CERVICOVAGINAL ANCILLARY ONLY    EKG   Radiology No results found.  Procedures Procedures (including critical care time)  Medications Ordered in UC Medications - No data to display  Initial Impression / Assessment and Plan / UC Course  I have reviewed the triage vital signs and the nursing notes.  Pertinent labs & imaging results that were available during my care of the patient were reviewed by me and considered in my medical decision making (see chart for details).     1.  Screening for STDs: Cervicovaginal swab for  GC/chlamydia/trichomonas HIV/RPR Will call patient with recommendations if labs are abnormal Return precautions given Safe sex practices advised Final Clinical Impressions(s) / UC Diagnoses   Final diagnoses:  Screening for STDs (sexually transmitted diseases)     Discharge Instructions      Please avoid sexual intercourse until lab results are available Safe sex practices advised Will call you with recommendations if labs are abnormal Return to urgent care if you have any other concerns.   ED Prescriptions   None    PDMP not reviewed this encounter.   Merrilee Jansky, MD 09/25/21 1338    Merrilee Jansky, MD 09/25/21 [phone]

## 2021-09-25 NOTE — ED Triage Notes (Signed)
Pt denies any sxs. Wishes to be checked for STDs.

## 2021-09-26 LAB — CERVICOVAGINAL ANCILLARY ONLY
Chlamydia: NEGATIVE
Comment: NEGATIVE
Comment: NEGATIVE
Comment: NORMAL
Neisseria Gonorrhea: NEGATIVE
Trichomonas: NEGATIVE

## 2021-09-26 LAB — RPR: RPR Ser Ql: NONREACTIVE

## 2021-11-15 ENCOUNTER — Ambulatory Visit
Admission: EM | Admit: 2021-11-15 | Discharge: 2021-11-15 | Disposition: A | Discharge status: Home / Self Care | Payer: Self-pay | Attending: Physician Assistant | Admitting: Physician Assistant

## 2021-11-15 DIAGNOSIS — J069 Acute upper respiratory infection, unspecified: Secondary | ICD-10-CM | POA: Insufficient documentation

## 2021-11-15 DIAGNOSIS — Z1152 Encounter for screening for COVID-19: Secondary | ICD-10-CM | POA: Insufficient documentation

## 2021-11-15 LAB — POCT RAPID STREP A (OFFICE): Rapid Strep A Screen: NEGATIVE

## 2021-11-15 LAB — SARS CORONAVIRUS 2 (TAT 6-24 HRS): SARS Coronavirus 2: NEGATIVE

## 2021-11-15 NOTE — ED Triage Notes (Signed)
Pt presents with sore throat X 3 days.  

## 2021-11-15 NOTE — ED Provider Notes (Signed)
[facility]    CSN: [id number] Arrival date & time: 11/15/21  6295      History   Chief Complaint Chief Complaint  Patient presents with   Sore Throat    HPI Janice White is a 31 y.o. female.   Patient here today for evaluation of sore throat, hoarse voice, and cough that started 3 days ago. She has not had any fever. She denies any significant congestion. She has not had any nausea, vomiting or diarrhea. She has tried taking OTC meds without significant relief.   The history is provided by the patient.    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 Authorizing Provider  albuterol (VENTOLIN HFA) 108 (90 Base) MCG/ACT inhaler Inhale 2 puffs into the lungs every 4 (four) hours as needed for wheezing or shortness of breath (or 15 minutes prior to exercise). 10/02/20   Clemon Chambers, MD  Ferrous Sulfate (IRON PO) Take by mouth.    [provider]  fluticasone (FLONASE) 50 MCG/ACT nasal spray Place 1 spray into both nostrils daily for 3 days. 03/10/21 03/13/21  Teodora Medici, FNP    Family History Family History  Problem Relation Age of Onset   Diabetes Mother    Hypertension Mother    Multiple sclerosis Mother    Heart disease  Father    Stroke Father    Diabetes Maternal Grandmother     Social History Social History   Tobacco Use   Smoking status: Never   Smokeless tobacco: Never  Vaping Use   Vaping Use: Never used  Substance Use Topics   Alcohol use: Not Currently   Drug use: No     Allergies   Patient has no known allergies.   Review of Systems Review of Systems  Constitutional:  Negative for chills and fever.  HENT:  Positive for congestion, sore throat and voice change. Negative for ear pain.   Eyes:  Negative for discharge and redness.  Respiratory:  Positive for cough. Negative for shortness of breath and wheezing.   Gastrointestinal:  Negative for abdominal pain, diarrhea, nausea and vomiting.     Physical Exam Triage Vital Signs ED Triage Vitals  Enc Vitals Group     BP      Pulse      Resp      Temp      Temp src      SpO2      Weight      Height  Head Circumference      Peak Flow      Pain Score      Pain Loc      Pain Edu?      Excl. in [place]?    No data found.  Updated Vital Signs BP 124/76 (BP Location: Left Arm)   Pulse 80   Temp 98.7 F (37.1 C) (Oral)   Resp 17   LMP 10/23/2021   SpO2 98%   Physical Exam Vitals and nursing note reviewed.  Constitutional:      General: She is not in acute distress.    Appearance: Normal appearance. She is not ill-appearing.  HENT:     Head: Normocephalic and atraumatic.     Nose: Congestion present.     Mouth/Throat:     Mouth: Mucous membranes are moist.     Pharynx: Posterior oropharyngeal erythema present. No oropharyngeal exudate.     Comments: Hoarse voice noted Eyes:     Conjunctiva/sclera: Conjunctivae normal.  Cardiovascular:     Rate and Rhythm: Normal rate and regular rhythm.     Heart sounds: Normal heart sounds. No murmur heard. Pulmonary:     Effort: Pulmonary effort is normal. No respiratory distress.     Breath sounds: Normal breath sounds. No wheezing, rhonchi or rales.  Skin:    General:  Skin is warm and dry.  Neurological:     Mental Status: She is alert.  Psychiatric:        Mood and Affect: Mood normal.        Thought Content: Thought content normal.      UC Treatments / Results  Labs (all labs ordered are listed, but only abnormal results are displayed) Labs Reviewed  SARS CORONAVIRUS 2 (TAT 6-24 HRS)  POCT RAPID STREP A (OFFICE)    EKG   Radiology No results found.  Procedures Procedures (including critical care time)  Medications Ordered in UC Medications - No data to display  Initial Impression / Assessment and Plan / UC Course  I have reviewed the triage vital signs and the nursing notes.  Pertinent labs & imaging results that were available during my care of the patient were reviewed by me and considered in my medical decision making (see chart for details).    Suspect viral etiology of symptoms. Rapid strep negative. Screening ordered for Covid. Recommended symptomatic treatment, increased fluids and rest.   Final Clinical Impressions(s) / UC Diagnoses   Final diagnoses:  Encounter for screening for COVID-19  Acute upper respiratory infection   Discharge Instructions   None    ED Prescriptions   None    PDMP not reviewed this encounter.   Tomi Bamberger, PA-C 11/15/21 [phone]

## 2021-12-22 ENCOUNTER — Ambulatory Visit (HOSPITAL_COMMUNITY)
Admission: EM | Admit: 2021-12-22 | Discharge: 2021-12-22 | Disposition: A | Discharge status: Home / Self Care | Payer: Self-pay | Attending: Internal Medicine | Admitting: Internal Medicine

## 2021-12-22 ENCOUNTER — Encounter (HOSPITAL_COMMUNITY): Payer: Self-pay | Admitting: Emergency Medicine

## 2021-12-22 ENCOUNTER — Ambulatory Visit: Admission: EM | Admit: 2021-12-22 | Discharge: 2021-12-22 | Discharge status: Against Medical Advice | Payer: Self-pay

## 2021-12-22 DIAGNOSIS — J069 Acute upper respiratory infection, unspecified: Secondary | ICD-10-CM

## 2021-12-22 MED ORDER — ERYTHROMYCIN 5 MG/GM OP OINT: TOPICAL_OINTMENT | Freq: Every day | OPHTHALMIC | 0 refills | Status: AC

## 2021-12-22 MED ORDER — BENZONATATE 100 MG PO CAPS: 100.0000 mg | ORAL_CAPSULE | Freq: Three times a day (TID) | ORAL | 0 refills | Status: DC | PRN

## 2021-12-22 NOTE — Discharge Instructions (Signed)
Please clean your eyes with a warm wet rag I will send erythromycin eye ointment to the pharmacy, give yourself a couple of days and if your symptoms worsen i.e. worsening eye discharge please pick up the erythromycin eye ointment.  Otherwise I expect your symptoms to resolve. Tylenol/Motrin as needed for pain and/or fever Tessalon Perles as needed for cough Return to urgent care if symptoms persist or worsens.

## 2021-12-22 NOTE — ED Triage Notes (Signed)
Pt reports a cough x 1 week and right eye drainage this morning. States her son has pink eye.

## 2021-12-22 NOTE — ED Provider Notes (Signed)
[facility]    CSN: [id number] Arrival date & time: 12/22/21  0803      History   Chief Complaint Chief Complaint  Patient presents with   Cough   Eye Problem    HPI Janice White is a 31 y.o. female comes to the urgent care with 1 week history of nonproductive cough and right eye redness this morning.  Patient describes her cough as nonproductive with no wheezing or shortness of breath.  Patient's son has similar symptoms.  She denies any fever or chills.  No chest pain or chest pressure.  Patient denies any wheezing.  No dizziness, near syncope or syncopal episodes.  This morning patient noticed mild redness of the right eye with some eye discharge.  She denies any blurry vision or double vision.  No light sensitivity.  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 Authorizing Provider  albuterol (VENTOLIN HFA) 108 (90 Base) MCG/ACT inhaler Inhale 2 puffs into the lungs every 4 (four) hours as needed for wheezing or shortness of breath (or 15 minutes prior to exercise). 10/02/20   Clemon Chambers, MD  Ferrous Sulfate (IRON PO) Take by mouth.    [provider]  fluticasone (FLONASE) 50 MCG/ACT nasal spray Place 1 spray into both  nostrils daily for 3 days. 03/10/21 03/13/21  Teodora Medici, FNP    Family History Family History  Problem Relation Age of Onset   Diabetes Mother    Hypertension Mother    Multiple sclerosis Mother    Heart disease Father    Stroke Father    Diabetes Maternal Grandmother     Social History Social History   Tobacco Use   Smoking status: Never   Smokeless tobacco: Never  Vaping Use   Vaping Use: Never used  Substance Use Topics   Alcohol use: Not Currently   Drug use: No     Allergies   Patient has no known allergies.   Review of Systems Review of Systems  HENT:  Positive for congestion. Negative for ear discharge and ear pain.   Eyes:  Positive for discharge and redness. Negative for photophobia, pain, itching and visual disturbance.  Respiratory:  Positive for cough. Negative for shortness of breath and wheezing.   Cardiovascular:  Negative for chest pain.     Physical Exam Triage Vital Signs ED Triage Vitals  Enc Vitals Group     BP 12/22/21 0827 (!) 126/96     Pulse Rate  12/22/21 0827 87     Resp 12/22/21 0827 19     Temp 12/22/21 0827 98.4 F (36.9 C)     Temp Source 12/22/21 0827 Oral     SpO2 12/22/21 0827 100 %     Weight --      Height --      Head Circumference --      Peak Flow --      Pain Score 12/22/21 0824 4     Pain Loc --      Pain Edu? --      Excl. in [place]? --    No data found.  Updated Vital Signs BP (!) 126/96 (BP Location: Left Arm)   Pulse 87   Temp 98.4 F (36.9 C) (Oral)   Resp 19   SpO2 100%   Visual Acuity Right Eye Distance:   Left Eye Distance:   Bilateral Distance:    Right Eye Near:   Left Eye Near:    Bilateral Near:     Physical Exam Vitals and nursing note reviewed.  Constitutional:      General: She is not in acute distress.    Appearance: She is not ill-appearing.  HENT:     Right Ear: Tympanic membrane normal.     Left Ear: Tympanic membrane normal.     Mouth/Throat:     Mouth: Mucous membranes  are moist.     Pharynx: No posterior oropharyngeal erythema.  Eyes:     Extraocular Movements: Extraocular movements intact.     Pupils: Pupils are equal, round, and reactive to light.  Cardiovascular:     Rate and Rhythm: Normal rate and regular rhythm.     Pulses: Normal pulses.     Heart sounds: Normal heart sounds.  Pulmonary:     Effort: Pulmonary effort is normal.     Breath sounds: Normal breath sounds.  Abdominal:     General: Bowel sounds are normal.     Palpations: Abdomen is soft.  Neurological:     Mental Status: She is alert.      UC Treatments / Results  Labs (all labs ordered are listed, but only abnormal results are displayed) Labs Reviewed - No data to display  EKG   Radiology No results found.  Procedures Procedures (including critical care time)  Medications Ordered in UC Medications - No data to display  Initial Impression / Assessment and Plan / UC Course  I have reviewed the triage vital signs and the nursing notes.  Pertinent labs & imaging results that were available during my care of the patient were reviewed by me and considered in my medical decision making (see chart for details).     1.  Viral URI with cough: Patient is advised to continue supportive care Erythromycin eye ointment was sent to the pharmacy for pickup if eye symptoms persist or worsens. This is likely a viral process.  Reassurance given to the patient. Return precautions given. Final Clinical Impressions(s) / UC Diagnoses   Final diagnoses:  Viral URI with cough     Discharge Instructions      Please clean your eyes with a warm wet rag I will send erythromycin eye ointment to the pharmacy, give yourself a couple of days and if your symptoms worsen i.e. worsening eye discharge please pick up the erythromycin eye ointment.  Otherwise I expect your symptoms to resolve. Tylenol/Motrin as needed for pain and/or fever Tessalon Perles as needed for cough Return to  urgent care  if symptoms persist or worsens.   ED Prescriptions   None    PDMP not reviewed this encounter.   Chase Picket, MD 12/22/21 2000

## 2022-10-21 ENCOUNTER — Other Ambulatory Visit: Payer: Self-pay

## 2022-10-21 ENCOUNTER — Ambulatory Visit (HOSPITAL_COMMUNITY)
Admission: EM | Admit: 2022-10-21 | Discharge: 2022-10-21 | Disposition: A | Discharge status: Home / Self Care | Payer: Self-pay

## 2022-10-21 ENCOUNTER — Encounter (HOSPITAL_COMMUNITY): Payer: Self-pay | Admitting: Emergency Medicine

## 2022-10-21 DIAGNOSIS — R21 Rash and other nonspecific skin eruption: Secondary | ICD-10-CM

## 2022-10-21 NOTE — Discharge Instructions (Addendum)
On exam able to visualize the months that you have concerned about however the rash is currently so mild that it is hard to determine if it is consistent with hand-foot-and-mouth, please monitor closely at home and if rash becomes more prominent, spreads then move forward with treatment as it is hand-foot-and-mouth  Hand-foot-and-mouth is a viral rash meaning it must go away on its own, there is not medicine that may be given to you to take away  This rash is very contagious therefore you will need to stay home while it is present  Please wipe down all high surface areas in your home to help minimize spread  Treatment focuses on soothing the area where rash is present to keep you as comfortable as possible  For itching you may use allergy medicine such as Claritin Zyrtec or Benadryl  For itching you may use topical products such as Benadryl cream, calamine lotion or hydrocortisone, you may also take oatmeal baths  Keep skin moisturized with Vaseline or similar products as well as solution  For pain you may take Tylenol or Motrin as needed  If blisters occur in the mouth  Avoid products based with citrus or alcohol as these can cause irritation  May use cool to warm for preference  You may follow-up with urgent care as needed

## 2022-10-21 NOTE — ED Provider Notes (Signed)
[facility]    CSN: [id number] Arrival date & time: 10/21/22  0920      History   Chief Complaint Chief Complaint  Patient presents with   Rash    HPI Janice White is a 32 y.o. female.   Patient presents for evaluation of rash to the bilateral hands beginning 1 day ago.  Rash is pruritic.  Has not attempted treatment.  Known exposure to hand-foot-and-mouth by neighborhood touch door handles on home.  Concerned as she also has a younger child at home.  Denies drainage, pain fevers or URI symptoms.  Denies change in toiletries, diet or recent travel.    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 Authorizing Provider  albuterol (VENTOLIN HFA) 108 (90 Base) MCG/ACT inhaler Inhale 2 puffs into the lungs every 4 (four) hours as needed for wheezing or shortness of breath (or 15 minutes prior to exercise). Patient not taking: Reported on 10/21/2022 10/02/20   Verlee Monte, MD  benzonatate (TESSALON) 100 MG capsule Take 1 capsule (100 mg total) by mouth 3 (three) times daily as needed for cough. Patient not taking: Reported on 10/21/2022 12/22/21   Merrilee Jansky, MD  Ferrous Sulfate (IRON PO) Take by mouth. Patient not taking: Reported on 10/21/2022     [provider]  fluticasone (FLONASE) 50 MCG/ACT nasal spray Place 1 spray into both nostrils daily for 3 days. Patient not taking: Reported on 10/21/2022 03/10/21 03/13/21  Gustavus Bryant, FNP    Family History Family History  Problem Relation Age of Onset   Diabetes Mother    Hypertension Mother    Multiple sclerosis Mother    Heart disease Father    Stroke Father    Diabetes Maternal Grandmother     Social History Social History   Tobacco Use   Smoking status: Never   Smokeless tobacco: Never  Vaping Use   Vaping status: Never Used  Substance Use Topics   Alcohol use: Yes   Drug use: No     Allergies   Patient has no known allergies.   Review of Systems Review of Systems  Skin:  Positive for rash.     Physical Exam Triage Vital Signs ED Triage Vitals  Encounter Vitals Group     BP 10/21/22 0948 115/77     Systolic BP Percentile --      Diastolic BP Percentile --      Pulse Rate 10/21/22 0948 82     Resp  10/21/22 0948 16     Temp 10/21/22 0948 98.6 F (37 C)     Temp Source 10/21/22 0948 Oral     SpO2 10/21/22 0948 98 %     Weight --      Height --      Head Circumference --      Peak Flow --      Pain Score 10/21/22 0946 0     Pain Loc --      Pain Education --      Exclude from Growth Chart --    No data found.  Updated Vital Signs BP 115/77 (BP Location: Right Arm)   Pulse 82   Temp 98.6 F (37 C) (Oral)   Resp 16   LMP 10/14/2022 (Approximate)   SpO2 98%   Visual Acuity Right Eye Distance:   Left Eye Distance:   Bilateral Distance:    Right Eye Near:   Left Eye Near:    Bilateral Near:     Physical Exam Constitutional:      Appearance: Normal appearance.  Eyes:     Extraocular Movements: Extraocular movements intact.  Pulmonary:     Effort: Pulmonary effort is normal.  Skin:    Comments: Scant fine flesh tone papular bumps along the dorsum aspect of the bilateral hands  Neurological:     Mental Status: She is  alert and oriented to person, place, and time. Mental status is at baseline.      UC Treatments / Results  Labs (all labs ordered are listed, but only abnormal results are displayed) Labs Reviewed - No data to display  EKG   Radiology No results found.  Procedures Procedures (including critical care time)  Medications Ordered in UC Medications - No data to display  Initial Impression / Assessment and Plan / UC Course  I have reviewed the triage vital signs and the nursing notes.  Pertinent labs & imaging results that were available during my care of the patient were reviewed by me and considered in my medical decision making (see chart for details).  Rash  Scant rash present to the hands, very mild, at this time hard to determine if presentation is consistent with hand-foot-and-mouth, discussed with patient, advised to monitor closely and if symptoms persist or worsen to move forward with treatment as such, given written handout and discussed blister care and advised to follow-up if any concerns Final Clinical Impressions(s) / UC Diagnoses   Final diagnoses:  None   Discharge Instructions   None    ED Prescriptions   None    PDMP not reviewed this encounter.   Valinda Hoar, NP 10/21/22 1048

## 2022-10-21 NOTE — ED Triage Notes (Signed)
Neighbor has "hand , foot, mouth"  patient noticed her hands itching and small bumps on hands.  Patient is concerned if she has this now and she herself has small children at home  Has not taken or applied any medications to hands

## 2023-09-14 ENCOUNTER — Encounter (HOSPITAL_COMMUNITY): Payer: Self-pay | Admitting: Obstetrics and Gynecology

## 2023-09-14 ENCOUNTER — Inpatient Hospital Stay (HOSPITAL_COMMUNITY)
Admission: AD | Admit: 2023-09-14 | Discharge: 2023-09-15 | Disposition: A | Discharge status: Home / Self Care | Payer: MEDICAID | Attending: Obstetrics and Gynecology | Admitting: Obstetrics and Gynecology

## 2023-09-14 DIAGNOSIS — N760 Acute vaginitis: Secondary | ICD-10-CM

## 2023-09-14 DIAGNOSIS — O3680X0 Pregnancy with inconclusive fetal viability, not applicable or unspecified: Secondary | ICD-10-CM | POA: Insufficient documentation

## 2023-09-14 DIAGNOSIS — Z3A01 Less than 8 weeks gestation of pregnancy: Secondary | ICD-10-CM | POA: Insufficient documentation

## 2023-09-14 DIAGNOSIS — O23591 Infection of other part of genital tract in pregnancy, first trimester: Secondary | ICD-10-CM | POA: Insufficient documentation

## 2023-09-14 DIAGNOSIS — B9689 Other specified bacterial agents as the cause of diseases classified elsewhere: Secondary | ICD-10-CM | POA: Insufficient documentation

## 2023-09-14 LAB — POCT PREGNANCY, URINE: Preg Test, Ur: POSITIVE — AB

## 2023-09-14 NOTE — MAU Note (Signed)
 Janice White is a 33 y.o. at [redacted]w[redacted]d here in MAU reporting mild cramping and nausea. Came in to find out how far along she is. Denies VB.   LMP: 08/17/23 Onset of complaint: yesterday Pain score: 6 Vitals:   09/14/23 2350 09/14/23 2353  BP:  (!) 141/81  Pulse: 87   Resp: 17   Temp: 99.1 F (37.3 C)   SpO2: 100%      FHT: n/a  Lab orders placed from triage: u/a

## 2023-09-15 ENCOUNTER — Inpatient Hospital Stay (HOSPITAL_COMMUNITY): Payer: MEDICAID

## 2023-09-15 DIAGNOSIS — B9689 Other specified bacterial agents as the cause of diseases classified elsewhere: Secondary | ICD-10-CM

## 2023-09-15 DIAGNOSIS — N760 Acute vaginitis: Secondary | ICD-10-CM

## 2023-09-15 DIAGNOSIS — Z3A01 Less than 8 weeks gestation of pregnancy: Secondary | ICD-10-CM

## 2023-09-15 LAB — COMPREHENSIVE METABOLIC PANEL WITH GFR
ALT: 14 U/L (ref 0–44)
AST: 19 U/L (ref 15–41)
Albumin: 4.1 g/dL (ref 3.5–5.0)
Alkaline Phosphatase: 52 U/L (ref 38–126)
Anion gap: 7 (ref 5–15)
BUN: 11 mg/dL (ref 6–20)
CO2: 21 mmol/L — ABNORMAL LOW (ref 22–32)
Calcium: 9.3 mg/dL (ref 8.9–10.3)
Chloride: 108 mmol/L (ref 98–111)
Creatinine, Ser: 0.92 mg/dL (ref 0.44–1.00)
GFR, Estimated: >60 mL/min (ref >60)
Glucose, Bld: 116 mg/dL — ABNORMAL HIGH (ref 70–99)
Potassium: 3.9 mmol/L (ref 3.5–5.1)
Sodium: 136 mmol/L (ref 135–145)
Total Bilirubin: 0.4 mg/dL (ref 0.0–1.2)
Total Protein: 6.9 g/dL (ref 6.5–8.1)

## 2023-09-15 LAB — GC/CHLAMYDIA PROBE AMP (~~LOC~~) NOT AT ARMC
Chlamydia: NEGATIVE
Comment: NEGATIVE
Comment: NORMAL
Neisseria Gonorrhea: NEGATIVE

## 2023-09-15 LAB — CBC
HCT: 35.7 % — ABNORMAL LOW (ref 36.0–46.0)
Hemoglobin: 11.7 g/dL — ABNORMAL LOW (ref 12.0–15.0)
MCH: 27.3 pg (ref 26.0–34.0)
MCHC: 32.8 g/dL (ref 30.0–36.0)
MCV: 83.2 fL (ref 80.0–100.0)
Platelets: 305 K/uL (ref 150–400)
RBC: 4.29 MIL/uL (ref 3.87–5.11)
RDW: 13.4 % (ref 11.5–15.5)
WBC: 11.6 K/uL — ABNORMAL HIGH (ref 4.0–10.5)
nRBC: 0 % (ref 0.0–0.2)

## 2023-09-15 LAB — URINALYSIS, ROUTINE W REFLEX MICROSCOPIC
Bilirubin Urine: NEGATIVE
Glucose, UA: NEGATIVE mg/dL
Hgb urine dipstick: NEGATIVE
Ketones, ur: NEGATIVE mg/dL
Nitrite: NEGATIVE
Protein, ur: 30 mg/dL — AB
Specific Gravity, Urine: 1.026 (ref 1.005–1.030)
pH: 6 (ref 5.0–8.0)

## 2023-09-15 LAB — WET PREP, GENITAL
Sperm: NONE SEEN
Trich, Wet Prep: NONE SEEN
WBC, Wet Prep HPF POC: <10 (ref <10)
Yeast Wet Prep HPF POC: NONE SEEN

## 2023-09-15 LAB — HCG, QUANTITATIVE, PREGNANCY: hCG, Beta Chain, Quant, S: 317 m[IU]/mL — ABNORMAL HIGH (ref <5)

## 2023-09-15 MED ORDER — METRONIDAZOLE 500 MG PO TABS: 500.0000 mg | ORAL_TABLET | Freq: Two times a day (BID) | ORAL | 0 refills | Status: DC

## 2023-09-15 MED ORDER — PREPLUS 27-1 MG PO TABS: 1.0000 | ORAL_TABLET | Freq: Every day | ORAL | 13 refills | Status: AC

## 2023-09-15 NOTE — MAU Provider Note (Signed)
 History     CSN: [id number]  Arrival date and time: 09/14/23 2251   Event Date/Time   First Provider Initiated Contact with Patient 09/14/2023 11:53 PM   Chief Complaint  Patient presents with   Abdominal Pain    HPI  Janice White is a 33 y.o. G2P1001 at [redacted]w[redacted]d who presents to the MAU for cramping in setting of early pregnancy. Reports period was 2 days late so she took two home UPTs which were both positive. She was in disbelief so wanted confirmation. Reports mild cramping and nausea. No VB.  Past Medical History:  Diagnosis Date   Asthma    PONV (postoperative nausea and vomiting)     Past Surgical History:  Procedure Laterality Date   WISDOM TOOTH EXTRACTION      Family History  Problem Relation Age of Onset   Diabetes Mother    Hypertension Mother    Multiple sclerosis Mother    Heart disease Father    Stroke Father    Diabetes Maternal Grandmother     Social History   Tobacco Use   Smoking status: Never   Smokeless tobacco: Never  Vaping Use   Vaping status: Never Used  Substance Use Topics   Alcohol use: Yes   Drug use: No    Allergies: No Known Allergies  No medications prior to admission.    ROS reviewed and pertinent positives and negatives as documented in HPI.  Physical Exam   Blood pressure 120/78, pulse 96, temperature 99.1 F (37.3 C), resp. rate 17, height 5' (1.524 m), weight 67.6 kg, last menstrual period 08/17/2023, SpO2 100%.  Physical Exam Constitutional:      General: She is not in acute distress.    Appearance: Normal appearance. She is not ill-appearing.  HENT:     Head: Normocephalic and atraumatic.  Cardiovascular:     Rate and Rhythm: Normal rate.  Pulmonary:     Effort: Pulmonary effort is normal.     Breath sounds: Normal breath sounds.  Abdominal:     Palpations: Abdomen is soft.     Tenderness: There is no abdominal tenderness. There is no guarding.  Musculoskeletal:        General: Normal range of motion.   Skin:    General: Skin is warm and dry.     Findings: No rash.  Neurological:     General: No focal deficit present.     Mental Status: She is alert and oriented to person, place, and time.     MAU Course  Procedures  MDM 33 y.o. G2P1001 at [redacted]w[redacted]d presenting for mild cramping in setting of early pregnancy. She is well appearing with no abdominal tenderness on exam. Discussed results in detail with both patient and her mother -- Rh positive, hCG 317, CBC/CMP ok. Wet prep w + BV. U/S with no IUP visualized. Discussed w pt that at this juncture, she has a pregnancy of unknown location, so we need to repeat hCG in 48-72 hours and would expect doubling of hCG if this is an IUP. Appt made for pt to return to MAU for repeat lab work to ensure there is no discrepancy b/w labs and also as she has not established prenatal care yet. Plans to establish at [facility]. Ectopic precautions given. Discharged in stable condition.  Assessment and Plan     ICD-10-CM   1. Bacterial vaginosis  N76.0    B96.89     2. Pregnancy, location unknown  O36.80X0  Rx for Flagyl  sent Return lab visit scheduled for 8/1 Ectopic precautions given   [name], MD OB Fellow, Faculty Practice [facility], Center for [facility]  09/15/2023, 3:49 AM

## 2023-09-15 NOTE — Progress Notes (Signed)
WRitten and verbal d/c instructions given and understanding voiced.  

## 2023-09-16 ENCOUNTER — Inpatient Hospital Stay (HOSPITAL_COMMUNITY): Payer: MEDICAID

## 2023-09-16 ENCOUNTER — Inpatient Hospital Stay (HOSPITAL_COMMUNITY)
Admission: AD | Admit: 2023-09-16 | Discharge: 2023-09-16 | Disposition: A | Discharge status: Home / Self Care | Payer: MEDICAID | Attending: Obstetrics & Gynecology | Admitting: Obstetrics & Gynecology

## 2023-09-16 ENCOUNTER — Other Ambulatory Visit: Payer: Self-pay

## 2023-09-16 DIAGNOSIS — O26891 Other specified pregnancy related conditions, first trimester: Secondary | ICD-10-CM

## 2023-09-16 DIAGNOSIS — O26899 Other specified pregnancy related conditions, unspecified trimester: Secondary | ICD-10-CM

## 2023-09-16 DIAGNOSIS — Z3A01 Less than 8 weeks gestation of pregnancy: Secondary | ICD-10-CM

## 2023-09-16 DIAGNOSIS — O23591 Infection of other part of genital tract in pregnancy, first trimester: Secondary | ICD-10-CM | POA: Insufficient documentation

## 2023-09-16 DIAGNOSIS — R109 Unspecified abdominal pain: Secondary | ICD-10-CM

## 2023-09-16 DIAGNOSIS — B9689 Other specified bacterial agents as the cause of diseases classified elsewhere: Secondary | ICD-10-CM | POA: Insufficient documentation

## 2023-09-16 DIAGNOSIS — R1032 Left lower quadrant pain: Secondary | ICD-10-CM | POA: Insufficient documentation

## 2023-09-16 LAB — CBC
HCT: 36.8 % (ref 36.0–46.0)
Hemoglobin: 12.2 g/dL (ref 12.0–15.0)
MCH: 27.8 pg (ref 26.0–34.0)
MCHC: 33.2 g/dL (ref 30.0–36.0)
MCV: 83.8 fL (ref 80.0–100.0)
Platelets: 324 K/uL (ref 150–400)
RBC: 4.39 MIL/uL (ref 3.87–5.11)
RDW: 13.3 % (ref 11.5–15.5)
WBC: 8.1 K/uL (ref 4.0–10.5)
nRBC: 0 % (ref 0.0–0.2)

## 2023-09-16 LAB — HCG, QUANTITATIVE, PREGNANCY: hCG, Beta Chain, Quant, S: 650 m[IU]/mL — ABNORMAL HIGH (ref <5)

## 2023-09-16 NOTE — MAU Note (Signed)
 Janice White is a 33 y.o. at [redacted]w[redacted]d here in MAU reporting: cramping starting yesterday but worse 2300. Pt denies bleeding, HA, chest pain.   LMP: 08/17/23 Onset of complaint: 2300 7/30 Pain score: 7/10 Vitals:   09/16/23 0932  BP: 121/77  Pulse: (!) 107  Resp: 16  Temp: 99.2 F (37.3 C)  SpO2: 100%     FHT: na Lab orders placed from triage: ua

## 2023-09-16 NOTE — MAU Provider Note (Signed)
 History     CSN: [id number]  Arrival date and time: 09/16/23 9078   None     Chief Complaint  Patient presents with   Abdominal Pain   HPI Patient is a 33 year old G2, P1 at 4 weeks and 2 days presenting to the MAU for worsening abdominal pain.  Was seen yesterday in the MAU for similar complaint and given return precautions.  hCG yesterday was 317.  She was diagnosed with BV.  Ultrasound showing no IUP and repeat hCG in 48 hours was recommended.  Patient reports that over the last 12 hours her left lower quadrant abdominal pain has gotten considerably worse as well as her cramping.  Patient is very anxious and wants confirmation that this pregnancy is going to be okay.   OB History     Gravida  2   Para  1   Term  1   Preterm  0   AB  0   Living  1      SAB  0   IAB  0   Ectopic  0   Multiple  0   Live Births  1           Past Medical History:  Diagnosis Date   Asthma    PONV (postoperative nausea and vomiting)     Past Surgical History:  Procedure Laterality Date   WISDOM TOOTH EXTRACTION      Family History  Problem Relation Age of Onset   Diabetes Mother    Hypertension Mother    Multiple sclerosis Mother    Heart disease Father    Stroke Father    Diabetes Maternal Grandmother     Social History   Tobacco Use   Smoking status: Never   Smokeless tobacco: Never  Vaping Use   Vaping status: Never Used  Substance Use Topics   Alcohol use: Yes   Drug use: No    Allergies: No Known Allergies  Medications Prior to Admission  Medication Sig Dispense Refill Last Dose/Taking   metroNIDAZOLE  (FLAGYL ) 500 MG tablet Take 1 tablet (500 mg total) by mouth 2 (two) times daily. 14 tablet 0    Prenatal Vit-Fe Fumarate-FA (PREPLUS) 27-1 MG TABS Take 1 tablet by mouth daily. 30 tablet 13     Review of Systems  Gastrointestinal:  Positive for abdominal pain.  Genitourinary:  Positive for vaginal discharge. Negative for vaginal bleeding.  All  other systems reviewed and are negative.  Physical Exam   Blood pressure 121/77, pulse (!) 107, temperature 99.2 F (37.3 C), temperature source Oral, resp. rate 16, height 5' (1.524 m), weight 66.9 kg, last menstrual period 08/17/2023, SpO2 100%.  Physical Exam Vitals and nursing note reviewed.  Constitutional:      Appearance: Normal appearance.  HENT:     Head: Normocephalic and atraumatic.     Nose: No congestion or rhinorrhea.  Eyes:     Extraocular Movements: Extraocular movements intact.  Cardiovascular:     Rate and Rhythm: Normal rate.  Pulmonary:     Effort: Pulmonary effort is normal.  Abdominal:     Palpations: Abdomen is soft.     Tenderness: There is abdominal tenderness (Left lower quadrant tenderness without rebound).  Musculoskeletal:        General: Normal range of motion.     Cervical back: Normal range of motion.  Skin:    General: Skin is warm.     Capillary Refill: Capillary refill takes less than 2 seconds.  Neurological:  General: No focal deficit present.     Mental Status: She is alert.     Cranial Nerves: No cranial nerve deficit.  Psychiatric:        Mood and Affect: Mood normal.        Behavior: Behavior normal.     MAU Course  Procedures  MDM CBC Beta-hCG Ultrasound   Assessment and Plan  Janice White is a 33 yo G2P1 @ [redacted]w[redacted]d presenting for abdominal pain  Abdominal pain Worsening abdominal pain over the last 12 hours.  Seen yesterday and diagnosed with pregnancy of unknown location.  Was recommended repeat beta-hCG in 48 hours but given worsening abdominal pain repeat ultrasound ordered to assess for free fluid in the abdomen or adnexal mass.  CBC repeated and hCG also repeated.  Ultrasound showing trace free fluid which was present in the previous evaluation.  CBC was stable.  hCG increased appropriately from 317>650.  Discussed that this is the appropriate rise of the hCG and she does not need to return tomorrow for repeat lab  work.  She does need to viability scan in 14 days.  Message sent to clinic.  Discussed ectopic precautions at length and strict MAU return precautions and patient agreeable.  Patient discharged home.  Lashun Ramseyer V Audi Conover 09/16/2023, 9:54 AM

## 2023-09-16 NOTE — Discharge Instructions (Signed)
 It was a pleasure taking care of you today.  Your hCG rose appropriately so you do not need to come back tomorrow for more lab work.  If your pain gets considerably worse or you start bleeding heavily you need to return for evaluation for ectopic pregnancy.  I sent a message to our clinic to have you scheduled for viability ultrasound in 14 days.  I hope you have a wonderful rest of your day!

## 2023-09-17 ENCOUNTER — Other Ambulatory Visit (HOSPITAL_COMMUNITY): Admit: 2023-09-17 | Payer: Self-pay

## 2023-09-18 ENCOUNTER — Emergency Department (HOSPITAL_BASED_OUTPATIENT_CLINIC_OR_DEPARTMENT_OTHER)
Admission: EM | Admit: 2023-09-18 | Discharge: 2023-09-18 | Disposition: A | Discharge status: Home / Self Care | Payer: Self-pay

## 2023-09-18 ENCOUNTER — Encounter (HOSPITAL_BASED_OUTPATIENT_CLINIC_OR_DEPARTMENT_OTHER): Payer: Self-pay | Admitting: Emergency Medicine

## 2023-09-18 ENCOUNTER — Other Ambulatory Visit: Payer: Self-pay

## 2023-09-18 DIAGNOSIS — R1084 Generalized abdominal pain: Secondary | ICD-10-CM | POA: Insufficient documentation

## 2023-09-18 DIAGNOSIS — Z3A00 Weeks of gestation of pregnancy not specified: Secondary | ICD-10-CM | POA: Insufficient documentation

## 2023-09-18 DIAGNOSIS — O26891 Other specified pregnancy related conditions, first trimester: Secondary | ICD-10-CM | POA: Insufficient documentation

## 2023-09-18 LAB — CBC WITH DIFFERENTIAL/PLATELET
Abs Immature Granulocytes: 0.01 K/uL (ref 0.00–0.07)
Basophils Absolute: 0 K/uL (ref 0.0–0.1)
Basophils Relative: 0 %
Eosinophils Absolute: 0.1 K/uL (ref 0.0–0.5)
Eosinophils Relative: 1 %
HCT: 35.4 % — ABNORMAL LOW (ref 36.0–46.0)
Hemoglobin: 11.6 g/dL — ABNORMAL LOW (ref 12.0–15.0)
Immature Granulocytes: 0 %
Lymphocytes Relative: 27 %
Lymphs Abs: 2.2 K/uL (ref 0.7–4.0)
MCH: 27.7 pg (ref 26.0–34.0)
MCHC: 32.8 g/dL (ref 30.0–36.0)
MCV: 84.5 fL (ref 80.0–100.0)
Monocytes Absolute: 0.8 K/uL (ref 0.1–1.0)
Monocytes Relative: 10 %
Neutro Abs: 5 K/uL (ref 1.7–7.7)
Neutrophils Relative %: 62 %
Platelets: 301 K/uL (ref 150–400)
RBC: 4.19 MIL/uL (ref 3.87–5.11)
RDW: 13.5 % (ref 11.5–15.5)
WBC: 8.1 K/uL (ref 4.0–10.5)
nRBC: 0 % (ref 0.0–0.2)

## 2023-09-18 LAB — URINALYSIS, ROUTINE W REFLEX MICROSCOPIC
Bilirubin Urine: NEGATIVE
Glucose, UA: NEGATIVE mg/dL
Hgb urine dipstick: NEGATIVE
Ketones, ur: NEGATIVE mg/dL
Nitrite: NEGATIVE
Protein, ur: 30 mg/dL — AB
Specific Gravity, Urine: 1.02 (ref 1.005–1.030)
pH: 7.5 (ref 5.0–8.0)

## 2023-09-18 LAB — HCG, QUANTITATIVE, PREGNANCY: hCG, Beta Chain, Quant, S: 1328 m[IU]/mL — ABNORMAL HIGH (ref <5)

## 2023-09-18 LAB — URINALYSIS, MICROSCOPIC (REFLEX): RBC / HPF: NONE SEEN RBC/hpf (ref 0–5)

## 2023-09-18 LAB — BASIC METABOLIC PANEL WITH GFR
Anion gap: 11 (ref 5–15)
BUN: 8 mg/dL (ref 6–20)
CO2: 23 mmol/L (ref 22–32)
Calcium: 9.1 mg/dL (ref 8.9–10.3)
Chloride: 106 mmol/L (ref 98–111)
Creatinine, Ser: 0.74 mg/dL (ref 0.44–1.00)
GFR, Estimated: >60 mL/min (ref >60)
Glucose, Bld: 89 mg/dL (ref 70–99)
Potassium: 3.7 mmol/L (ref 3.5–5.1)
Sodium: 139 mmol/L (ref 135–145)

## 2023-09-18 NOTE — Discharge Instructions (Signed)
 As we discussed, I would like for you to find an OB/GYN.  You can return to the emergency department for any other worsening symptoms or red flag symptoms that we discussed.

## 2023-09-18 NOTE — ED Triage Notes (Signed)
 Pt c/o lower abd cramping x 2-3 d; she is approx [redacted] wk pregnant; denies vag bleeding

## 2023-09-18 NOTE — ED Provider Notes (Signed)
 [facility] Provider Note   CSN: [id number] Arrival date & time: 09/18/23  1721     Patient presents with: Abdominal Cramping   Janice White is a 33 y.o. female patient who presents to the emergency department today for further evaluation of lower abdominal cramping which has been present for the last few days.  Chart review does reveal the patient was at MAU the last couple of days for similar symptoms.  She had 2 ultrasounds which were both negative.  They did not see a gestational sac at that time.  Patient here for similar she states that her abdominal cramping is slightly worse.  She denies any vaginal bleeding, nausea, vomiting, diarrhea.    Abdominal Cramping       Prior to Admission medications   Medication Sig Start Date End Date Taking? Authorizing Provider  metroNIDAZOLE  (FLAGYL ) 500 MG tablet Take 1 tablet (500 mg total) by mouth 2 (two) times daily. 09/15/23   Kumar, Agnijita, MD  Prenatal Vit-Fe Fumarate-FA (PREPLUS) 27-1 MG TABS Take 1 tablet by mouth daily. 09/15/23   Kumar, Agnijita, MD    Allergies: Patient has no known allergies.    Review of Systems  All other systems reviewed and are negative.   Updated Vital Signs BP 117/69   Pulse 88   Temp 98.4 F (36.9 C)   Resp 16   Ht 5' (1.524 m)   Wt 66.6 kg   LMP 08/17/2023   SpO2 100%   BMI 28.68 kg/m   Physical Exam Vitals and nursing note reviewed.  Constitutional:      General: She is not in acute distress.    Appearance: Normal appearance.  HENT:     Head: Normocephalic and atraumatic.  Eyes:     General:        Right eye: No discharge.        Left eye: No discharge.  Cardiovascular:     Comments: Regular rate and rhythm.  S1/S2 are distinct without any evidence of murmur, rubs, or gallops.  Radial pulses are 2+ bilaterally.  Dorsalis pedis pulses are 2+ bilaterally.  No evidence of pedal edema. Pulmonary:     Comments: Clear to auscultation  bilaterally.  Normal effort.  No respiratory distress.  No evidence of wheezes, rales, or rhonchi heard throughout. Abdominal:     General: Abdomen is flat. Bowel sounds are normal. There is no distension.     Tenderness: There is generalized abdominal tenderness. There is no guarding or rebound.  Musculoskeletal:        General: Normal range of motion.     Cervical back: Neck supple.  Skin:    General: Skin is warm and dry.     Findings: No rash.  Neurological:     General: No focal deficit present.     Mental Status: She is alert.  Psychiatric:        Mood and Affect: Mood normal.        Behavior: Behavior normal.     (all labs ordered are listed, but only abnormal results are displayed) Labs Reviewed  HCG, QUANTITATIVE, PREGNANCY - Abnormal; Notable for the following components:      Result Value   hCG, Beta Chain, Quant, S 1,328 (*)    All other components within normal limits  URINALYSIS, ROUTINE W REFLEX MICROSCOPIC - Abnormal; Notable for the following components:   APPearance HAZY (*)    Protein, ur 30 (*)  Leukocytes,Ua TRACE (*)    All other components within normal limits  CBC WITH DIFFERENTIAL/PLATELET - Abnormal; Notable for the following components:   Hemoglobin 11.6 (*)    HCT 35.4 (*)    All other components within normal limits  URINALYSIS, MICROSCOPIC (REFLEX) - Abnormal; Notable for the following components:   Bacteria, UA FEW (*)    All other components within normal limits  BASIC METABOLIC PANEL WITH GFR    EKG: None  Radiology: No results found.  Procedures   Medications Ordered in the ED - No data to display  Clinical Course as of 09/18/23 2104  Sat Sep 18, 2023  1724 US  transvaginal 7/31: MPRESSION: No intrauterine gestational sac, yolk sac, fetal pole, or cardiac activity visualized. Differential considerations include intrauterine gestation too early to be sonographically visualized, spontaneous abortion, or ectopic pregnancy.  Consider follow-up ultrasound in 14 days and serial quantitative beta HCG follow-up.  [TY]  2058 We went over all labs with her at the bedside.  We discussed that her hCG is currently uptrending and is appropriate for gestational age.  We talked about having a repeat ultrasound in 2 weeks per MAU note as it is likely still too early to observe the fetus and gestational sac in the uterus.  We discussed the red flag symptoms and when to return to the emergency department and etiologies and symptoms of ectopic pregnancy.  All questions or concerns addressed.  Patient is mainly concerned about miscarriage.  Patient is not have any active vaginal bleeding at this time. [CF]  2059 Urinalysis, Routine w reflex microscopic -Urine, Clean Catch(!) Negative. [CF]  2059 CBC with Differential(!) Physiological anemia of pregnancy. [CF]  2059 Basic metabolic panel Negative. [CF]    Clinical Course User Index [CF] Theotis Cameron HERO, PA-C [TY] Neysa Caron PARAS, DO    Medical Decision Making Janice White is a 33 y.o. female patient who presents to the emergency department today for further evaluation of abdominal pain in pregnancy.  This is the third time the patient is been evaluated this week for similar symptoms.  Will plan to get basic labs and hCG quant to trend.  She has had 2 ultrasounds both of which there was no gestational sac observed but is likely too early.  She does have instructions to repeat the ultrasound in 14 days to further assess.  As highlighted in ED course, all the labs are reassuring.  hCG is uptrending and appropriate for gestational age.  She is having no vaginal bleeding think this is likely a threatened abortion.  Patient comfortable going home.  Strict turn precaution were discussed.  She is going to find OB/GYN.  She was encouraged to return for any worsening symptoms.  She is safe for discharge.   Amount and/or Complexity of Data Reviewed Labs: ordered. Decision-making details  documented in ED Course.    Final diagnoses:  Abdominal pain during pregnancy in first trimester    ED Discharge Orders     None          Theotis Cameron HERO, [place] 09/18/23 2104    Neysa Caron PARAS, DO 09/18/23 2148

## 2023-09-30 ENCOUNTER — Ambulatory Visit (INDEPENDENT_AMBULATORY_CARE_PROVIDER_SITE_OTHER): Payer: Self-pay

## 2023-09-30 ENCOUNTER — Other Ambulatory Visit: Payer: Self-pay | Admitting: General Practice

## 2023-09-30 ENCOUNTER — Other Ambulatory Visit: Payer: Self-pay

## 2023-09-30 DIAGNOSIS — O3680X0 Pregnancy with inconclusive fetal viability, not applicable or unspecified: Secondary | ICD-10-CM

## 2023-09-30 DIAGNOSIS — Z3491 Encounter for supervision of normal pregnancy, unspecified, first trimester: Secondary | ICD-10-CM

## 2023-09-30 DIAGNOSIS — Z3A01 Less than 8 weeks gestation of pregnancy: Secondary | ICD-10-CM

## 2023-10-01 ENCOUNTER — Encounter: Payer: Self-pay | Admitting: Obstetrics and Gynecology

## 2023-10-01 DIAGNOSIS — O3110X0 Continuing pregnancy after spontaneous abortion of one fetus or more, unspecified trimester, not applicable or unspecified: Secondary | ICD-10-CM | POA: Insufficient documentation

## 2023-10-21 ENCOUNTER — Telehealth: Payer: Self-pay | Admitting: Family Medicine

## 2023-10-21 ENCOUNTER — Encounter (HOSPITAL_COMMUNITY): Payer: Self-pay

## 2023-10-21 ENCOUNTER — Ambulatory Visit (HOSPITAL_COMMUNITY)
Admission: EM | Admit: 2023-10-21 | Discharge: 2023-10-21 | Disposition: A | Discharge status: Home / Self Care | Attending: Nurse Practitioner | Admitting: Nurse Practitioner

## 2023-10-21 DIAGNOSIS — U071 COVID-19: Secondary | ICD-10-CM | POA: Diagnosis not present

## 2023-10-21 DIAGNOSIS — Z3A09 9 weeks gestation of pregnancy: Secondary | ICD-10-CM

## 2023-10-21 LAB — POC SARS CORONAVIRUS 2 AG -  ED: SARS Coronavirus 2 Ag: POSITIVE — AB

## 2023-10-21 NOTE — Discharge Instructions (Addendum)
 You tested positive for COVID on a home test last night. Home tests are generally reliable, and a confirmatory test done here today was also positive. COVID is caused by a virus, so antibiotics are not helpful in treatment since they only work against bacterial infections.  The most important steps in your recovery are to stay well hydrated, get plenty of rest, and monitor your symptoms. Since you are pregnant, Tylenol  is the only medication recommended for pain or fever. There are also several over-the-counter medications that are safe to use in pregnancy to help with congestion, cough, or other mild symptoms. You have been given a handout with detailed guidance--please use this as a reference.  COVID-19 generally appears to be less severe now compared to earlier stages of the pandemic back in 2020. This is likely due to a combination of factors, including increased population immunity from vaccination and prior infections, as well as the evolution of the virus towards less virulent strains. While new variants continue to emerge, they generally cause milder illness, especially in individuals with prior immunity.  Because of this, the CDC has updated its isolation guidance for COVID-19 and states that a 5-day isolation period following a positive test result is no longer needed. Under the new guidelines, people will not need to isolate if they are fever-free for at least 24 hours without medication and if their symptoms are mild and improving. You may return to normal activities if your symptoms are overall improving and you have been without a fever for 24 hours without taking fever reducing medications like Tylenol .   Monitor your symptoms closely over the next several days. If your symptoms are not improving within a week, follow up with your primary care provider.  Go to the emergency department right away if you develop shortness of breath, chest pain, dizziness, fainting, confusion, severe weakness, or  if you are unable to keep fluids down, as these may be signs of more serious illness.

## 2023-10-21 NOTE — Telephone Encounter (Signed)
 patient called in with concerns of some bad news she just recieved she said she was told she was pregnant with Twins and she found out one of them is not there

## 2023-10-21 NOTE — ED Provider Notes (Signed)
 [facility]    CSN: [id number] Arrival date & time: 10/21/23  0854      History   Chief Complaint Chief Complaint  Patient presents with   Cough   Covid Positive    HPI Janice White is a 33 y.o. female.   Discussed the use of AI scribe software for clinical note transcription with the patient, who gave verbal consent to proceed.   9-week pregnant woman (G2P1) presents with multiple symptoms and a positive at-home COVID-19 test. Her chief complaints include cough, sneezing, sore throat, hoarseness, and body aches.  The patient reports that her symptoms started on 10/18/23. She also mentions feeling feverish with chills, though no temperature was recorded. The patient denies runny nose, wheezing, shortness of breath, nausea, vomiting, diarrhea, or headache. She has not taken any medication for her symptoms. The patient is uncertain if she has been around anyone who was sick recently.  Regarding her pregnancy, the patient had an ER visit last month for belly pain. An initial ultrasound showed no sac or fetal pole, which was attributed to early pregnancy. A follow-up ultrasound on August 14th confirmed a viable intrauterine pregnancy at 6 weeks and 3 days gestation, with one healthy baby. The patient is currently 9 weeks and 2 days pregnant, with an estimated delivery date of May 22, 2024  The following portions of the patient's history were reviewed and updated as appropriate: allergies, current medications, past family history, past medical history, past social history, past surgical history, and problem list.    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 Authorizing Provider  Prenatal Vit-Fe Fumarate-FA (PREPLUS) 27-1 MG TABS Take 1 tablet by mouth daily. 09/15/23  Yes Von Reasoner, MD    Family History Family History  Problem Relation Age of Onset   Diabetes Mother    Hypertension Mother    Multiple sclerosis Mother    Heart disease Father    Stroke Father    Diabetes Maternal Grandmother     Social History Social History   Tobacco Use   Smoking status: Never   Smokeless tobacco: Never  Vaping Use   Vaping status: Never Used  Substance Use Topics   Alcohol use: Yes   Drug use: No     Allergies   Patient has no known allergies.   Review of Systems Review of Systems  Constitutional:  Positive for chills and fever.  HENT:  Positive for congestion, rhinorrhea, sneezing, sore throat and voice change (hoarse).   Respiratory:  Positive for cough. Negative for shortness of breath and wheezing.   Gastrointestinal:  Positive  for nausea. Negative for diarrhea and vomiting.  Musculoskeletal:  Positive for myalgias.  Neurological:  Negative for headaches.  All other systems reviewed and are negative.    Physical Exam Triage Vital Signs ED Triage Vitals  Encounter Vitals Group     BP 10/21/23 0943 114/77     Girls Systolic BP Percentile --      Girls Diastolic BP Percentile --      Boys Systolic BP Percentile --      Boys Diastolic BP Percentile --      Pulse Rate 10/21/23 0943 77     Resp 10/21/23 0943 18     Temp 10/21/23 0943 98.9 F (37.2 C)     Temp Source 10/21/23 0943 Oral     SpO2 10/21/23 0943 98 %     Weight --      Height 10/21/23 0942 5' (1.524 m)     Head Circumference --      Peak Flow --      Pain  Score 10/21/23 0942 6     Pain Loc --      Pain Education --      Exclude from Growth Chart --    No data found.  Updated Vital Signs BP 114/77 (BP Location: Left Arm)   Pulse 77   Temp 98.9 F (37.2 C) (Oral)   Resp 18   Ht 5' (1.524 m)   LMP 08/17/2023   SpO2 98%   BMI 28.68 kg/m   Visual Acuity Right Eye Distance:   Left Eye Distance:   Bilateral Distance:    Right Eye Near:   Left Eye Near:    Bilateral Near:     Physical Exam Vitals reviewed.  Constitutional:      General: She is awake. She is not in acute distress.    Appearance: Normal appearance. She is well-developed. She is not ill-appearing, toxic-appearing or diaphoretic.  HENT:     Head: Normocephalic.     Right Ear: Tympanic membrane, ear canal and external ear normal. No drainage, swelling or tenderness. No middle ear effusion. Tympanic membrane is not erythematous.     Left Ear: Tympanic membrane, ear canal and external ear normal. No drainage, swelling or tenderness.  No middle ear effusion. Tympanic membrane is not erythematous.     Nose: Congestion present. No rhinorrhea.     Mouth/Throat:     Lips: Pink.     Mouth: Mucous membranes are moist.     Pharynx: No pharyngeal swelling, oropharyngeal exudate, posterior oropharyngeal erythema or uvula swelling.     Tonsils: No tonsillar exudate or tonsillar abscesses.  Eyes:     General: Vision grossly intact.     Conjunctiva/sclera: Conjunctivae normal.  Cardiovascular:     Rate and Rhythm: Normal rate.     Heart sounds: Normal heart sounds.  Pulmonary:     Effort: Pulmonary effort is normal. No tachypnea or respiratory distress.     Breath sounds: Normal breath sounds and air entry.  Musculoskeletal:        General: Normal range of motion.     Cervical back: Normal range of motion and neck supple.  Lymphadenopathy:     Cervical: No cervical adenopathy.  Skin:    General: Skin is warm and dry.  Neurological:     General: No focal deficit  present.     Mental Status: She is alert and oriented to person, place, and time.  Psychiatric:        Behavior:  Behavior is cooperative.      UC Treatments / Results  Labs (all labs ordered are listed, but only abnormal results are displayed) Labs Reviewed  POC SARS CORONAVIRUS 2 AG -  ED - Abnormal; Notable for the following components:      Result Value   SARS Coronavirus 2 Ag Positive (*)    All other components within normal limits    EKG   Radiology No results found.  Procedures Procedures (including critical care time)  Medications Ordered in UC Medications - No data to display  Initial Impression / Assessment and Plan / UC Course  I have reviewed the triage vital signs and the nursing notes.  Pertinent labs & imaging results that were available during my care of the patient were reviewed by me and considered in my medical decision making (see chart for details).       The patient presents with symptoms of an acute respiratory illness and tested positive for COVID-19 last night with a home COVID test.  The patient should maintain hydration and get adequate rest. Education was provided regarding the current understanding of COVID-19, which has generally become less severe compared to earlier stages of the pandemic due to increased immunity and viral evolution. The patient was counseled on updated CDC guidance, which no longer requires a strict five-day isolation period. Instead, return to normal activities is appropriate once symptoms are improving overall and the patient has been fever-free for at least 24 hours without fever-reducing medications. Close monitoring of symptoms over the next several days was advised, with follow-up with primary care if symptoms are not improving within a week.  Medication safe in pregnancy handout provided.  The patient should seek emergency care for shortness of breath, chest pain, dizziness, fainting, confusion, severe weakness, or  inability to keep fluids down.  Today's evaluation has revealed no signs of a dangerous process. Discussed diagnosis with patient and/or guardian. Patient and/or guardian aware of their diagnosis, possible red flag symptoms to watch out for and need for close follow up. Patient and/or guardian understands verbal and written discharge instructions. Patient and/or guardian comfortable with plan and disposition.  Patient and/or guardian has a clear mental status at this time, good insight into illness (after discussion and teaching) and has clear judgment to make decisions regarding their care  Documentation was completed with the aid of voice recognition software. Transcription may contain typographical errors. Final Clinical Impressions(s) / UC Diagnoses   Final diagnoses:  COVID-19  [redacted] weeks gestation of pregnancy     Discharge Instructions      You tested positive for COVID on a home test last night. Home tests are generally reliable, and a confirmatory test done here today was also positive. COVID is caused by a virus, so antibiotics are not helpful in treatment since they only work against bacterial infections.  The most important steps in your recovery are to stay well hydrated, get plenty of rest, and monitor your symptoms. Since you are pregnant, Tylenol  is the only medication recommended for pain or fever. There are also several over-the-counter medications that are safe to use in pregnancy to help with congestion, cough, or other mild symptoms. You have been given a handout with detailed guidance--please use this as a reference.  COVID-19 generally appears to be less severe now compared to earlier stages of the pandemic back in 2020. This is likely due to a combination of factors, including increased population immunity from vaccination and prior infections, as well as  the evolution of the virus towards less virulent strains. While new variants continue to emerge, they generally cause  milder illness, especially in individuals with prior immunity.  Because of this, the CDC has updated its isolation guidance for COVID-19 and states that a 5-day isolation period following a positive test result is no longer needed. Under the new guidelines, people will not need to isolate if they are fever-free for at least 24 hours without medication and if their symptoms are mild and improving. You may return to normal activities if your symptoms are overall improving and you have been without a fever for 24 hours without taking fever reducing medications like Tylenol .   Monitor your symptoms closely over the next several days. If your symptoms are not improving within a week, follow up with your primary care provider.  Go to the emergency department right away if you develop shortness of breath, chest pain, dizziness, fainting, confusion, severe weakness, or if you are unable to keep fluids down, as these may be signs of more serious illness.     ED Prescriptions   None    PDMP not reviewed this encounter.   Iola Lukes, [place] 10/21/23 1055

## 2023-10-21 NOTE — ED Triage Notes (Signed)
 Onset this past Monday with body aches, cough, sneezing, congestion, and scratchy throat. Onset this morning with voice loss.   Patient took a home COVID test yesterday that was positive. States she is currently [redacted] weeks pregnant.

## 2023-10-26 ENCOUNTER — Telehealth: Admitting: Physician Assistant

## 2023-10-26 NOTE — Progress Notes (Signed)
 Patient was evaluated at in-person Urgent Care last night. Was unable to cancel appointment before appt time. Will close out encounter and mark a no-charge.

## 2023-10-27 ENCOUNTER — Telehealth (INDEPENDENT_AMBULATORY_CARE_PROVIDER_SITE_OTHER)

## 2023-10-27 DIAGNOSIS — Z3490 Encounter for supervision of normal pregnancy, unspecified, unspecified trimester: Secondary | ICD-10-CM | POA: Insufficient documentation

## 2023-10-27 DIAGNOSIS — Z3A10 10 weeks gestation of pregnancy: Secondary | ICD-10-CM

## 2023-10-27 DIAGNOSIS — Z3491 Encounter for supervision of normal pregnancy, unspecified, first trimester: Secondary | ICD-10-CM | POA: Diagnosis not present

## 2023-10-27 DIAGNOSIS — O0990 Supervision of high risk pregnancy, unspecified, unspecified trimester: Secondary | ICD-10-CM | POA: Insufficient documentation

## 2023-10-27 MED ORDER — BLOOD PRESSURE CUFF MISC: 1.0000 | 0 refills | Status: DC | PRN

## 2023-10-27 MED ORDER — GOJJI WEIGHT SCALE MISC: 1.0000 | Freq: Once | 0 refills | Status: AC

## 2023-10-27 MED ORDER — PROMETHAZINE HCL 25 MG PO TABS: 25.0000 mg | ORAL_TABLET | Freq: Four times a day (QID) | ORAL | 1 refills | Status: DC | PRN

## 2023-10-27 NOTE — Patient Instructions (Addendum)
 Go to [facility] to pick up your blood pressure cuff and weight scale at  [address], [place], [place] [address] [phone]    Considering [place]? Guide for patients at Center for [facility]) Why consider waterbirth? Gentle birth for babies  Less pain medicine used in labor  May allow for passive descent/less pushing  May reduce perineal tears  More mobility and instinctive maternal position changes  Increased maternal relaxation   Is waterbirth safe? What are the risks of infection, drowning or other complications? Infection:  Very low risk (3.7 % for tub vs 4.8% for bed)  7 in 8000 waterbirths with documented infection  Poorly cleaned equipment most common cause  Slightly lower group B strep transmission rate  Drowning  Maternal:  Very low risk  Related to seizures or fainting  Newborn:  Very low risk. No evidence of increased risk of respiratory problems in multiple large studies  Physiological protection from breathing under water  Avoid underwater birth if there are any fetal complications  Once baby's head is out of the water, keep it out.  Birth complication  Some reports of cord trauma, but risk decreased by bringing baby to surface gradually  No evidence of increased risk of shoulder dystocia. Mothers can usually change positions faster in water than in a bed, possibly aiding the maneuvers to free the shoulder.   There are 2 things you MUST do to have a waterbirth with [facility]: Attend a waterbirth class at [facility] at [facility]   3rd Wednesday of every month from 7-9 pm (virtual during COVID) [employer] at [email or web address] or [email or web address] or by calling [phone] Bring us  the certificate from the class to your prenatal appointment or send via MyChart Meet with a midwife at 36 weeks* to see if you can still plan a waterbirth and to sign the consent.   *We also recommend that you schedule as  many of your prenatal visits with a midwife as possible.    Helpful information: You may want to bring a bathing suit top to the hospital to wear during labor but this is optional.  All other supplies are provided by the hospital. Please arrive at the hospital with signs of active labor, and do not wait at home until late in labor. It takes 45 min- 1 hour for fetal monitoring, and check in to your room to take place, plus transport and filling of the waterbirth tub.    Things that would prevent you from having a waterbirth: Premature, <37wks  Previous cesarean birth  Presence of thick meconium-stained fluid  Multiple gestation (Twins, triplets, etc.)  Uncontrolled diabetes or gestational diabetes requiring medication  Hypertension diagnosed in pregnancy or preexisting hypertension (gestational hypertension, preeclampsia, or chronic hypertension) Fetal growth restriction (your baby measures less than 10th percentile on ultrasound) Heavy vaginal bleeding  Non-reassuring fetal heart rate  Active infection (MRSA, etc.). Group B Strep is NOT a contraindication for waterbirth.  If your labor has to be induced and induction method requires continuous monitoring of the baby's heart rate  Other risks/issues identified by your obstetrical provider   Please remember that birth is unpredictable. Under certain unforeseeable circumstances your provider may advise against giving birth in the tub. These decisions will be made on a case-by-case basis and with the safety of you and your baby as our highest priority.    Updated 05/21/21   Safe Medications in Pregnancy   Acne:  Benzoyl Peroxide  Salicylic Acid  Backache/Headache:  Tylenol : 2 regular strength every 4 hours OR               2 Extra strength every 6 hours   Colds/Coughs/Allergies:  Benadryl  (alcohol free) 25 mg every 6 hours as needed  Breath right strips  Claritin   Cepacol throat lozenges  Chloraseptic throat spray  Cold-Eeze- up  to three times per day  Cough drops, alcohol free  Flonase  (by prescription only)  Guaifenesin  Mucinex  Robitussin DM (plain only, alcohol free)  Saline nasal spray/drops  Sudafed (pseudoephedrine ) & Actifed * use only after [redacted] weeks gestation and if you do not have high blood pressure  Tylenol   Vicks Vaporub  Zinc lozenges  Zyrtec    Constipation:  Colace  Ducolax suppositories  Fleet enema  Glycerin  suppositories  Metamucil  Milk of magnesia  Miralax  Senokot  Smooth move tea   Diarrhea:  Kaopectate  Imodium A-D   *NO pepto Bismol   Hemorrhoids:  Anusol  Anusol HC  Preparation H  Tucks   Indigestion:  Tums  Maalox  Mylanta  Zantac  Pepcid    Insomnia:  Benadryl  (alcohol free) 25mg  every 6 hours as needed  Tylenol  PM  Unisom, no Gelcaps   Leg Cramps:  Tums  MagGel   Nausea/Vomiting:  Bonine  Dramamine  Emetrol  Ginger extract  Sea bands  Meclizine  Nausea medication to take during pregnancy:  Unisom (doxylamine succinate 25 mg tablets) Take one tablet daily at bedtime. If symptoms are not adequately controlled, the dose can be increased to a maximum recommended dose of two tablets daily (1/2 tablet in the morning, 1/2 tablet mid-afternoon and one at bedtime).  Vitamin B6 100mg  tablets. Take one tablet twice a day (up to 200 mg per day).   Skin Rashes:  Aveeno products  Benadryl  cream or 25mg  every 6 hours as needed  Calamine Lotion  1% cortisone cream   Yeast infection:  Gyne-lotrimin 7  Monistat 7    **If taking multiple medications, please check labels to avoid duplicating the same active ingredients  **take medication as directed on the label  ** Do not exceed 4000 mg of tylenol  in 24 hours  **Do not take medications that contain aspirin or ibuprofen 

## 2023-10-27 NOTE — Progress Notes (Signed)
 New OB Intake  I connected with Janice White  on 10/27/23 at  8:15 AM EDT by MyChart Video Visit and verified that I am speaking with the correct person using two identifiers. Nurse is located at [facility] and pt is located at home.  I discussed the limitations, risks, security and privacy concerns of performing an evaluation and management service by telephone and the availability of in person appointments. I also discussed with the patient that there may be a patient responsible charge related to this service. The patient expressed understanding and agreed to proceed.  I explained I am completing New OB Intake today. We discussed EDD of 05/23/24 based on LMP of 08/16/24. Pt is G2P1001. I reviewed her allergies, medications and Medical/Surgical/OB history.    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  E-prescribed per standing protocol phenergan  and advised pt that medication could cause drowsiness.  Pt encouraged to try hard candy for the metallic taste in mouth.    Delivery Plans Plans to deliver at [facility]. Discussed the nature of our practice with multiple providers including residents and students as well as female and female providers. Due to the size of the practice, the delivering provider may not be the same as those providing prenatal care.   Patient is interested in water birth.  MyChart/Babyscripts MyChart access verified. I explained pt will have some visits in office and some virtually. Babyscripts instructions given and order placed. Patient verifies receipt of registration text/e-mail. Account successfully created and app downloaded. If patient is a candidate for Optimized scheduling, add to sticky note.   Blood Pressure Cuff/Weight Scale Blood pressure cuff ordered for patient to  pick-up from [employer]. Explained after first prenatal appt pt will check weekly and document in Babyscripts. Patient does not have weight scale; order sent to [facility], patient may track weight weekly in Babyscripts.  Anatomy US  Explained first scheduled US  will be around 19 weeks. Anatomy US  scheduled for  at .  Is patient a CenteringPregnancy candidate?  Patient will think about it and review at NEW OB visit.     Is patient a Mom+Baby Combined Care candidate?  Not a candidate   If accepted, confirm patient does not intend to move from the area for at least 12 months, then notify Mom+Baby staff  Is patient a candidate for Babyscripts Optimization? Yes, patient accepted    First visit review I reviewed new OB appt with patient. Explained pt will be seen by [name], MD at first visit. Discussed Janice White genetic screening with patient.  Panorama and Horizon. Routine prenatal labs will be collected at NEW OB visit scheduled on 11/04/23.     Last Pap Patient reports last pap was several years ago.  Janice Upperman, RN 10/27/2023  8:44 AM

## 2023-11-04 ENCOUNTER — Ambulatory Visit (INDEPENDENT_AMBULATORY_CARE_PROVIDER_SITE_OTHER): Payer: Self-pay | Admitting: Obstetrics and Gynecology

## 2023-11-04 ENCOUNTER — Other Ambulatory Visit: Payer: Self-pay

## 2023-11-04 ENCOUNTER — Encounter: Payer: Self-pay | Admitting: Obstetrics and Gynecology

## 2023-11-04 ENCOUNTER — Other Ambulatory Visit (HOSPITAL_COMMUNITY)
Admission: RE | Admit: 2023-11-04 | Discharge: 2023-11-04 | Disposition: A | Discharge status: Home / Self Care | Source: Ambulatory Visit | Attending: Obstetrics and Gynecology | Admitting: Obstetrics and Gynecology

## 2023-11-04 VITALS — BP 144/80 | HR 104 | Wt 142.7 lb

## 2023-11-04 DIAGNOSIS — O10911 Unspecified pre-existing hypertension complicating pregnancy, first trimester: Secondary | ICD-10-CM | POA: Diagnosis not present

## 2023-11-04 DIAGNOSIS — O10919 Unspecified pre-existing hypertension complicating pregnancy, unspecified trimester: Secondary | ICD-10-CM | POA: Insufficient documentation

## 2023-11-04 DIAGNOSIS — Z3481 Encounter for supervision of other normal pregnancy, first trimester: Secondary | ICD-10-CM | POA: Insufficient documentation

## 2023-11-04 DIAGNOSIS — Z3A11 11 weeks gestation of pregnancy: Secondary | ICD-10-CM | POA: Diagnosis not present

## 2023-11-04 DIAGNOSIS — O09291 Supervision of pregnancy with other poor reproductive or obstetric history, first trimester: Secondary | ICD-10-CM

## 2023-11-04 DIAGNOSIS — Z31430 Encounter of female for testing for genetic disease carrier status for procreative management: Secondary | ICD-10-CM

## 2023-11-04 DIAGNOSIS — O0991 Supervision of high risk pregnancy, unspecified, first trimester: Secondary | ICD-10-CM | POA: Diagnosis not present

## 2023-11-04 DIAGNOSIS — O0990 Supervision of high risk pregnancy, unspecified, unspecified trimester: Secondary | ICD-10-CM

## 2023-11-04 DIAGNOSIS — Z3490 Encounter for supervision of normal pregnancy, unspecified, unspecified trimester: Secondary | ICD-10-CM

## 2023-11-04 DIAGNOSIS — O09299 Supervision of pregnancy with other poor reproductive or obstetric history, unspecified trimester: Secondary | ICD-10-CM | POA: Insufficient documentation

## 2023-11-04 DIAGNOSIS — O3111X0 Continuing pregnancy after spontaneous abortion of one fetus or more, first trimester, not applicable or unspecified: Secondary | ICD-10-CM | POA: Diagnosis not present

## 2023-11-04 DIAGNOSIS — O3110X0 Continuing pregnancy after spontaneous abortion of one fetus or more, unspecified trimester, not applicable or unspecified: Secondary | ICD-10-CM

## 2023-11-04 MED ORDER — LABETALOL HCL 100 MG PO TABS: 100.0000 mg | ORAL_TABLET | Freq: Two times a day (BID) | ORAL | 0 refills | Status: AC

## 2023-11-04 MED ORDER — ASPIRIN 81 MG PO TBEC: 81.0000 mg | DELAYED_RELEASE_TABLET | Freq: Every day | ORAL | 1 refills | Status: AC

## 2023-11-04 NOTE — Progress Notes (Signed)
.  ini

## 2023-11-04 NOTE — Patient Instructions (Signed)
 Start the labetalol  (blood pressure medicine) today.  Start the low dose aspirin  in one week

## 2023-11-04 NOTE — Progress Notes (Signed)
 New OB Note  11/04/2023   Clinic: [facility] for [facility]  Chief Complaint: new OB   Transfer of Care Patient: no  History of Present Illness: Ms. Grupp is a 33 y.o. G2P1001 at 11/2 weeks (EDC 4/7, based on Patient's last menstrual period was 08/17/2023.=6wk u/s)  Pregnancy complicated by has Vanishing twin syndrome; Supervision of low-risk pregnancy; Chronic hypertension during pregnancy, antepartum; and History of shoulder dystocia in prior pregnancy, currently pregnant on their problem list.   No OB s/s.   ROS: A 12-point review of systems was performed and negative, except as stated in the above HPI.  OBGYN History: As per HPI. OB History  Gravida Para Term Preterm AB Living  2 1 1  0 0 1  SAB IAB Ectopic Multiple Live Births  0 0 0 0 1    # Outcome Date GA Lbr Len/2nd Weight Sex Type Anes PTL Lv  2 Current           1 Term 01/27/18 [redacted]w[redacted]d 16:52 / 05:42 7 lb 4 oz (3.289 kg) M Vag-Spont EPI  LIV   Past Medical History: Past Medical History:  Diagnosis Date   Asthma    Penicillin allergy 10/02/2020   PONV (postoperative nausea and vomiting)    Past Surgical History: Past Surgical History:  Procedure Laterality Date   WISDOM TOOTH EXTRACTION     Family History:  Family History  Problem Relation Age of Onset   Diabetes Mother    Hypertension Mother    Multiple sclerosis Mother    Heart disease Father    Stroke Father    Diabetes Maternal Grandmother    Social History:  Social History   Socioeconomic History   Marital status: Single    Spouse name: Not on file   Number of children: Not on file   Years of education: Not on file   Highest education level: Some college, no degree  Occupational History   Not on file  Tobacco Use   Smoking status: Never   Smokeless tobacco: Never  Vaping Use   Vaping status: Never Used  Substance and Sexual Activity   Alcohol use: Not Currently    Comment: social drinker   Drug use: No   Sexual  activity: Yes    Birth control/protection: Condom, None  Other Topics Concern   Not on file  Social History Narrative   Not on file   Social Drivers of Health   Financial Resource Strain: Low Risk  (09/30/2023)   Overall Financial Resource Strain (CARDIA)    Difficulty of Paying Living Expenses: Not very hard  Food Insecurity: No Food Insecurity (11/04/2023)   Hunger Vital Sign    Worried About Running Out of Food in the Last Year: Never true    Ran Out of Food in the Last Year: Never true  Transportation Needs: No Transportation Needs (11/04/2023)   PRAPARE - Administrator, Civil Service (Medical): No    Lack of Transportation (Non-Medical): No  Physical Activity: Sufficiently Active (09/30/2023)   Exercise Vital Sign    Days of Exercise per Week: 5 days    Minutes of Exercise per Session: 70 min  Stress: No Stress Concern Present (09/30/2023)   [employer] of Occupational Health - Occupational Stress Questionnaire    Feeling of Stress: Only a little  Social Connections: Moderately Isolated (09/30/2023)   Social Connection and Isolation Panel    Frequency of Communication with Friends and Family: More than three times a  week    Frequency of Social Gatherings with Friends and Family: Three times a week    Attends Religious Services: More than 4 times per year    Active Member of Clubs or Organizations: No    Attends Engineer, structural: Not on file    Marital Status: Never married  Intimate Partner Violence: Not on file   Allergy: No Known Allergies  Current Outpatient Medications: Prenatal vitamin  Physical Exam:   BP (!) 144/80   Pulse (!) 104   Wt 142 lb 11.2 oz (64.7 kg)   LMP 08/17/2023   BMI 27.87 kg/m  Body mass index is 27.87 kg/m. Contractions: Not present Vag. Bleeding: None. FHTs: 160s  General appearance: Well nourished, well developed female in no acute distress.  Neck:  Supple, normal appearance, and no thyromegaly   Cardiovascular: S1, S2 normal, no murmur, rub or gallop, regular rate and rhythm Respiratory:  Clear to auscultation bilateral. Normal respiratory effort Abdomen: positive bowel sounds and no masses, hernias; diffusely non tender to palpation, non distended Breasts: no s/s.  Neuro/Psych:  Normal mood and affect.  Skin:  Warm and dry.  Lymphatic:  No inguinal lymphadenopathy.   Pelvic exam: is not limited by body habitus EGBUS: within normal limits, Vagina: within normal limits and with no blood in the vault, Cervix: normal appearing cervix without discharge or lesions, closed/long/high, Uterus:  enlarged, c/w 10-12 week size, and Adnexa:  normal adnexa and no mass, fullness, tenderness Chaperone with CMA  Laboratory: none  Imaging:  As per HPI  Assessment: patient doing well  Plan: 1. Encounter for supervision of low-risk pregnancy, antepartum (Primary)  2. [redacted] weeks gestation of pregnancy Offer afp. Cma to schedule anatomy u/s - Cytology - PAP( [place]) - Culture, OB Urine - Hemoglobin A1c - CBC/D/Plt+RPR+Rh+ABO+RubIgG... - Comprehensive metabolic panel with GFR - Protein / creatinine ratio, urine - TSH Rfx on Abnormal to Free T4 - US  MFM OB DETAIL +14 WK; Future  3. Chronic hypertension during pregnancy, antepartum Looking at prior BPs, I told her I'd give her diagnosis of CHTN. D/w her re: this. Pt amenable to low dose ASA in 1 week and start labetalol  100 bid starting today. RTC 1wk BP check - Comprehensive metabolic panel with GFR - Protein / creatinine ratio, urine - TSH Rfx on Abnormal to Free T4 - US  MFM OB DETAIL +14 WK; Future  4. Pregnancy - PANORAMA PRENATAL TEST  5. Vanishing twin syndrome D/w her re: this - PANORAMA PRENATAL TEST  6. Encounter of female for testing for genetic disease carrier status for procreative management - [facility] Basic Panel  7. History of shoulder dystocia in prior pregnancy, currently pregnant 2019 40wk spontaneous onset  of labor, 3289gm delivery with shoulder dystocia <30sec, relieved with McRoberts and anterior arm and no deficits  Follow up growth scans in later pregnancy d/w pt re: delivery mode.   Problem list reviewed and updated.  >50% of 40 min visit spent on counseling and coordination of care.  Return in about 1 week (around 11/11/2023) for in person, bp check, rn visit.  No future appointments.  Bebe [name] Overcast MD Attending Center for [facility])

## 2023-11-05 LAB — COMPREHENSIVE METABOLIC PANEL WITH GFR
ALT: 18 IU/L (ref 0–32)
AST: 18 IU/L (ref 0–40)
Albumin: 4.4 g/dL (ref 3.9–4.9)
Alkaline Phosphatase: 63 IU/L (ref 41–116)
BUN/Creatinine Ratio: 9 (ref 9–23)
BUN: 6 mg/dL (ref 6–20)
Bilirubin Total: 0.3 mg/dL (ref 0.0–1.2)
CO2: 18 mmol/L — ABNORMAL LOW (ref 20–29)
Calcium: 9.9 mg/dL (ref 8.7–10.2)
Chloride: 102 mmol/L (ref 96–106)
Creatinine, Ser: 0.68 mg/dL (ref 0.57–1.00)
Globulin, Total: 2.6 g/dL (ref 1.5–4.5)
Glucose: 77 mg/dL (ref 70–99)
Potassium: 4.1 mmol/L (ref 3.5–5.2)
Sodium: 138 mmol/L (ref 134–144)
Total Protein: 7 g/dL (ref 6.0–8.5)
eGFR: 119 mL/min/1.73 (ref >59)

## 2023-11-05 LAB — CBC/D/PLT+RPR+RH+ABO+RUBIGG...
Antibody Screen: NEGATIVE
Basophils Absolute: 0 x10E3/uL (ref 0.0–0.2)
Basos: 0 %
EOS (ABSOLUTE): 0 x10E3/uL (ref 0.0–0.4)
Eos: 0 %
HCV Ab: NONREACTIVE
HIV Screen 4th Generation wRfx: NONREACTIVE
Hematocrit: 36.9 % (ref 34.0–46.6)
Hemoglobin: 11.6 g/dL (ref 11.1–15.9)
Hepatitis B Surface Ag: NEGATIVE
Immature Grans (Abs): 0 x10E3/uL (ref 0.0–0.1)
Immature Granulocytes: 0 %
Lymphocytes Absolute: 2 x10E3/uL (ref 0.7–3.1)
Lymphs: 22 %
MCH: 27.7 pg (ref 26.6–33.0)
MCHC: 31.4 g/dL — ABNORMAL LOW (ref 31.5–35.7)
MCV: 88 fL (ref 79–97)
Monocytes Absolute: 0.6 x10E3/uL (ref 0.1–0.9)
Monocytes: 7 %
Neutrophils Absolute: 6.6 x10E3/uL (ref 1.4–7.0)
Neutrophils: 71 %
Platelets: 311 x10E3/uL (ref 150–450)
RBC: 4.19 x10E6/uL (ref 3.77–5.28)
RDW: 13.2 % (ref 11.7–15.4)
RPR Ser Ql: NONREACTIVE
Rh Factor: POSITIVE
Rubella Antibodies, IGG: 3.25 {index} (ref >0.99)
WBC: 9.3 x10E3/uL (ref 3.4–10.8)

## 2023-11-05 LAB — HEMOGLOBIN A1C
Est. average glucose Bld gHb Est-mCnc: 111 mg/dL
Hgb A1c MFr Bld: 5.5 % (ref 4.8–5.6)

## 2023-11-05 LAB — TSH RFX ON ABNORMAL TO FREE T4: TSH: 0.633 u[IU]/mL (ref 0.450–4.500)

## 2023-11-05 LAB — PROTEIN / CREATININE RATIO, URINE
Creatinine, Urine: 189.7 mg/dL
Protein, Ur: 11.7 mg/dL
Protein/Creat Ratio: 62 mg/g{creat} (ref 0–200)

## 2023-11-05 LAB — HCV INTERPRETATION

## 2023-11-06 LAB — CULTURE, OB URINE

## 2023-11-06 LAB — URINE CULTURE, OB REFLEX

## 2023-11-08 LAB — CYTOLOGY - PAP
Chlamydia: NEGATIVE
Comment: NEGATIVE
Comment: NEGATIVE
Comment: NORMAL
Diagnosis: NEGATIVE
High risk HPV: NEGATIVE
Neisseria Gonorrhea: NEGATIVE

## 2023-11-09 ENCOUNTER — Telehealth: Payer: Self-pay | Admitting: General Practice

## 2023-11-09 LAB — PANORAMA PRENATAL TEST FULL PANEL:PANORAMA TEST PLUS 5 ADDITIONAL MICRODELETIONS: FETAL FRACTION: 9.1

## 2023-11-09 NOTE — Telephone Encounter (Signed)
 Patient called into office and left message on voicemail stating she has a metallic sour taste in her mouth & would like a call back

## 2023-11-10 ENCOUNTER — Other Ambulatory Visit: Payer: Self-pay

## 2023-11-10 ENCOUNTER — Ambulatory Visit (INDEPENDENT_AMBULATORY_CARE_PROVIDER_SITE_OTHER)

## 2023-11-10 VITALS — BP 125/72 | HR 72 | Ht 60.0 in | Wt 140.7 lb

## 2023-11-10 DIAGNOSIS — Z0130 Encounter for examination of blood pressure without abnormal findings: Secondary | ICD-10-CM

## 2023-11-10 DIAGNOSIS — O0990 Supervision of high risk pregnancy, unspecified, unspecified trimester: Secondary | ICD-10-CM

## 2023-11-10 DIAGNOSIS — O10919 Unspecified pre-existing hypertension complicating pregnancy, unspecified trimester: Secondary | ICD-10-CM

## 2023-11-10 NOTE — Progress Notes (Signed)
 Janice White is here 11/10/23 for a Nurse Visit BP check. Patient was seen during her initial prenatal appointment 11/04/23 and was prescribed labetalol  100 bid.  Today [id number] Patient is 12 weeks 1day. Patient denies any headache, vision changes or pain.  Patient states she is taking labetalol  BID. Patient states no complaints or concerns.   Devon, RN

## 2023-11-12 NOTE — Telephone Encounter (Signed)
 RN attempted to return pt call.  Left voicemail with office call back number.   Waddell, RN

## 2023-11-18 LAB — HORIZON CUSTOM: REPORT SUMMARY: POSITIVE — AB

## 2023-11-19 ENCOUNTER — Ambulatory Visit: Payer: Self-pay | Admitting: Obstetrics and Gynecology

## 2023-11-19 DIAGNOSIS — O0990 Supervision of high risk pregnancy, unspecified, unspecified trimester: Secondary | ICD-10-CM

## 2023-11-19 DIAGNOSIS — D563 Thalassemia minor: Secondary | ICD-10-CM | POA: Insufficient documentation

## 2023-11-23 ENCOUNTER — Ambulatory Visit (INDEPENDENT_AMBULATORY_CARE_PROVIDER_SITE_OTHER)

## 2023-11-23 VITALS — BP 124/70 | HR 93 | Wt 141.5 lb

## 2023-11-23 DIAGNOSIS — N390 Urinary tract infection, site not specified: Secondary | ICD-10-CM

## 2023-11-23 NOTE — Progress Notes (Signed)
 Janice White presents to office today reporting urinary frequency and incomplete emptying for past 2 days. UA results were negative. Slightly elevated ketones so discussed drinking plenty of water to stay hydrated. Pt informed to reach back out if symptoms persist or get worse and we can recheck. Patient verbalized understanding to all & had no questions at this time.  Cyndee JAYSON Molt, RN 11/23/2023 1:17 PM

## 2023-11-24 LAB — POCT URINALYSIS DIP (DEVICE)
Bilirubin Urine: NEGATIVE
Glucose, UA: NEGATIVE mg/dL
Hgb urine dipstick: NEGATIVE
Ketones, ur: 40 mg/dL — AB
Leukocytes,Ua: NEGATIVE
Nitrite: NEGATIVE
Protein, ur: NEGATIVE mg/dL
Specific Gravity, Urine: >=1.03 (ref 1.005–1.030)
Urobilinogen, UA: 0.2 mg/dL (ref 0.0–1.0)
pH: 6 (ref 5.0–8.0)

## 2023-11-30 ENCOUNTER — Ambulatory Visit: Admitting: Physician Assistant

## 2023-11-30 ENCOUNTER — Other Ambulatory Visit: Payer: Self-pay

## 2023-11-30 VITALS — BP 137/85 | HR 106 | Wt 145.0 lb

## 2023-11-30 DIAGNOSIS — Z3A15 15 weeks gestation of pregnancy: Secondary | ICD-10-CM | POA: Diagnosis not present

## 2023-11-30 DIAGNOSIS — W19XXXA Unspecified fall, initial encounter: Secondary | ICD-10-CM | POA: Diagnosis not present

## 2023-11-30 DIAGNOSIS — O10912 Unspecified pre-existing hypertension complicating pregnancy, second trimester: Secondary | ICD-10-CM | POA: Diagnosis not present

## 2023-11-30 DIAGNOSIS — O0992 Supervision of high risk pregnancy, unspecified, second trimester: Secondary | ICD-10-CM | POA: Diagnosis not present

## 2023-11-30 DIAGNOSIS — O10919 Unspecified pre-existing hypertension complicating pregnancy, unspecified trimester: Secondary | ICD-10-CM

## 2023-11-30 DIAGNOSIS — O0990 Supervision of high risk pregnancy, unspecified, unspecified trimester: Secondary | ICD-10-CM

## 2023-11-30 NOTE — Progress Notes (Signed)
   PRENATAL VISIT NOTE  Subjective:  Janice White is a 33 y.o. G2P1001 at [redacted]w[redacted]d being seen today for ongoing prenatal care.  She is currently monitored for the following issues for this high-risk pregnancy and has Vanishing twin syndrome; Supervision of high risk pregnancy, antepartum; Chronic hypertension during pregnancy, antepartum; History of shoulder dystocia in prior pregnancy, currently pregnant; and Alpha thalassemia silent carrier on their problem list.  Patient reports fell on her back (flat surface) yesterday while she and younger son were wrestling. Has not had any vaginal bleeding, fluid leaking, or cramping since then. Did have soreness of back, now resolved.     Contractions: Not present. Vag. Bleeding: None.   . Denies leaking of fluid.   The following portions of the patient's history were reviewed and updated as appropriate: allergies, current medications, past family history, past medical history, past social history, past surgical history and problem list.   Objective:    Vitals:   11/30/23 1621  BP: 137/85  Pulse: (!) 106  Weight: 145 lb (65.8 kg)    Fetal Status:           General: Alert, oriented and cooperative. Patient is in no acute distress.  Skin: Skin is warm and dry. No rash noted.   Cardiovascular: Normal heart rate noted  Respiratory: Normal respiratory effort, no problems with respiration noted  Abdomen: Soft, gravid, appropriate for gestational age.  Pain/Pressure: Absent     Pelvic: Cervical exam deferred        Extremities: Normal range of motion.  Edema: None  Mental Status: Normal mood and affect. Normal behavior. Normal judgment and thought content.   Assessment and Plan:  Pregnancy: G2P1001 at [redacted]w[redacted]d  1. Supervision of high risk pregnancy, antepartum (Primary) Patient doing well, feeling regular fetal movement BP, FHR, FH appropriate   2. [redacted] weeks gestation of pregnancy Anticipatory guidance about next visits/weeks of pregnancy given.    3. Chronic hypertension during pregnancy, antepartum Normotensive on labetalol  100 mg BID Baseline labs normal Continue ASA  4. Fall, initial encounter -No vaginal bleeding, cramping/contractions, leaking fluid -Bedside ultrasound shows excellent fetal movement. FHR 147.  -Patient is without pain.  -I reassured patient of fetal status and gave precautions that would warrant seeking emergency care.    Preterm labor symptoms and general obstetric precautions including but not limited to vaginal bleeding, contractions, leaking of fluid and fetal movement were reviewed in detail with the patient.  Please refer to After Visit Summary for other counseling recommendations.   No follow-ups on file.  Future Appointments  Date Time Provider Department Center  12/28/2023  3:55 PM Nicholaus Burnard HERO, MD [facility]  12/31/2023  1:00 PM [facility] PROVIDER 1 [facility]  12/31/2023  1:30 PM [facility] US3 [facility]  01/25/2024  3:55 PM Cleatus Moccasin, MD [facility]    Jorene FORBES Nicholaus, PA-C

## 2023-12-02 ENCOUNTER — Encounter: Admitting: Obstetrics & Gynecology

## 2023-12-06 ENCOUNTER — Telehealth: Payer: Self-pay | Admitting: *Deleted

## 2023-12-06 DIAGNOSIS — Z0289 Encounter for other administrative examinations: Secondary | ICD-10-CM

## 2023-12-06 NOTE — Telephone Encounter (Signed)
 Janice White left a voice message this am that she is calling to make sure if she brings paperwork it can be filled out, requested a call back. I called Allyssa and she confirms it is paperwork confirming her pregnancy and EDD. She states she turned it in today and was completed before but work lost it and needs it redone asap. I explained we try to complete within 10 business days but it may be sooner and I will let the person who completed them know. She voices understanding. Rock Skip PEAK

## 2023-12-08 ENCOUNTER — Telehealth: Payer: Self-pay

## 2023-12-08 NOTE — Telephone Encounter (Signed)
 RN called pt to speak with her about paperwork she submitted for completion.  Advised pt that the papers she dropped off is forms from her employer informing her of leave request and her rights for leave as employee.  None of the papers are a form asking information for a healthcare provider to fill out.  Pt stated she was unsure about paperwork she just gave us  what her job printed for her.  Advised pt she may want to reach out to her job again to see if there is paperwork that is intended for her provider to complete.  Pt requested if she could have a letter stating she is pregnant, her due date, and is receiving prenatal care.  Letter created in MyChart and faxed to [facility] per pt request.    Waddell, RN

## 2023-12-22 ENCOUNTER — Encounter: Payer: Self-pay | Admitting: Obstetrics and Gynecology

## 2023-12-27 DIAGNOSIS — Z0289 Encounter for other administrative examinations: Secondary | ICD-10-CM

## 2023-12-28 ENCOUNTER — Encounter: Payer: Self-pay | Admitting: Obstetrics and Gynecology

## 2023-12-28 ENCOUNTER — Ambulatory Visit: Admitting: Obstetrics and Gynecology

## 2023-12-28 VITALS — BP 104/71 | HR 72 | Wt 140.0 lb

## 2023-12-28 DIAGNOSIS — D563 Thalassemia minor: Secondary | ICD-10-CM

## 2023-12-28 DIAGNOSIS — O09299 Supervision of pregnancy with other poor reproductive or obstetric history, unspecified trimester: Secondary | ICD-10-CM

## 2023-12-28 DIAGNOSIS — O0992 Supervision of high risk pregnancy, unspecified, second trimester: Secondary | ICD-10-CM | POA: Diagnosis not present

## 2023-12-28 DIAGNOSIS — O3110X0 Continuing pregnancy after spontaneous abortion of one fetus or more, unspecified trimester, not applicable or unspecified: Secondary | ICD-10-CM

## 2023-12-28 DIAGNOSIS — Z3A19 19 weeks gestation of pregnancy: Secondary | ICD-10-CM

## 2023-12-28 DIAGNOSIS — O09292 Supervision of pregnancy with other poor reproductive or obstetric history, second trimester: Secondary | ICD-10-CM | POA: Diagnosis not present

## 2023-12-28 DIAGNOSIS — O3120X0 Continuing pregnancy after intrauterine death of one fetus or more, unspecified trimester, not applicable or unspecified: Secondary | ICD-10-CM

## 2023-12-28 DIAGNOSIS — Z23 Encounter for immunization: Secondary | ICD-10-CM | POA: Diagnosis not present

## 2023-12-28 DIAGNOSIS — O10912 Unspecified pre-existing hypertension complicating pregnancy, second trimester: Secondary | ICD-10-CM

## 2023-12-28 DIAGNOSIS — O0990 Supervision of high risk pregnancy, unspecified, unspecified trimester: Secondary | ICD-10-CM

## 2023-12-28 DIAGNOSIS — O10919 Unspecified pre-existing hypertension complicating pregnancy, unspecified trimester: Secondary | ICD-10-CM

## 2023-12-28 NOTE — Progress Notes (Signed)
 PRENATAL VISIT NOTE  Subjective:  Janice White is a 33 y.o. G2P1001 at [redacted]w[redacted]d being seen today for ongoing prenatal care.  She is currently monitored for the following issues for this high-risk pregnancy and has Vanishing twin syndrome; Supervision of high risk pregnancy, antepartum; Chronic hypertension during pregnancy, antepartum; History of shoulder dystocia in prior pregnancy, currently pregnant; and Alpha thalassemia silent carrier on their problem list.  Patient reports no complaints.  Contractions: Not present. Vag. Bleeding: None.  Movement: Present. Denies leaking of fluid.   The following portions of the patient's history were reviewed and updated as appropriate: allergies, current medications, past family history, past medical history, past social history, past surgical history and problem list.   Objective:   Vitals:   12/28/23 1605  BP: 104/71  Pulse: 72  Weight: 140 lb (63.5 kg)    Fetal Status:  Fetal Heart Rate (bpm): 150   Movement: Present    General: Alert, oriented and cooperative. Patient is in no acute distress.  Skin: Skin is warm and dry. No rash noted.   Cardiovascular: Normal heart rate noted  Respiratory: Normal respiratory effort, no problems with respiration noted  Abdomen: Soft, gravid, appropriate for gestational age.  Pain/Pressure: Absent     Pelvic: Cervical exam deferred        Extremities: Normal range of motion.  Edema: None  Mental Status: Normal mood and affect. Normal behavior. Normal judgment and thought content.      11/04/2023    9:25 AM 07/05/2017   11:50 AM 10/29/2016    9:35 AM  Depression screen PHQ 2/9  Decreased Interest 2 0 0  Down, Depressed, Hopeless 0 0 0  PHQ - 2 Score 2 0 0  Altered sleeping 1 1   Tired, decreased energy 2 1   Change in appetite 0 1   Feeling bad or failure about yourself  0 0   Trouble concentrating 0 0   Moving slowly or fidgety/restless 0 0   Suicidal thoughts 0 0   PHQ-9 Score 5  3        Data saved with a previous flowsheet row definition        11/04/2023    9:25 AM 07/05/2017   11:50 AM 08/06/2016    8:40 AM 06/09/2016    2:16 PM  GAD 7 : Generalized Anxiety Score  Nervous, Anxious, on Edge 0 0 0 0  Control/stop worrying 1 0 0 0  Worry too much - different things 1 0 0 0  Trouble relaxing 1 0 0 0  Restless 0 0 0 0  Easily annoyed or irritable 1 1 0 0  Afraid - awful might happen 0 0 0 0  Total GAD 7 Score 4 1 0 0    Assessment and Plan:  Pregnancy: G2P1001 at [redacted]w[redacted]d  1. Supervision of high risk pregnancy, antepartum (Primary) AFP today Anatomy scan 12/31/23 Counseled regarding risks/benefits of flu vaccine, patient accepts vaccine.   2. Vanishing twin syndrome  3. Chronic hypertension during pregnancy, antepartum Cont baby aspirin  Cont labetalol  100 mg BID  4. History of shoulder dystocia in prior pregnancy, currently pregnant  5. Alpha thalassemia silent carrier  6. [redacted] weeks gestation of pregnancy   Preterm labor symptoms and general obstetric precautions including but not limited to vaginal bleeding, contractions, leaking of fluid and fetal movement were reviewed in detail with the patient. Please refer to After Visit Summary for other counseling recommendations.   Return in about 1 month (  around 01/27/2024) for high OB.  Future Appointments  Date Time Provider Department Center  12/31/2023  1:00 PM [facility] PROVIDER 1 [facility]  12/31/2023  1:30 PM [facility] US3 [facility]  01/25/2024  3:55 PM Cleatus Moccasin, MD [facility]    Burnard CHRISTELLA Moats, MD

## 2023-12-30 LAB — AFP, SERUM, OPEN SPINA BIFIDA
AFP MoM: 1.53
AFP Value: 86.1 ng/mL
Gest. Age on Collection Date: 19 wk
Maternal Age At EDD: 32.4 a
OSBR Risk 1 IN: 5046
Test Results:: NEGATIVE
Weight: 140 [lb_av]

## 2023-12-31 ENCOUNTER — Other Ambulatory Visit: Payer: Self-pay | Admitting: *Deleted

## 2023-12-31 ENCOUNTER — Ambulatory Visit: Attending: Obstetrics

## 2023-12-31 ENCOUNTER — Ambulatory Visit (HOSPITAL_BASED_OUTPATIENT_CLINIC_OR_DEPARTMENT_OTHER): Admitting: Obstetrics

## 2023-12-31 VITALS — BP 124/74 | HR 84

## 2023-12-31 DIAGNOSIS — Z363 Encounter for antenatal screening for malformations: Secondary | ICD-10-CM | POA: Insufficient documentation

## 2023-12-31 DIAGNOSIS — O3110X0 Continuing pregnancy after spontaneous abortion of one fetus or more, unspecified trimester, not applicable or unspecified: Secondary | ICD-10-CM

## 2023-12-31 DIAGNOSIS — O0990 Supervision of high risk pregnancy, unspecified, unspecified trimester: Secondary | ICD-10-CM

## 2023-12-31 DIAGNOSIS — Z3A19 19 weeks gestation of pregnancy: Secondary | ICD-10-CM | POA: Diagnosis not present

## 2023-12-31 DIAGNOSIS — O09292 Supervision of pregnancy with other poor reproductive or obstetric history, second trimester: Secondary | ICD-10-CM | POA: Diagnosis not present

## 2023-12-31 DIAGNOSIS — O10912 Unspecified pre-existing hypertension complicating pregnancy, second trimester: Secondary | ICD-10-CM

## 2023-12-31 DIAGNOSIS — O10919 Unspecified pre-existing hypertension complicating pregnancy, unspecified trimester: Secondary | ICD-10-CM | POA: Diagnosis present

## 2023-12-31 DIAGNOSIS — Z148 Genetic carrier of other disease: Secondary | ICD-10-CM | POA: Insufficient documentation

## 2023-12-31 DIAGNOSIS — O280 Abnormal hematological finding on antenatal screening of mother: Secondary | ICD-10-CM | POA: Insufficient documentation

## 2023-12-31 DIAGNOSIS — O09299 Supervision of pregnancy with other poor reproductive or obstetric history, unspecified trimester: Secondary | ICD-10-CM

## 2023-12-31 DIAGNOSIS — Z3A11 11 weeks gestation of pregnancy: Secondary | ICD-10-CM | POA: Diagnosis present

## 2023-12-31 DIAGNOSIS — O10012 Pre-existing essential hypertension complicating pregnancy, second trimester: Secondary | ICD-10-CM | POA: Diagnosis not present

## 2023-12-31 DIAGNOSIS — O3112X0 Continuing pregnancy after spontaneous abortion of one fetus or more, second trimester, not applicable or unspecified: Secondary | ICD-10-CM | POA: Diagnosis not present

## 2023-12-31 NOTE — Progress Notes (Signed)
 MFM Consult Note  Janice White is currently at 19 weeks and 3 days.  She was seen for detailed fetal anatomy scan due to chronic hypertension that is treated with labetalol .  She denies any problems in her current pregnancy.  Her blood pressure today was 124/74.    She has a history of shoulder dystocia during the delivery of her first child.  She had a cell free DNA test earlier in her pregnancy which indicated a low risk for trisomy 87, 22, and 13. A female fetus is predicted.   Her [employer] screening test indicated that she is a silent carrier for alpha thalassemia.    Sonographic findings Single intrauterine pregnancy at 19w 3d. Fetal cardiac activity:  Observed and appears normal. Presentation: Cephalic. The anatomic structures that were well seen appear normal. The anatomic survey is complete.  Fetal biometry shows the estimated fetal weight at the 36th percentile. Amniotic fluid: Within normal limits.  MVP: 3.95 cm. Placenta: Posterior Fundal. Adnexa: No adnexal mass visualized. Cervical length: 3.3 cm.  The patient was informed that anomalies may be missed due to technical limitations. If the fetus is in a suboptimal position or maternal habitus is increased, visualization of the fetus in the maternal uterus may be impaired.  Chronic hypertension The implications and management of chronic hypertension in pregnancy was discussed.  She was advised to continue taking labetalol  as prescribed for blood pressure control throughout her pregnancy.   The increased risk of superimposed preeclampsia, an indicated preterm delivery, and possible fetal growth restriction due to chronic hypertension in pregnancy was discussed.  We will continue to follow her with monthly growth scans.  Weekly fetal testing should be started at around 32 weeks.  To decrease her risk of superimposed preeclampsia, she should continue taking a daily baby aspirin  (81 mg daily) for preeclampsia prophylaxis.    Silent carrier for alpha thalassemia The patient was advised that alpha thalassemia is inherited in an autosomal recessive fashion.  She was advised that her partner may be tested to determine if he is also a carrier for this condition.   Should he be determined not to be a carrier for alpha thalassemia, their child should be at low risk for inheriting this disease.   Should both parents be carriers for this condition, their child may have a 25% chance of inheriting the disease.   Should both parents be identified as carriers for this condition, she understands that an amniocentesis is available for definitive prenatal diagnosis. The patient stated that she was comfortable with what we discussed today and her partner declined to be screened at this time.   She was also offered and declined a meeting with our genetic counselor today.  A follow-up exam was scheduled in 5 weeks to assess the fetal growth.    The patient stated that all of her questions were answered today.  A total of 45 minutes was spent counseling and coordinating the care for this patient.  Greater than 50% of the time was spent in direct face-to-face contact.

## 2024-01-03 ENCOUNTER — Ambulatory Visit: Payer: Self-pay | Admitting: Obstetrics and Gynecology

## 2024-01-05 ENCOUNTER — Encounter: Payer: Self-pay | Admitting: Obstetrics and Gynecology

## 2024-01-12 ENCOUNTER — Telehealth: Payer: Self-pay

## 2024-01-12 ENCOUNTER — Inpatient Hospital Stay (HOSPITAL_COMMUNITY)
Admission: AD | Admit: 2024-01-12 | Discharge: 2024-01-12 | Disposition: A | Discharge status: Home / Self Care | Attending: Obstetrics & Gynecology | Admitting: Obstetrics & Gynecology

## 2024-01-12 ENCOUNTER — Encounter (HOSPITAL_COMMUNITY): Payer: Self-pay | Admitting: Obstetrics & Gynecology

## 2024-01-12 DIAGNOSIS — O0990 Supervision of high risk pregnancy, unspecified, unspecified trimester: Secondary | ICD-10-CM

## 2024-01-12 DIAGNOSIS — Z3A21 21 weeks gestation of pregnancy: Secondary | ICD-10-CM

## 2024-01-12 DIAGNOSIS — Z0371 Encounter for suspected problem with amniotic cavity and membrane ruled out: Secondary | ICD-10-CM | POA: Diagnosis present

## 2024-01-12 LAB — WET PREP, GENITAL
Clue Cells Wet Prep HPF POC: NONE SEEN
Sperm: NONE SEEN
Trich, Wet Prep: NONE SEEN
WBC, Wet Prep HPF POC: <10 (ref <10)
Yeast Wet Prep HPF POC: NONE SEEN

## 2024-01-12 LAB — POCT FERN TEST: POCT Fern Test: NEGATIVE

## 2024-01-12 NOTE — MAU Note (Signed)
 Janice White is a 33 y.o. at [redacted]w[redacted]d here in MAU reporting at 1730 she had just returned from the [facility] and leaked some clear fld. She has not leaked anymore fld since then. Reports good FM. No pain.   LMP: na Onset of complaint: 1730 Pain score: 0 Vitals:   01/12/24 2124 01/12/24 2125  BP:  110/66  Pulse: 78   Resp: 17   Temp: 98.2 F (36.8 C)   SpO2: 100%      FHT: 155  Lab orders placed from triage: none

## 2024-01-12 NOTE — Telephone Encounter (Signed)
 Pt left voicemail stating she is [redacted] weeks pregnant and just experienced a gush of fluid, would like nurse to call her back.   Waddell, RN

## 2024-01-12 NOTE — Telephone Encounter (Signed)
 Returned pt call.  Pt states she was laying on her bed after taking a shower, a trickle of clear fluid came out on her blanket, slightly bigger than a quarter.  She denies and odor, abdominal pain, or vaginal bleeding.  Her baby is currently moving and she has ambulated since this, no other symptoms have developed.  Reviewed with Dr. Eldonna.  Discussed possibility of vaginal discharge, urine, or fluids from recent sexual intercourse.  Advised pt to monitor for any of the additional symptoms I asked, if symptoms progress she may need to be evaluated at MAU.  She verbalized understanding and had no further questions at this time.   Waddell, RN

## 2024-01-12 NOTE — MAU Provider Note (Signed)
 History     CSN: [id number]  Arrival date and time: 01/12/24 1954   Event Date/Time   First Provider Initiated Contact with Patient 01/12/24 2205      Chief Complaint  Patient presents with   Vaginal Discharge    Janice White is a 33 y.o. G2P1001 at [redacted]w[redacted]d who receives care at [facility].  She presents today for concern for leaking of fluid.  She states around 1600 she had a nice gush of fluid. She reports she was laying down. She states it was on her blanket and she assumed it was clear. She reports she went to the bathroom prior to the incident and denies odor. She denies gushes since this incident. She endorses sexual activity this morning. No bleeding or discharge prior to gush. No cramping or contractions. She endorses fetal movement.   OB History     Gravida  2   Para  1   Term  1   Preterm  0   AB  0   Living  1      SAB  0   IAB  0   Ectopic  0   Multiple  0   Live Births  1           Past Medical History:  Diagnosis Date   Asthma    Penicillin allergy 10/02/2020   PONV (postoperative nausea and vomiting)     Past Surgical History:  Procedure Laterality Date   WISDOM TOOTH EXTRACTION      Family History  Problem Relation Age of Onset   Diabetes Mother    Hypertension Mother    Multiple sclerosis Mother    Heart disease Father    Stroke Father    Diabetes Maternal Grandmother     Social History   Tobacco Use   Smoking status: Never   Smokeless tobacco: Never  Vaping Use   Vaping status: Never Used  Substance Use Topics   Alcohol use: Not Currently    Comment: social drinker   Drug use: No    Allergies: No Known Allergies  Medications Prior to Admission  Medication Sig Dispense Refill Last Dose/Taking   aspirin  EC 81 MG tablet Take 1 tablet (81 mg total) by mouth daily. 60 tablet 1    Blood Pressure Monitoring (BLOOD PRESSURE CUFF) MISC 1 Device by Does not apply route as needed. 1 each 0    labetalol  (NORMODYNE )  100 MG tablet Take 1 tablet (100 mg total) by mouth 2 (two) times daily. 60 tablet 0    Prenatal Vit-Fe Fumarate-FA (PREPLUS) 27-1 MG TABS Take 1 tablet by mouth daily. 30 tablet 13    promethazine  (PHENERGAN ) 25 MG tablet Take 1 tablet (25 mg total) by mouth every 6 (six) hours as needed for nausea or vomiting. 30 tablet 1     Review of Systems  Gastrointestinal:  Negative for abdominal pain, nausea and vomiting.  Genitourinary:  Positive for vaginal discharge. Negative for difficulty urinating, dysuria and vaginal bleeding.   Physical Exam   Blood pressure 110/66, pulse 78, temperature 98.2 F (36.8 C), resp. rate 17, height 5' (1.524 m), weight 64.7 kg, last menstrual period 08/17/2023, SpO2 100%.  Physical Exam Vitals reviewed. Exam conducted with a chaperone present Gregery, RN).  Constitutional:      Appearance: Normal appearance.  HENT:     Head: Normocephalic and atraumatic.  Eyes:     Conjunctiva/sclera: Conjunctivae normal.  Cardiovascular:     Rate and Rhythm: Normal rate.  Pulmonary:     Effort: Pulmonary effort is normal. No respiratory distress.  Genitourinary:    Comments: Speculum Exam: -Normal External Genitalia: Non tender, scant discharge at introitus.  -Vaginal Vault: Pink mucosa with good rugae. Negative pooling. Moderate amt mucoid discharge -wet prep and fern collected -Cervix:Pink, no lesions, cysts, or polyps.  Appears closed. Negative valsalva.  -Bimanual Exam:  Closed Musculoskeletal:        General: Normal range of motion.     Cervical back: Normal range of motion.  Skin:    General: Skin is warm and dry.  Neurological:     Mental Status: She is alert and oriented to person, place, and time.  Psychiatric:        Mood and Affect: Mood normal.        Behavior: Behavior normal.     MAU Course  Procedures Results for orders placed or performed during the hospital encounter of 01/12/24 (from the past 24 hours)  Wet prep, genital     Status: None    Collection Time: 01/12/24 10:23 PM  Result Value Ref Range   Yeast Wet Prep HPF POC NONE SEEN NONE SEEN   Trich, Wet Prep NONE SEEN NONE SEEN   Clue Cells Wet Prep HPF POC NONE SEEN NONE SEEN   WBC, Wet Prep HPF POC <10 <10   Sperm NONE SEEN   Fern Test     Status: Normal   Collection Time: 01/12/24 10:44 PM  Result Value Ref Range   POCT Fern Test Negative = intact amniotic membranes     MDM Pelvic Exam Fern Wet Prep Assessment and Plan  33 year old, G2P1001  SIUP at 21.1 weeks R/O ROM  -Reviewed POC with patient. -Exam performed and findings discussed.  -Informed that exam is not suggestive of leaking. -Wet prep and Fern collected. -Fern negative, +Sperm noted. -Patient agreeable to discharge and will send results and treatment (if appropriate) to mychart.  -Precautions reviewed.  -Discharged to home in stable condition.   Harlene LITTIE Duncans MSN, CNM Advanced Practice Provider, Center for [facility] 01/12/2024, 10:05 PM

## 2024-01-12 NOTE — Progress Notes (Signed)
Written and verbal d/c instructions given by Joline Salt RN and pt voiced understanding

## 2024-01-13 ENCOUNTER — Ambulatory Visit: Payer: Self-pay

## 2024-01-25 ENCOUNTER — Ambulatory Visit: Admitting: Obstetrics and Gynecology

## 2024-01-25 ENCOUNTER — Other Ambulatory Visit: Payer: Self-pay

## 2024-01-25 ENCOUNTER — Encounter: Payer: Self-pay | Admitting: Obstetrics and Gynecology

## 2024-01-25 VITALS — BP 117/83 | HR 98 | Wt 143.7 lb

## 2024-01-25 DIAGNOSIS — O09292 Supervision of pregnancy with other poor reproductive or obstetric history, second trimester: Secondary | ICD-10-CM

## 2024-01-25 DIAGNOSIS — O0992 Supervision of high risk pregnancy, unspecified, second trimester: Secondary | ICD-10-CM | POA: Diagnosis not present

## 2024-01-25 DIAGNOSIS — D563 Thalassemia minor: Secondary | ICD-10-CM

## 2024-01-25 DIAGNOSIS — O09299 Supervision of pregnancy with other poor reproductive or obstetric history, unspecified trimester: Secondary | ICD-10-CM

## 2024-01-25 DIAGNOSIS — O3112X0 Continuing pregnancy after spontaneous abortion of one fetus or more, second trimester, not applicable or unspecified: Secondary | ICD-10-CM | POA: Diagnosis not present

## 2024-01-25 DIAGNOSIS — Z3A23 23 weeks gestation of pregnancy: Secondary | ICD-10-CM

## 2024-01-25 DIAGNOSIS — O0990 Supervision of high risk pregnancy, unspecified, unspecified trimester: Secondary | ICD-10-CM

## 2024-01-25 DIAGNOSIS — O3110X0 Continuing pregnancy after spontaneous abortion of one fetus or more, unspecified trimester, not applicable or unspecified: Secondary | ICD-10-CM

## 2024-01-25 DIAGNOSIS — O10919 Unspecified pre-existing hypertension complicating pregnancy, unspecified trimester: Secondary | ICD-10-CM

## 2024-01-25 DIAGNOSIS — O10912 Unspecified pre-existing hypertension complicating pregnancy, second trimester: Secondary | ICD-10-CM

## 2024-01-25 NOTE — Progress Notes (Signed)
 PRENATAL VISIT NOTE  Subjective:  Janice White is a 33 y.o. G2P1001 at [redacted]w[redacted]d being seen today for ongoing prenatal care.  She is currently monitored for the following issues for this high-risk pregnancy and has Vanishing twin syndrome; Supervision of high risk pregnancy, antepartum; Chronic hypertension during pregnancy, antepartum; History of shoulder dystocia in prior pregnancy, currently pregnant; and Alpha thalassemia silent carrier on their problem list.  Patient reports no complaints.  Contractions: Not present. Vag. Bleeding: None.  Movement: Present. Denies leaking of fluid.   The following portions of the patient's history were reviewed and updated as appropriate: allergies, current medications, past family history, past medical history, past social history, past surgical history and problem list.   Objective:   Vitals:   01/25/24 1605  BP: 117/83  Pulse: 98  Weight: 143 lb 11.2 oz (65.2 kg)    Fetal Status:  Fetal Heart Rate (bpm): 146 Fundal Height: 24 cm Movement: Present    General: Alert, oriented and cooperative. Patient is in no acute distress.  Skin: Skin is warm and dry. No rash noted.   Cardiovascular: Normal heart rate noted  Respiratory: Normal respiratory effort, no problems with respiration noted  Abdomen: Soft, gravid, appropriate for gestational age.  Pain/Pressure: Absent     Pelvic: Cervical exam deferred        Extremities: Normal range of motion.  Edema: None  Mental Status: Normal mood and affect. Normal behavior. Normal judgment and thought content.      11/04/2023    9:25 AM 07/05/2017   11:50 AM 10/29/2016    9:35 AM  Depression screen PHQ 2/9  Decreased Interest 2 0 0  Down, Depressed, Hopeless 0 0 0  PHQ - 2 Score 2 0 0  Altered sleeping 1 1   Tired, decreased energy 2 1   Change in appetite 0 1   Feeling bad or failure about yourself  0 0   Trouble concentrating 0 0   Moving slowly or fidgety/restless 0 0   Suicidal thoughts 0 0    PHQ-9 Score 5  3       Data saved with a previous flowsheet row definition        11/04/2023    9:25 AM 07/05/2017   11:50 AM 08/06/2016    8:40 AM 06/09/2016    2:16 PM  GAD 7 : Generalized Anxiety Score  Nervous, Anxious, on Edge 0 0 0 0  Control/stop worrying 1 0 0 0  Worry too much - different things 1 0 0 0  Trouble relaxing 1 0 0 0  Restless 0 0 0 0  Easily annoyed or irritable 1 1 0 0  Afraid - awful might happen 0 0 0 0  Total GAD 7 Score 4 1 0 0    Assessment and Plan:  Pregnancy: G2P1001 at [redacted]w[redacted]d 1. Supervision of high risk pregnancy, antepartum (Primary) 28w labs next time. Fasting reviewed.  Next growth is 12/18.   2. Pregnancy with 23 completed weeks gestation  3. History of shoulder dystocia in prior pregnancy, currently pregnant Reviewed risk of recurrence. Reviewed size of baby does not predict occurrence of shoulder dystocia. Reviewed risks of shoulder dystocia. Reviewed her options I.e. LTCS vs SVD. She voices understanding. She understands we will guide her in decision making but if she wishes to her LTCS, we would honor this.   4. Chronic hypertension during pregnancy, antepartum Labetalol  100 bid. Continue ldASA.  BP today is wnl.  Plan for serial  growth. Antenatal testing at 32w.   5. Alpha thalassemia silent carrier Declined genetics.  FOB testing: Declines.   6. Vanishing twin syndrome Expect resolution by 12/18. AFP was normal.   Preterm labor symptoms and general obstetric precautions including but not limited to vaginal bleeding, contractions, leaking of fluid and fetal movement were reviewed in detail with the patient. Please refer to After Visit Summary for other counseling recommendations.   Return in about 5 weeks (around 02/29/2024) for babyscripts, OB VISIT, MD or APP.  Future Appointments  Date Time Provider Department Center  02/03/2024  1:15 PM [facility] PROVIDER 1 [facility]  02/03/2024  1:30 PM [facility] US2 [facility]     Vina Solian, MD

## 2024-01-27 ENCOUNTER — Telehealth: Payer: Self-pay

## 2024-01-27 NOTE — Telephone Encounter (Signed)
 Pt left voicemail stating she's experiencing lower abdominal pain and buttocks pain while at work.  Requesting call back.    Waddell, RN

## 2024-01-27 NOTE — Telephone Encounter (Signed)
 Attempted to return pt call.  Left voicemail with office call back number.    Waddell, RN

## 2024-02-03 ENCOUNTER — Ambulatory Visit: Attending: Obstetrics

## 2024-02-03 ENCOUNTER — Ambulatory Visit

## 2024-02-03 VITALS — BP 124/64 | HR 95

## 2024-02-03 VITALS — BP 122/80 | HR 95

## 2024-02-03 DIAGNOSIS — O3110X0 Continuing pregnancy after spontaneous abortion of one fetus or more, unspecified trimester, not applicable or unspecified: Secondary | ICD-10-CM

## 2024-02-03 DIAGNOSIS — Z3A24 24 weeks gestation of pregnancy: Secondary | ICD-10-CM | POA: Insufficient documentation

## 2024-02-03 DIAGNOSIS — O43192 Other malformation of placenta, second trimester: Secondary | ICD-10-CM | POA: Diagnosis not present

## 2024-02-03 DIAGNOSIS — O10919 Unspecified pre-existing hypertension complicating pregnancy, unspecified trimester: Secondary | ICD-10-CM | POA: Insufficient documentation

## 2024-02-03 DIAGNOSIS — O43122 Velamentous insertion of umbilical cord, second trimester: Secondary | ICD-10-CM | POA: Insufficient documentation

## 2024-02-03 DIAGNOSIS — O09299 Supervision of pregnancy with other poor reproductive or obstetric history, unspecified trimester: Secondary | ICD-10-CM | POA: Insufficient documentation

## 2024-02-03 DIAGNOSIS — O0990 Supervision of high risk pregnancy, unspecified, unspecified trimester: Secondary | ICD-10-CM

## 2024-02-03 DIAGNOSIS — O10012 Pre-existing essential hypertension complicating pregnancy, second trimester: Secondary | ICD-10-CM | POA: Diagnosis not present

## 2024-02-03 DIAGNOSIS — O3112X0 Continuing pregnancy after spontaneous abortion of one fetus or more, second trimester, not applicable or unspecified: Secondary | ICD-10-CM

## 2024-02-03 DIAGNOSIS — O09292 Supervision of pregnancy with other poor reproductive or obstetric history, second trimester: Secondary | ICD-10-CM | POA: Diagnosis not present

## 2024-02-03 NOTE — Progress Notes (Signed)
 Patient information  Patient Name: Janice White  Patient MRN:   [id number]  Referring practice: MFM Referring Provider: [facility])  Problem List   Patient Active Problem List   Diagnosis Date Noted   Alpha thalassemia silent carrier 11/19/2023   Chronic hypertension during pregnancy, antepartum 11/04/2023   History of shoulder dystocia in prior pregnancy, currently pregnant 11/04/2023   Supervision of high risk pregnancy, antepartum 10/27/2023   Vanishing twin syndrome 10/01/2023    Maternal Fetal medicine Consult  Janice White is a 33 y.o. G2P1001 at [redacted]w[redacted]d here for ultrasound and consultation. Janice White is doing well today with no acute concerns. Today we focused on the following:   The patient is here for growth ultrasound due to chronic hypertension.  Blood pressure is well-controlled on labetalol  100 mg twice daily.    There is a velamentous cord insertion on today's ultrasound. We discussed the diagnosis, clinical significance, and management of a velamentous umbilical cord insertion. This condition occurs in approximately 1% of singleton pregnancies, with even higher rates observed in multifetal pregnancies. In twin gestations, the fetus with a velamentous cord insertion is often White compared to those with a normal cord insertion. Prenatal ultrasound can typically identify velamentous cord insertion, although differentiation from a marginal cord insertion may be challenging. If the velamentous cord is located within 5 cm of the internal cervical os, it is classified as vasa previa. Today's ultrasound does not show evidence of vasa previa. While most pregnancies with velamentous cord insertion are uncomplicated, some studies suggest an association with a mild to moderate increase in fetal growth restriction, preterm delivery, congenital anomalies, low Apgar scores, fetal and neonatal death, retained placenta, and postpartum hemorrhage. These  risks may stem from the absence of Whartons jelly surrounding the fetal vessels, increasing their vulnerability to compression or rupture. Intrapartum fetal heart rate patterns may demonstrate variable decelerations, which could necessitate cesarean delivery if not responsive to intrauterine resuscitation.  Recommendations -Continue serial growth ultrasounds every 4 weeks until delivery -Week antenatal testing to start around 32 weeks or sooner if indicated - Signs and symptoms of preeclampsia discussed.  Standard OB precautions were given to the patient. The patient had time to ask questions that were answered to her satisfaction.  She verbalized understanding and agrees to proceed with the plan above.   I spent 20 minutes reviewing the patients chart, including labs and images as well as counseling the patient about her medical conditions. Greater than 50% of the time was spent in direct face-to-face patient counseling.  Janice White  MFM, Janice White   02/03/2024  2:11 PM   Review of Systems: A review of systems was performed and was negative except per HPI   Vitals and Physical Exam    02/03/2024    1:14 PM 01/25/2024    4:05 PM 01/12/2024    9:25 PM  Vitals with BMI  Weight  143 lbs 11 oz   BMI  28.06   Systolic 124 117 889  Diastolic 64 83 66  Pulse 95 98     Sitting comfortably on the sonogram table Nonlabored breathing Normal rate and rhythm Abdomen is nontender  Past pregnancies OB History  Gravida Para Term Preterm AB Living  2 1 1  0 0 1  SAB IAB Ectopic Multiple Live Births  0 0 0 0 1    # Outcome Date GA Lbr Len/2nd Weight Sex Type Anes PTL Lv  2 Current  1 Term 01/27/18 [redacted]w[redacted]d 16:52 / 05:42 7 lb 4 oz (3.289 kg) M Vag-Spont EPI  LIV     Future Appointments  Date Time Provider Department Center  02/28/2024 10:55 AM Fredirick Glenys RAMAN, MD [facility]

## 2024-02-04 ENCOUNTER — Ambulatory Visit

## 2024-02-16 ENCOUNTER — Encounter (HOSPITAL_COMMUNITY): Payer: Self-pay | Admitting: Obstetrics and Gynecology

## 2024-02-16 ENCOUNTER — Inpatient Hospital Stay (HOSPITAL_COMMUNITY)
Admission: AD | Admit: 2024-02-16 | Discharge: 2024-02-16 | Disposition: A | Discharge status: Home / Self Care | Source: Home / Self Care | Attending: Obstetrics and Gynecology | Admitting: Obstetrics and Gynecology

## 2024-02-16 DIAGNOSIS — O0992 Supervision of high risk pregnancy, unspecified, second trimester: Secondary | ICD-10-CM | POA: Insufficient documentation

## 2024-02-16 DIAGNOSIS — O26892 Other specified pregnancy related conditions, second trimester: Secondary | ICD-10-CM | POA: Diagnosis not present

## 2024-02-16 DIAGNOSIS — Z3A26 26 weeks gestation of pregnancy: Secondary | ICD-10-CM | POA: Diagnosis not present

## 2024-02-16 DIAGNOSIS — O99612 Diseases of the digestive system complicating pregnancy, second trimester: Secondary | ICD-10-CM | POA: Diagnosis not present

## 2024-02-16 DIAGNOSIS — K219 Gastro-esophageal reflux disease without esophagitis: Secondary | ICD-10-CM | POA: Diagnosis not present

## 2024-02-16 DIAGNOSIS — R1013 Epigastric pain: Secondary | ICD-10-CM | POA: Diagnosis present

## 2024-02-16 DIAGNOSIS — O0990 Supervision of high risk pregnancy, unspecified, unspecified trimester: Secondary | ICD-10-CM

## 2024-02-16 LAB — COMPREHENSIVE METABOLIC PANEL WITH GFR
ALT: 12 U/L (ref 0–44)
AST: 21 U/L (ref 15–41)
Albumin: 3.5 g/dL (ref 3.5–5.0)
Alkaline Phosphatase: 88 U/L (ref 38–126)
Anion gap: 9 (ref 5–15)
BUN: <5 mg/dL — ABNORMAL LOW (ref 6–20)
CO2: 22 mmol/L (ref 22–32)
Calcium: 9 mg/dL (ref 8.9–10.3)
Chloride: 103 mmol/L (ref 98–111)
Creatinine, Ser: 0.47 mg/dL (ref 0.44–1.00)
GFR, Estimated: >60 mL/min
Glucose, Bld: 98 mg/dL (ref 70–99)
Potassium: 3.6 mmol/L (ref 3.5–5.1)
Sodium: 134 mmol/L — ABNORMAL LOW (ref 135–145)
Total Bilirubin: 0.2 mg/dL (ref 0.0–1.2)
Total Protein: 6.4 g/dL — ABNORMAL LOW (ref 6.5–8.1)

## 2024-02-16 LAB — CBC
HCT: 30.3 % — ABNORMAL LOW (ref 36.0–46.0)
Hemoglobin: 10.1 g/dL — ABNORMAL LOW (ref 12.0–15.0)
MCH: 27.6 pg (ref 26.0–34.0)
MCHC: 33.3 g/dL (ref 30.0–36.0)
MCV: 82.8 fL (ref 80.0–100.0)
Platelets: 252 K/uL (ref 150–400)
RBC: 3.66 MIL/uL — ABNORMAL LOW (ref 3.87–5.11)
RDW: 13.2 % (ref 11.5–15.5)
WBC: 9.7 K/uL (ref 4.0–10.5)
nRBC: 0.2 % (ref 0.0–0.2)

## 2024-02-16 LAB — URINALYSIS, ROUTINE W REFLEX MICROSCOPIC
Bacteria, UA: NONE SEEN
Bilirubin Urine: NEGATIVE
Glucose, UA: NEGATIVE mg/dL
Hgb urine dipstick: NEGATIVE
Ketones, ur: 5 mg/dL — AB
Nitrite: NEGATIVE
Protein, ur: NEGATIVE mg/dL
Specific Gravity, Urine: 1.014 (ref 1.005–1.030)
pH: 6 (ref 5.0–8.0)

## 2024-02-16 LAB — LIPASE, BLOOD: Lipase: 46 U/L (ref 11–51)

## 2024-02-16 NOTE — MAU Provider Note (Signed)
 " History     [id number]  Arrival date and time: 02/16/24 1254    Chief Complaint  Patient presents with   Abdominal Pain     HPI Janice White is a 33 y.o. at [redacted]w[redacted]d who presents for epigastric pain that occurred earlier today but that is now gone. The pain did not radiate, no aggravating or alleviating factors. 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 Denies VB, LOF contractions. Endorses good FM.   A/Positive/-- (09/18 1031)  Past Medical History:  Diagnosis Date   Asthma    Penicillin allergy 10/02/2020   PONV (postoperative nausea and vomiting)     Past Surgical History:  Procedure Laterality Date   WISDOM TOOTH EXTRACTION      Family History  Problem Relation Age of Onset   Diabetes Mother    Hypertension Mother    Multiple sclerosis Mother    Heart disease Father    Stroke Father    Diabetes Maternal Grandmother     Social History   Socioeconomic History   Marital status: Single    Spouse name: Not on file   Number of children: Not on file   Years of education: Not on file   Highest education level: Some college, no degree  Occupational History   Not on file  Tobacco Use   Smoking status: Never   Smokeless tobacco: Never  Vaping Use   Vaping status: Never Used  Substance and Sexual Activity   Alcohol use: Not Currently    Comment: social drinker   Drug use: No   Sexual activity: Yes    Birth control/protection: Condom, None  Other Topics Concern   Not on file  Social History Narrative   Not on file   Social Drivers of Health   Tobacco Use: Low Risk (02/16/2024)   Patient History    Smoking Tobacco Use: Never    Smokeless Tobacco Use: Never    Passive Exposure: Not on file  Financial Resource Strain: Low Risk (09/30/2023)   Overall Financial Resource Strain (CARDIA)    Difficulty of Paying Living Expenses: Not very hard  Food Insecurity: No Food Insecurity  (11/04/2023)   Epic    Worried About Programme Researcher, Broadcasting/film/video in the Last Year: Never true    Ran Out of Food in the Last Year: Never true  Transportation Needs: No Transportation Needs (11/04/2023)   Epic    Lack of Transportation (Medical): No    Lack of Transportation (Non-Medical): No  Physical Activity: Sufficiently Active (09/30/2023)   Exercise Vital Sign    Days of Exercise per Week: 5 days    Minutes of Exercise per Session: 70 min  Stress: No Stress Concern Present (09/30/2023)   [employer] of Occupational Health - Occupational Stress Questionnaire    Feeling of Stress: Only a little  Social Connections: Moderately Isolated (09/30/2023)   Social Connection and Isolation Panel    Frequency of Communication with Friends and Family: More than three times a week    Frequency of Social Gatherings with Friends and Family: Three times a week    Attends Religious Services: More than 4 times per year    Active Member of Clubs or Organizations: No    Attends Banker Meetings: Not on file    Marital Status: Never married  Intimate Partner Violence: Not on file  Depression (PHQ2-9): Medium Risk (11/04/2023)   Depression (  PHQ2-9)    PHQ-2 Score: 5  Alcohol Screen: Low Risk (09/30/2023)   Alcohol Screen    Last Alcohol Screening Score (AUDIT): 1  Housing: Low Risk (09/30/2023)   Epic    Unable to Pay for Housing in the Last Year: No    Number of Times Moved in the Last Year: 0    Homeless in the Last Year: No  Utilities: Not on file  Health Literacy: Not on file    Allergies[1]  Medications Ordered Prior to Encounter[2]  Pertinent positives and negative per HPI, all others reviewed and negative  Physical Exam   BP 115/63 (BP Location: Right Arm)   Pulse 78   Temp 98.1 F (36.7 C)   Resp 15   Ht 5' (1.524 m)   Wt 64.9 kg   LMP 08/17/2023   SpO2 99%   BMI 27.95 kg/m   Patient Vitals for the past 24 hrs:  BP Temp Pulse Resp SpO2 Height Weight  02/16/24  1338 115/63 98.1 F (36.7 C) 78 15 99 % 5' (1.524 m) 64.9 kg    Physical Exam Vitals and nursing note reviewed.  Constitutional:      Appearance: She is well-developed.  HENT:     Head: Normocephalic and atraumatic.     Mouth/Throat:     Mouth: Mucous membranes are moist.  Eyes:     Extraocular Movements: Extraocular movements intact.  Cardiovascular:     Rate and Rhythm: Normal rate and regular rhythm.  Pulmonary:     Effort: Pulmonary effort is normal.  Abdominal:     Palpations: Abdomen is soft.     Tenderness: There is no abdominal tenderness.  Skin:    Capillary Refill: Capillary refill takes less than 2 seconds.  Neurological:     General: No focal deficit present.     Mental Status: She is alert.     FHT: 140, moderate variability, accels present, decels absent, no contractions  Labs Results for orders placed or performed during the hospital encounter of 02/16/24 (from the past 24 hours)  Urinalysis, Routine w reflex microscopic -Urine, Clean Catch     Status: Abnormal   Collection Time: 02/16/24  1:35 PM  Result Value Ref Range   Color, Urine YELLOW YELLOW   APPearance CLEAR CLEAR   Specific Gravity, Urine 1.014 1.005 - 1.030   pH 6.0 5.0 - 8.0   Glucose, UA NEGATIVE NEGATIVE mg/dL   Hgb urine dipstick NEGATIVE NEGATIVE   Bilirubin Urine NEGATIVE NEGATIVE   Ketones, ur 5 (A) NEGATIVE mg/dL   Protein, ur NEGATIVE NEGATIVE mg/dL   Nitrite NEGATIVE NEGATIVE   Leukocytes,Ua TRACE (A) NEGATIVE   RBC / HPF 0-5 0 - 5 RBC/hpf   WBC, UA 0-5 0 - 5 WBC/hpf   Bacteria, UA NONE SEEN NONE SEEN   Squamous Epithelial / HPF 0-5 0 - 5 /HPF   Mucus PRESENT   Comprehensive metabolic panel     Status: Abnormal   Collection Time: 02/16/24  2:51 PM  Result Value Ref Range   Sodium 134 (L) 135 - 145 mmol/L   Potassium 3.6 3.5 - 5.1 mmol/L   Chloride 103 98 - 111 mmol/L   CO2 22 22 - 32 mmol/L   Glucose, Bld 98 70 - 99 mg/dL   BUN <5 (L) 6 - 20 mg/dL   Creatinine, Ser 9.52  0.44 - 1.00 mg/dL   Calcium 9.0 8.9 - 89.6 mg/dL   Total Protein 6.4 (L) 6.5 - 8.1 g/dL  Albumin 3.5 3.5 - 5.0 g/dL   AST 21 15 - 41 U/L   ALT 12 0 - 44 U/L   Alkaline Phosphatase 88 38 - 126 U/L   Total Bilirubin 0.2 0.0 - 1.2 mg/dL   GFR, Estimated >39 >39 mL/min   Anion gap 9 5 - 15  CBC     Status: Abnormal   Collection Time: 02/16/24  2:51 PM  Result Value Ref Range   WBC 9.7 4.0 - 10.5 K/uL   RBC 3.66 (L) 3.87 - 5.11 MIL/uL   Hemoglobin 10.1 (L) 12.0 - 15.0 g/dL   HCT 69.6 (L) 63.9 - 53.9 %   MCV 82.8 80.0 - 100.0 fL   MCH 27.6 26.0 - 34.0 pg   MCHC 33.3 30.0 - 36.0 g/dL   RDW 86.7 88.4 - 84.4 %   Platelets 252 150 - 400 K/uL   nRBC 0.2 0.0 - 0.2 %  Lipase, blood     Status: None   Collection Time: 02/16/24  2:51 PM  Result Value Ref Range   Lipase 46 11 - 51 U/L    Imaging No results found.  MAU Course  Procedures  Lab Orders         Urinalysis, Routine w reflex microscopic -Urine, Clean Catch         Comprehensive metabolic panel         CBC         Lipase, blood    No orders of the defined types were placed in this encounter.  Imaging Orders  No imaging studies ordered today    MDM Moderate (Level 3-4)  Assessment and Plan  Supervision of high risk pregnancy, antepartum  Gastroesophageal reflux disease without esophagitis  [redacted] weeks gestation of pregnancy   #FWB: NST: Reactive   Janice White is a 33 y.o. at [redacted]w[redacted]d who presents for epigastric pain that occurred earlier today but that is now gone.  -Currently asymptomatic -CBC/CMP/Lipase unremarkable for acute process  -Discussed likely diagnosis of GERD. Discussed OTC medications to help -Patient asks for work note which is given to patient -Stable for discharge home.     Daeja Helderman L Hercules Hasler, MD/MHA 02/16/2024 4:15 PM  Allergies as of 02/16/2024   No Known Allergies      Medication List     TAKE these medications    aspirin  EC 81 MG tablet Take 1 tablet (81 mg total) by  mouth daily.   Blood Pressure Cuff Misc 1 Device by Does not apply route as needed.   labetalol  100 MG tablet Commonly known as: NORMODYNE  Take 1 tablet (100 mg total) by mouth 2 (two) times daily.   PrePLUS 27-1 MG Tabs Take 1 tablet by mouth daily.   promethazine  25 MG tablet Commonly known as: PHENERGAN  Take 1 tablet (25 mg total) by mouth every 6 (six) hours as needed for nausea or vomiting.           [1] No Known Allergies [2]  No current facility-administered medications on file prior to encounter.   Current Outpatient Medications on File Prior to Encounter  Medication Sig Dispense Refill   aspirin  EC 81 MG tablet Take 1 tablet (81 mg total) by mouth daily. 60 tablet 1   labetalol  (NORMODYNE ) 100 MG tablet Take 1 tablet (100 mg total) by mouth 2 (two) times daily. 60 tablet 0   Prenatal Vit-Fe Fumarate-FA (PREPLUS) 27-1 MG TABS Take 1 tablet by mouth daily. 30 tablet 13   Blood Pressure Monitoring (  BLOOD PRESSURE CUFF) MISC 1 Device by Does not apply route as needed. 1 each 0   promethazine  (PHENERGAN ) 25 MG tablet Take 1 tablet (25 mg total) by mouth every 6 (six) hours as needed for nausea or vomiting. 30 tablet 1   "

## 2024-02-16 NOTE — MAU Note (Signed)
 Janice White is a 33 y.o. at [redacted]w[redacted]d here in MAU reporting: mid upper abd cramping since 1130 today. Pain is intermitten now, was constant while she was at work. Denies bleeding or ROM. Reports positive fetal movement.   Onset of complaint: 1130 today Pain score: 0/10 right now, 10/10 when she was at work.  Vitals:   02/16/24 1338  BP: 115/63  Pulse: 78  Resp: 15  Temp: 98.1 F (36.7 C)  SpO2: 99%     FHT: 134  Lab orders placed from triage: ua

## 2024-02-22 ENCOUNTER — Telehealth: Payer: Self-pay

## 2024-02-22 NOTE — Telephone Encounter (Signed)
 Patient called office to request to speak to someone regarding possible contractions. Patient reports severe pain in right upper abdomen began this morning at 7:30 AM. She has not tried anything to relieve pain due to getting her child ready for school. During phone call patient is unable to speak in complete sentences and is audibly moaning through the pain. She has not noticed vaginal bleeding or leaking fluid today. She has not felt fetal movement today but feels this is related to how much pain she is in. I recommended patient present immediately to the MAU for evaluation. Patient plans to drop off older child at school and then proceed to hospital. Encouraged patient to go as soon as possible to be seen.

## 2024-02-28 ENCOUNTER — Ambulatory Visit: Admitting: Family Medicine

## 2024-02-28 ENCOUNTER — Other Ambulatory Visit: Payer: Self-pay

## 2024-02-28 VITALS — BP 125/76 | HR 99 | Wt 146.0 lb

## 2024-02-28 DIAGNOSIS — R1011 Right upper quadrant pain: Secondary | ICD-10-CM

## 2024-02-28 DIAGNOSIS — O10919 Unspecified pre-existing hypertension complicating pregnancy, unspecified trimester: Secondary | ICD-10-CM

## 2024-02-28 DIAGNOSIS — O43122 Velamentous insertion of umbilical cord, second trimester: Secondary | ICD-10-CM | POA: Diagnosis not present

## 2024-02-28 DIAGNOSIS — Z23 Encounter for immunization: Secondary | ICD-10-CM

## 2024-02-28 DIAGNOSIS — O0992 Supervision of high risk pregnancy, unspecified, second trimester: Secondary | ICD-10-CM

## 2024-02-28 DIAGNOSIS — K219 Gastro-esophageal reflux disease without esophagitis: Secondary | ICD-10-CM

## 2024-02-28 DIAGNOSIS — O0990 Supervision of high risk pregnancy, unspecified, unspecified trimester: Secondary | ICD-10-CM

## 2024-02-28 DIAGNOSIS — Z3A27 27 weeks gestation of pregnancy: Secondary | ICD-10-CM

## 2024-02-28 DIAGNOSIS — O10912 Unspecified pre-existing hypertension complicating pregnancy, second trimester: Secondary | ICD-10-CM

## 2024-02-28 DIAGNOSIS — D649 Anemia, unspecified: Secondary | ICD-10-CM

## 2024-02-28 DIAGNOSIS — O09292 Supervision of pregnancy with other poor reproductive or obstetric history, second trimester: Secondary | ICD-10-CM | POA: Diagnosis not present

## 2024-02-28 DIAGNOSIS — O09299 Supervision of pregnancy with other poor reproductive or obstetric history, unspecified trimester: Secondary | ICD-10-CM

## 2024-02-28 MED ORDER — FERROUS SULFATE 325 (65 FE) MG PO TABS: 325.0000 mg | ORAL_TABLET | ORAL | 1 refills | Status: AC

## 2024-02-28 MED ORDER — FAMOTIDINE 20 MG PO TABS: 20.0000 mg | ORAL_TABLET | Freq: Two times a day (BID) | ORAL | 2 refills | Status: AC

## 2024-02-28 NOTE — Progress Notes (Signed)
 "  PRENATAL VISIT NOTE  Subjective:  Janice White is a 34 y.o. G2P1001 at [redacted]w[redacted]d being seen today for ongoing prenatal care.  She is currently monitored for the following issues for this high-risk pregnancy and has Vanishing twin syndrome; Supervision of high risk pregnancy, antepartum; Chronic hypertension during pregnancy, antepartum; History of shoulder dystocia in prior pregnancy, currently pregnant; Alpha thalassemia silent carrier; and Velamentous insertion of umbilical cord in second trimester on their problem list.  Patient reports no complaints.  Contractions: Irritability. Vag. Bleeding: None.  Movement: Present. Denies leaking of fluid.   The following portions of the patient's history were reviewed and updated as appropriate: allergies, current medications, past family history, past medical history, past social history, past surgical history and problem list.   Objective:   Vitals:   02/28/24 1135  BP: 125/76  Pulse: 99  Weight: 146 lb (66.2 kg)    Fetal Status:  Fetal Heart Rate (bpm): 149 Fundal Height: 27 cm Movement: Present    General: Alert, oriented and cooperative. Patient is in no acute distress.  Skin: Skin is warm and dry. No rash noted.   Cardiovascular: Normal heart rate noted  Respiratory: Normal respiratory effort, no problems with respiration noted  Abdomen: Soft, gravid, appropriate for gestational age.  Pain/Pressure: Absent     Pelvic: Cervical exam deferred        Extremities: Normal range of motion.  Edema: None  Mental Status: Normal mood and affect. Normal behavior. Normal judgment and thought content.      11/04/2023    9:25 AM 07/05/2017   11:50 AM 10/29/2016    9:35 AM  Depression screen PHQ 2/9  Decreased Interest 2 0 0  Down, Depressed, Hopeless 0 0 0  PHQ - 2 Score 2 0 0  Altered sleeping 1 1   Tired, decreased energy 2 1   Change in appetite 0 1   Feeling bad or failure about yourself  0 0   Trouble concentrating 0 0   Moving slowly  or fidgety/restless 0 0   Suicidal thoughts 0 0   PHQ-9 Score 5  3       Data saved with a previous flowsheet row definition        11/04/2023    9:25 AM 07/05/2017   11:50 AM 08/06/2016    8:40 AM 06/09/2016    2:16 PM  GAD 7 : Generalized Anxiety Score  Nervous, Anxious, on Edge 0 0 0 0  Control/stop worrying 1 0 0 0  Worry too much - different things 1 0 0 0  Trouble relaxing 1 0 0 0  Restless 0 0 0 0  Easily annoyed or irritable 1 1 0 0  Afraid - awful might happen 0 0 0 0  Total GAD 7 Score 4 1 0 0    Assessment and Plan:  Pregnancy: G2P1001 at [redacted]w[redacted]d 1. Supervision of high risk pregnancy, antepartum (Primary) Continue prenatal care. Needs 28 week labs - Tdap vaccine greater than or equal to 7yo IM  2. Chronic hypertension during pregnancy, antepartum Last u/s for growth @ 30% On Labetalol  with good BP control - US  MFM OB FOLLOW UP; Future  3. Gastroesophageal reflux disease without esophagitis Has some RUQ pain as well. Treat for GERD and check u/s - famotidine  (PEPCID ) 20 MG tablet; Take 1 tablet (20 mg total) by mouth 2 (two) times daily.  Dispense: 60 tablet; Refill: 2  4. History of shoulder dystocia in prior pregnancy, currently pregnant  Consider earlier delivery @ 39 weeks  5. RUQ abdominal pain - US  ABDOMEN LIMITED RUQ (LIVER/GB); Future  6. Velamentous insertion of umbilical cord in second trimester Normal growth  7. Anemia, unspecified type Has some fatigue and last Hgb was 10.1 Check anemia panel next Start some iron - ferrous sulfate  325 (65 FE) MG tablet; Take 1 tablet (325 mg total) by mouth every other day.  Dispense: 45 tablet; Refill: 1  8. [redacted] weeks gestation of pregnancy   Preterm labor symptoms and general obstetric precautions including but not limited to vaginal bleeding, contractions, leaking of fluid and fetal movement were reviewed in detail with the patient. Please refer to After Visit Summary for other counseling recommendations.    Return in about 2 weeks (around 03/13/2024) for HRC, 28 wk labs.  Future Appointments  Date Time Provider Department Center  03/03/2024  8:00 AM [facility] US  4 [id number] [facility] W. [facility]  03/13/2024  9:15 AM Cleatus Moccasin, MD [facility]  03/13/2024  9:40 AM [facility]    Glenys GORMAN Birk, MD  "

## 2024-03-03 ENCOUNTER — Ambulatory Visit: Payer: Self-pay | Admitting: Family Medicine

## 2024-03-03 ENCOUNTER — Ambulatory Visit
Admission: RE | Admit: 2024-03-03 | Discharge: 2024-03-03 | Disposition: A | Discharge status: Home / Self Care | Source: Ambulatory Visit | Attending: Family Medicine | Admitting: Family Medicine

## 2024-03-03 DIAGNOSIS — R1011 Right upper quadrant pain: Secondary | ICD-10-CM

## 2024-03-03 DIAGNOSIS — K8020 Calculus of gallbladder without cholecystitis without obstruction: Secondary | ICD-10-CM

## 2024-03-09 ENCOUNTER — Other Ambulatory Visit: Payer: Self-pay

## 2024-03-09 DIAGNOSIS — O99019 Anemia complicating pregnancy, unspecified trimester: Secondary | ICD-10-CM | POA: Insufficient documentation

## 2024-03-09 DIAGNOSIS — O0990 Supervision of high risk pregnancy, unspecified, unspecified trimester: Secondary | ICD-10-CM

## 2024-03-09 DIAGNOSIS — K8020 Calculus of gallbladder without cholecystitis without obstruction: Secondary | ICD-10-CM | POA: Insufficient documentation

## 2024-03-09 NOTE — Progress Notes (Unsigned)
" ° °  PRENATAL VISIT NOTE  Subjective:  Janice White is a 34 y.o. G2P1001 at 29w***d being seen today for ongoing prenatal care.  She is currently monitored for the following issues for this high-risk pregnancy and has Vanishing twin syndrome; Supervision of high risk pregnancy, antepartum; Chronic hypertension during pregnancy, antepartum; History of shoulder dystocia in prior pregnancy, currently pregnant; Alpha thalassemia silent carrier; Velamentous insertion of umbilical cord in second trimester; and Anemia affecting pregnancy on their problem list.  Patient reports {[id number]}.   .  .   . Denies leaking of fluid.   The following portions of the patient's history were reviewed and updated as appropriate: allergies, current medications, past family history, past medical history, past social history, past surgical history and problem list.   Objective:   There were no vitals filed for this visit.  Fetal Status:           General: Alert, oriented and cooperative. Patient is in no acute distress.  Skin: Skin is warm and dry. No rash noted.   Cardiovascular: Normal heart rate noted  Respiratory: Normal respiratory effort, no problems with respiration noted  Abdomen: Soft, gravid, appropriate for gestational age.        Pelvic: Cervical exam deferred        Extremities: Normal range of motion.     Mental Status: Normal mood and affect. Normal behavior. Normal judgment and thought content.   Assessment and Plan:  Pregnancy: G2P1001 at 29w***d 1. Supervision of high risk pregnancy, antepartum (Primary) 28 wk labs today Has RUQ last time - gall stones seen. Plan is ref to gen surg after delivery.   2. Pregnancy with 29 completed weeks gestation  3. Velamentous insertion of umbilical cord in second trimester Continue serial growth with MFM. Last growth was normal at 30%.  Needs next growth scheduled - ***.    4. Chronic hypertension during pregnancy, antepartum BP well controlled on  labetalol  100 bid.   5. Anemia affecting pregnancy in third trimester Anemia panel today. Iron recommended last time.   6. History of shoulder dystocia in prior pregnancy, currently pregnant Previously counseled on risk of recurrence. Will continue to evaluate overall growth and MOD.   Preterm labor symptoms and general obstetric precautions including but not limited to vaginal bleeding, contractions, leaking of fluid and fetal movement were reviewed in detail with the patient. Please refer to After Visit Summary for other counseling recommendations.   No follow-ups on file.  Future Appointments  Date Time Provider Department Center  03/13/2024  9:15 AM Cleatus Moccasin, MD [facility]  03/13/2024  9:40 AM [facility]    Moccasin Cleatus, MD "

## 2024-03-13 ENCOUNTER — Encounter: Payer: Self-pay | Admitting: Obstetrics and Gynecology

## 2024-03-13 ENCOUNTER — Other Ambulatory Visit: Payer: Self-pay

## 2024-03-13 DIAGNOSIS — O10919 Unspecified pre-existing hypertension complicating pregnancy, unspecified trimester: Secondary | ICD-10-CM

## 2024-03-13 DIAGNOSIS — O09299 Supervision of pregnancy with other poor reproductive or obstetric history, unspecified trimester: Secondary | ICD-10-CM

## 2024-03-13 DIAGNOSIS — O43122 Velamentous insertion of umbilical cord, second trimester: Secondary | ICD-10-CM

## 2024-03-13 DIAGNOSIS — Z3A29 29 weeks gestation of pregnancy: Secondary | ICD-10-CM

## 2024-03-13 DIAGNOSIS — O0990 Supervision of high risk pregnancy, unspecified, unspecified trimester: Secondary | ICD-10-CM

## 2024-03-13 DIAGNOSIS — O99013 Anemia complicating pregnancy, third trimester: Secondary | ICD-10-CM

## 2024-03-13 DIAGNOSIS — K8020 Calculus of gallbladder without cholecystitis without obstruction: Secondary | ICD-10-CM

## 2024-03-15 ENCOUNTER — Encounter: Payer: Self-pay | Admitting: Obstetrics and Gynecology

## 2024-03-16 ENCOUNTER — Encounter: Payer: Self-pay | Admitting: Obstetrics and Gynecology

## 2024-03-16 ENCOUNTER — Other Ambulatory Visit: Payer: Self-pay

## 2024-03-16 ENCOUNTER — Ambulatory Visit: Admitting: Obstetrics and Gynecology

## 2024-03-16 VITALS — BP 131/89 | HR 113 | Wt 147.0 lb

## 2024-03-16 DIAGNOSIS — O99013 Anemia complicating pregnancy, third trimester: Secondary | ICD-10-CM

## 2024-03-16 DIAGNOSIS — O0990 Supervision of high risk pregnancy, unspecified, unspecified trimester: Secondary | ICD-10-CM

## 2024-03-16 DIAGNOSIS — Z3A30 30 weeks gestation of pregnancy: Secondary | ICD-10-CM | POA: Diagnosis not present

## 2024-03-16 DIAGNOSIS — O0993 Supervision of high risk pregnancy, unspecified, third trimester: Secondary | ICD-10-CM

## 2024-03-16 NOTE — Progress Notes (Signed)
" ° °  PRENATAL VISIT NOTE  Subjective:  JOHNANNA White is a 34 y.o. G2P1001 at [redacted]w[redacted]d being seen today for ongoing prenatal care.  She is currently monitored for the following issues for this high-risk pregnancy and has Vanishing twin syndrome; Supervision of high risk pregnancy, antepartum; Chronic hypertension during pregnancy, antepartum; History of shoulder dystocia in prior pregnancy, currently pregnant; Alpha thalassemia silent carrier; Velamentous insertion of umbilical cord in second trimester; Anemia affecting pregnancy; and Gall stones on their problem list.  Patient reports fatigue, aches/pain.  Contractions: Not present. Vag. Bleeding: None.  Movement: Present. Denies leaking of fluid.   The following portions of the patient's history were reviewed and updated as appropriate: allergies, current medications, past family history, past medical history, past social history, past surgical history and problem list.   Objective:   Vitals:   03/16/24 0821  BP: 131/89  Pulse: (!) 113  Weight: 147 lb (66.7 kg)    Fetal Status:  Fetal Heart Rate (bpm): 141 Fundal Height: 30 cm Movement: Present    General: Alert, oriented and cooperative. Patient is in no acute distress.  Skin: Skin is warm and dry. No rash noted.   Cardiovascular: Normal heart rate noted  Respiratory: Normal respiratory effort, no problems with respiration noted  Abdomen: Soft, gravid, appropriate for gestational age.  Pain/Pressure: Absent     Pelvic: Cervical exam deferred        Extremities: Normal range of motion.  Edema: None  Mental Status: Normal mood and affect. Normal behavior. Normal judgment and thought content.   Assessment and Plan:  Pregnancy: G2P1001 at [redacted]w[redacted]d 1. Supervision of high risk pregnancy, antepartum (Primary) 28 wk labs today Has RUQ last time - gall stones seen. Plan is ref to gen surg after delivery.   2. Pregnancy with 29 completed weeks gestation  3. Velamentous insertion of umbilical  cord in second trimester Continue serial growth with MFM. Last growth was normal at 30% - 12/18.  Needs next growth scheduled - will get added onto MFM schedule in next several weeks.  4. Chronic hypertension during pregnancy, antepartum BP well controlled on labetalol  100 bid.   5. Anemia affecting pregnancy in third trimester Anemia panel today. Iron recommended last time.   6. History of shoulder dystocia in prior pregnancy, currently pregnant Previously counseled on risk of recurrence. Will continue to evaluate overall growth and MOD.  She would be interested in 39w IOL assuming not LGA. If LGA, then discuss plan of care.   Preterm labor symptoms and general obstetric precautions including but not limited to vaginal bleeding, contractions, leaking of fluid and fetal movement were reviewed in detail with the patient. Please refer to After Visit Summary for other counseling recommendations.   Return in about 2 weeks (around 03/30/2024) for OB VISIT, MD or APP.  Future Appointments  Date Time Provider Department Center  03/16/2024 10:20 AM [facility]    Vina Solian, MD "

## 2024-03-16 NOTE — Addendum Note (Signed)
 Addended byBETHA BARTHOLOMEW BARMAN on: 03/16/2024 08:56 AM   Modules accepted: Orders

## 2024-03-16 NOTE — Addendum Note (Signed)
 Addended byBETHA BARTHOLOMEW BARMAN on: 03/16/2024 09:24 AM   Modules accepted: Orders

## 2024-03-17 ENCOUNTER — Ambulatory Visit: Payer: Self-pay | Admitting: Obstetrics and Gynecology

## 2024-03-17 DIAGNOSIS — O0990 Supervision of high risk pregnancy, unspecified, unspecified trimester: Secondary | ICD-10-CM

## 2024-03-17 DIAGNOSIS — O99013 Anemia complicating pregnancy, third trimester: Secondary | ICD-10-CM

## 2024-03-17 LAB — HIV ANTIBODY (ROUTINE TESTING W REFLEX): HIV Screen 4th Generation wRfx: NONREACTIVE

## 2024-03-17 LAB — FOLATE: Folate: 12.6 ng/mL

## 2024-03-17 LAB — SYPHILIS: RPR W/REFLEX TO RPR TITER AND TREPONEMAL ANTIBODIES, TRADITIONAL SCREENING AND DIAGNOSIS ALGORITHM: RPR Ser Ql: NONREACTIVE

## 2024-03-17 LAB — CBC
Hematocrit: 32 % — ABNORMAL LOW (ref 34.0–46.6)
Hemoglobin: 10.1 g/dL — ABNORMAL LOW (ref 11.1–15.9)
MCH: 26.4 pg — ABNORMAL LOW (ref 26.6–33.0)
MCHC: 31.6 g/dL (ref 31.5–35.7)
MCV: 84 fL (ref 79–97)
Platelets: 265 10*3/uL (ref 150–450)
RBC: 3.83 x10E6/uL (ref 3.77–5.28)
RDW: 13.1 % (ref 11.7–15.4)
WBC: 10.2 10*3/uL (ref 3.4–10.8)

## 2024-03-17 LAB — GLUCOSE TOLERANCE, 2 HOURS W/ 1HR
Glucose, 1 hour: 148 mg/dL (ref 70–179)
Glucose, 2 hour: 111 mg/dL (ref 70–152)
Glucose, Fasting: 82 mg/dL (ref 70–91)

## 2024-03-17 LAB — IRON,TIBC AND FERRITIN PANEL
Ferritin: 12 ng/mL — ABNORMAL LOW (ref 15–150)
Iron Saturation: 12 % — ABNORMAL LOW (ref 15–55)
Iron: 61 ug/dL (ref 27–159)
Total Iron Binding Capacity: 513 ug/dL — ABNORMAL HIGH (ref 250–450)
UIBC: 452 ug/dL — ABNORMAL HIGH (ref 131–425)

## 2024-03-17 LAB — VITAMIN B12: Vitamin B-12: 463 pg/mL (ref 232–1245)

## 2024-03-30 ENCOUNTER — Encounter: Payer: Self-pay | Admitting: Obstetrics and Gynecology

## 2024-05-23 ENCOUNTER — Inpatient Hospital Stay (HOSPITAL_COMMUNITY): Admit: 2024-05-23
